# Patient Record
Sex: Female | Born: 1965
Health system: Southern US, Community
[De-identification: ages and names within clinical notes are randomized; demographics above are authoritative.]

## PROBLEM LIST (undated history)

## (undated) DIAGNOSIS — Z8619 Personal history of other infectious and parasitic diseases: Secondary | ICD-10-CM

## (undated) DIAGNOSIS — M810 Age-related osteoporosis without current pathological fracture: Secondary | ICD-10-CM

## (undated) DIAGNOSIS — K5090 Crohn's disease, unspecified, without complications: Secondary | ICD-10-CM

## (undated) DIAGNOSIS — B029 Zoster without complications: Secondary | ICD-10-CM

## (undated) DIAGNOSIS — Z9221 Personal history of antineoplastic chemotherapy: Secondary | ICD-10-CM

## (undated) DIAGNOSIS — E781 Pure hyperglyceridemia: Secondary | ICD-10-CM

## (undated) DIAGNOSIS — Z853 Personal history of malignant neoplasm of breast: Secondary | ICD-10-CM

## (undated) DIAGNOSIS — C50919 Malignant neoplasm of unspecified site of unspecified female breast: Secondary | ICD-10-CM

## (undated) DIAGNOSIS — D649 Anemia, unspecified: Secondary | ICD-10-CM

## (undated) DIAGNOSIS — R87610 Atypical squamous cells of undetermined significance on cytologic smear of cervix (ASC-US): Secondary | ICD-10-CM

## (undated) DIAGNOSIS — N950 Postmenopausal bleeding: Secondary | ICD-10-CM

## (undated) DIAGNOSIS — M8580 Other specified disorders of bone density and structure, unspecified site: Secondary | ICD-10-CM

## (undated) DIAGNOSIS — C801 Malignant (primary) neoplasm, unspecified: Secondary | ICD-10-CM

## (undated) DIAGNOSIS — M1990 Unspecified osteoarthritis, unspecified site: Secondary | ICD-10-CM

## (undated) DIAGNOSIS — Z923 Personal history of irradiation: Secondary | ICD-10-CM

## (undated) HISTORY — DX: Pure hyperglyceridemia: E78.1

## (undated) HISTORY — DX: Atypical squamous cells of undetermined significance on cytologic smear of cervix (ASC-US): R87.610

## (undated) HISTORY — PX: COLONOSCOPY: SHX174

## (undated) HISTORY — DX: Other specified disorders of bone density and structure, unspecified site: M85.80

## (undated) HISTORY — DX: Anemia, unspecified: D64.9

## (undated) HISTORY — DX: Unspecified osteoarthritis, unspecified site: M19.90

## (undated) HISTORY — PX: OTHER SURGICAL HISTORY: SHX169

## (undated) HISTORY — DX: Personal history of malignant neoplasm of breast: Z85.3

## (undated) HISTORY — PX: BREAST LUMPECTOMY: SHX2

## (undated) HISTORY — DX: Crohn's disease, unspecified, without complications: K50.90

## (undated) HISTORY — DX: Zoster without complications: B02.9

## (undated) HISTORY — DX: Age-related osteoporosis without current pathological fracture: M81.0

---

## 1998-10-22 ENCOUNTER — Other Ambulatory Visit: Admission: RE | Admit: 1998-10-22 | Discharge: 1998-10-22 | Payer: Self-pay | Admitting: Obstetrics and Gynecology

## 1999-11-01 ENCOUNTER — Other Ambulatory Visit: Admission: RE | Admit: 1999-11-01 | Discharge: 1999-11-01 | Payer: Self-pay | Admitting: Obstetrics and Gynecology

## 2001-01-08 ENCOUNTER — Other Ambulatory Visit: Admission: RE | Admit: 2001-01-08 | Discharge: 2001-01-08 | Payer: Self-pay | Admitting: Obstetrics and Gynecology

## 2001-04-17 ENCOUNTER — Ambulatory Visit (HOSPITAL_COMMUNITY): Admission: RE | Admit: 2001-04-17 | Discharge: 2001-04-17 | Payer: Self-pay | Admitting: Gastroenterology

## 2001-07-09 ENCOUNTER — Encounter (INDEPENDENT_AMBULATORY_CARE_PROVIDER_SITE_OTHER): Payer: Self-pay | Admitting: *Deleted

## 2001-07-09 ENCOUNTER — Ambulatory Visit (HOSPITAL_BASED_OUTPATIENT_CLINIC_OR_DEPARTMENT_OTHER): Admission: RE | Admit: 2001-07-09 | Discharge: 2001-07-09 | Payer: Self-pay | Admitting: *Deleted

## 2002-01-09 ENCOUNTER — Encounter: Payer: Self-pay | Admitting: Gastroenterology

## 2002-01-09 ENCOUNTER — Ambulatory Visit (HOSPITAL_COMMUNITY): Admission: RE | Admit: 2002-01-09 | Discharge: 2002-01-09 | Payer: Self-pay | Admitting: Gastroenterology

## 2002-07-10 ENCOUNTER — Encounter: Payer: Self-pay | Admitting: Gastroenterology

## 2002-07-10 ENCOUNTER — Encounter: Admission: RE | Admit: 2002-07-10 | Discharge: 2002-07-10 | Payer: Self-pay | Admitting: Gastroenterology

## 2002-07-16 ENCOUNTER — Encounter: Admission: RE | Admit: 2002-07-16 | Discharge: 2002-07-16 | Payer: Self-pay | Admitting: Gastroenterology

## 2002-07-16 ENCOUNTER — Encounter: Payer: Self-pay | Admitting: Gastroenterology

## 2002-07-23 ENCOUNTER — Encounter: Admission: RE | Admit: 2002-07-23 | Discharge: 2002-07-23 | Payer: Self-pay | Admitting: Gastroenterology

## 2002-07-23 ENCOUNTER — Encounter: Payer: Self-pay | Admitting: Gastroenterology

## 2002-08-18 ENCOUNTER — Encounter: Payer: Self-pay | Admitting: Surgery

## 2002-08-18 ENCOUNTER — Encounter: Admission: RE | Admit: 2002-08-18 | Discharge: 2002-08-18 | Payer: Self-pay | Admitting: Surgery

## 2002-09-16 ENCOUNTER — Encounter (INDEPENDENT_AMBULATORY_CARE_PROVIDER_SITE_OTHER): Payer: Self-pay

## 2002-09-16 HISTORY — PX: LAPAROSCOPIC CHOLECYSTECTOMY: SUR755

## 2002-09-17 ENCOUNTER — Inpatient Hospital Stay (HOSPITAL_COMMUNITY): Admission: EM | Admit: 2002-09-17 | Discharge: 2002-09-18 | Payer: Self-pay | Admitting: Surgery

## 2003-03-29 ENCOUNTER — Emergency Department (HOSPITAL_COMMUNITY): Admission: EM | Admit: 2003-03-29 | Discharge: 2003-03-30 | Payer: Self-pay | Admitting: Emergency Medicine

## 2003-03-30 ENCOUNTER — Encounter: Payer: Self-pay | Admitting: Emergency Medicine

## 2003-04-02 ENCOUNTER — Encounter: Payer: Self-pay | Admitting: Gastroenterology

## 2003-04-02 ENCOUNTER — Encounter: Admission: RE | Admit: 2003-04-02 | Discharge: 2003-04-02 | Payer: Self-pay | Admitting: Gastroenterology

## 2003-04-10 ENCOUNTER — Encounter: Admission: RE | Admit: 2003-04-10 | Discharge: 2003-04-10 | Payer: Self-pay | Admitting: Gastroenterology

## 2003-04-10 ENCOUNTER — Emergency Department (HOSPITAL_COMMUNITY): Admission: EM | Admit: 2003-04-10 | Discharge: 2003-04-10 | Payer: Self-pay | Admitting: Emergency Medicine

## 2003-04-10 ENCOUNTER — Encounter: Payer: Self-pay | Admitting: Gastroenterology

## 2003-04-14 ENCOUNTER — Inpatient Hospital Stay (HOSPITAL_COMMUNITY): Admission: RE | Admit: 2003-04-14 | Discharge: 2003-04-19 | Payer: Self-pay | Admitting: General Surgery

## 2003-04-14 ENCOUNTER — Encounter (INDEPENDENT_AMBULATORY_CARE_PROVIDER_SITE_OTHER): Payer: Self-pay

## 2003-08-01 DIAGNOSIS — K5090 Crohn's disease, unspecified, without complications: Secondary | ICD-10-CM | POA: Insufficient documentation

## 2003-11-21 DIAGNOSIS — K5090 Crohn's disease, unspecified, without complications: Secondary | ICD-10-CM

## 2003-11-21 HISTORY — DX: Crohn's disease, unspecified, without complications: K50.90

## 2004-02-16 ENCOUNTER — Other Ambulatory Visit: Admission: RE | Admit: 2004-02-16 | Discharge: 2004-02-16 | Payer: Self-pay | Admitting: Obstetrics and Gynecology

## 2005-02-27 ENCOUNTER — Other Ambulatory Visit: Admission: RE | Admit: 2005-02-27 | Discharge: 2005-02-27 | Payer: Self-pay | Admitting: Obstetrics and Gynecology

## 2005-08-24 ENCOUNTER — Encounter: Admission: RE | Admit: 2005-08-24 | Discharge: 2005-08-24 | Payer: Self-pay | Admitting: Obstetrics and Gynecology

## 2005-09-05 ENCOUNTER — Encounter: Admission: RE | Admit: 2005-09-05 | Discharge: 2005-09-05 | Payer: Self-pay | Admitting: Obstetrics and Gynecology

## 2005-11-20 HISTORY — PX: BREAST LUMPECTOMY: SHX2

## 2006-02-22 ENCOUNTER — Encounter: Admission: RE | Admit: 2006-02-22 | Discharge: 2006-02-22 | Payer: Self-pay | Admitting: Obstetrics and Gynecology

## 2006-02-22 ENCOUNTER — Encounter (INDEPENDENT_AMBULATORY_CARE_PROVIDER_SITE_OTHER): Payer: Self-pay | Admitting: *Deleted

## 2006-02-22 ENCOUNTER — Encounter (INDEPENDENT_AMBULATORY_CARE_PROVIDER_SITE_OTHER): Payer: Self-pay | Admitting: Radiology

## 2006-02-22 DIAGNOSIS — C801 Malignant (primary) neoplasm, unspecified: Secondary | ICD-10-CM

## 2006-02-22 HISTORY — DX: Malignant (primary) neoplasm, unspecified: C80.1

## 2006-03-03 ENCOUNTER — Encounter: Admission: RE | Admit: 2006-03-03 | Discharge: 2006-03-03 | Payer: Self-pay | Admitting: Obstetrics and Gynecology

## 2006-03-07 ENCOUNTER — Encounter: Admission: RE | Admit: 2006-03-07 | Discharge: 2006-03-07 | Payer: Self-pay | Admitting: General Surgery

## 2006-03-12 ENCOUNTER — Encounter: Admission: RE | Admit: 2006-03-12 | Discharge: 2006-03-12 | Payer: Self-pay | Admitting: General Surgery

## 2006-03-12 ENCOUNTER — Encounter (INDEPENDENT_AMBULATORY_CARE_PROVIDER_SITE_OTHER): Payer: Self-pay | Admitting: *Deleted

## 2006-03-12 ENCOUNTER — Ambulatory Visit (HOSPITAL_BASED_OUTPATIENT_CLINIC_OR_DEPARTMENT_OTHER): Admission: RE | Admit: 2006-03-12 | Discharge: 2006-03-13 | Payer: Self-pay | Admitting: General Surgery

## 2006-03-12 HISTORY — PX: OTHER SURGICAL HISTORY: SHX169

## 2006-03-13 ENCOUNTER — Ambulatory Visit: Payer: Self-pay | Admitting: Oncology

## 2006-03-13 DIAGNOSIS — Z853 Personal history of malignant neoplasm of breast: Secondary | ICD-10-CM

## 2006-03-13 HISTORY — DX: Personal history of malignant neoplasm of breast: Z85.3

## 2006-03-28 LAB — CBC WITH DIFFERENTIAL/PLATELET
BASO%: 0.5 % (ref 0.0–2.0)
Basophils Absolute: 0 10*3/uL (ref 0.0–0.1)
EOS%: 6.2 % (ref 0.0–7.0)
Eosinophils Absolute: 0.2 10*3/uL (ref 0.0–0.5)
HCT: 33.4 % — ABNORMAL LOW (ref 34.8–46.6)
HGB: 12 g/dL (ref 11.6–15.9)
LYMPH%: 22.9 % (ref 14.0–48.0)
MCH: 38.5 pg — ABNORMAL HIGH (ref 26.0–34.0)
MCHC: 35.8 g/dL (ref 32.0–36.0)
MCV: 107.5 fL — ABNORMAL HIGH (ref 81.0–101.0)
MONO#: 0.2 10*3/uL (ref 0.1–0.9)
MONO%: 6.8 % (ref 0.0–13.0)
NEUT#: 1.8 10*3/uL (ref 1.5–6.5)
NEUT%: 63.6 % (ref 39.6–76.8)
Platelets: 284 10*3/uL (ref 145–400)
RBC: 3.11 10*6/uL — ABNORMAL LOW (ref 3.70–5.32)
RDW: 13.8 % (ref 11.3–14.5)
WBC: 2.8 10*3/uL — ABNORMAL LOW (ref 3.9–10.0)
lymph#: 0.6 10*3/uL — ABNORMAL LOW (ref 0.9–3.3)

## 2006-03-28 LAB — COMPREHENSIVE METABOLIC PANEL
ALT: 15 U/L (ref 0–40)
AST: 15 U/L (ref 0–37)
Albumin: 4.5 g/dL (ref 3.5–5.2)
Alkaline Phosphatase: 48 U/L (ref 39–117)
BUN: 12 mg/dL (ref 6–23)
CO2: 26 mEq/L (ref 19–32)
Calcium: 9.8 mg/dL (ref 8.4–10.5)
Chloride: 103 mEq/L (ref 96–112)
Creatinine, Ser: 0.7 mg/dL (ref 0.4–1.2)
Glucose, Bld: 89 mg/dL (ref 70–99)
Potassium: 4.4 mEq/L (ref 3.5–5.3)
Sodium: 139 mEq/L (ref 135–145)
Total Bilirubin: 1.1 mg/dL (ref 0.3–1.2)
Total Protein: 7.3 g/dL (ref 6.0–8.3)

## 2006-03-28 LAB — CANCER ANTIGEN 27.29: CA 27.29: 21 U/mL (ref 0–39)

## 2006-03-28 LAB — LACTATE DEHYDROGENASE: LDH: 69 U/L — ABNORMAL LOW (ref 94–250)

## 2006-03-29 ENCOUNTER — Other Ambulatory Visit: Admission: RE | Admit: 2006-03-29 | Discharge: 2006-03-29 | Payer: Self-pay | Admitting: Obstetrics and Gynecology

## 2006-04-05 ENCOUNTER — Encounter (INDEPENDENT_AMBULATORY_CARE_PROVIDER_SITE_OTHER): Payer: Self-pay | Admitting: *Deleted

## 2006-04-05 ENCOUNTER — Ambulatory Visit: Admission: RE | Admit: 2006-04-05 | Discharge: 2006-04-05 | Payer: Self-pay | Admitting: Oncology

## 2006-04-05 ENCOUNTER — Ambulatory Visit (HOSPITAL_COMMUNITY): Admission: RE | Admit: 2006-04-05 | Discharge: 2006-04-05 | Payer: Self-pay | Admitting: Oncology

## 2006-04-05 HISTORY — PX: TRANSTHORACIC ECHOCARDIOGRAM: SHX275

## 2006-04-10 LAB — CBC WITH DIFFERENTIAL/PLATELET
BASO%: 0.3 % (ref 0.0–2.0)
Basophils Absolute: 0 10*3/uL (ref 0.0–0.1)
EOS%: 3.8 % (ref 0.0–7.0)
Eosinophils Absolute: 0.1 10*3/uL (ref 0.0–0.5)
HCT: 35.8 % (ref 34.8–46.6)
HGB: 12.8 g/dL (ref 11.6–15.9)
LYMPH%: 24.3 % (ref 14.0–48.0)
MCH: 37.8 pg — ABNORMAL HIGH (ref 26.0–34.0)
MCHC: 35.6 g/dL (ref 32.0–36.0)
MCV: 106.1 fL — ABNORMAL HIGH (ref 81.0–101.0)
MONO#: 0.3 10*3/uL (ref 0.1–0.9)
MONO%: 8.3 % (ref 0.0–13.0)
NEUT#: 2.3 10*3/uL (ref 1.5–6.5)
NEUT%: 63.3 % (ref 39.6–76.8)
Platelets: 210 10*3/uL (ref 145–400)
RBC: 3.37 10*6/uL — ABNORMAL LOW (ref 3.70–5.32)
RDW: 13.6 % (ref 11.3–14.5)
WBC: 3.6 10*3/uL — ABNORMAL LOW (ref 3.9–10.0)
lymph#: 0.9 10*3/uL (ref 0.9–3.3)

## 2006-04-10 LAB — COMPREHENSIVE METABOLIC PANEL
ALT: 28 U/L (ref 0–40)
AST: 24 U/L (ref 0–37)
Albumin: 4.7 g/dL (ref 3.5–5.2)
Alkaline Phosphatase: 64 U/L (ref 39–117)
BUN: 10 mg/dL (ref 6–23)
CO2: 28 mEq/L (ref 19–32)
Calcium: 9.4 mg/dL (ref 8.4–10.5)
Chloride: 104 mEq/L (ref 96–112)
Creatinine, Ser: 0.7 mg/dL (ref 0.4–1.2)
Glucose, Bld: 90 mg/dL (ref 70–99)
Potassium: 4.2 mEq/L (ref 3.5–5.3)
Sodium: 139 mEq/L (ref 135–145)
Total Bilirubin: 1 mg/dL (ref 0.3–1.2)
Total Protein: 7.6 g/dL (ref 6.0–8.3)

## 2006-04-10 LAB — CANCER ANTIGEN 27.29: CA 27.29: 22 U/mL (ref 0–39)

## 2006-04-10 LAB — LACTATE DEHYDROGENASE: LDH: 85 U/L — ABNORMAL LOW (ref 94–250)

## 2006-04-12 ENCOUNTER — Ambulatory Visit (HOSPITAL_BASED_OUTPATIENT_CLINIC_OR_DEPARTMENT_OTHER): Admission: RE | Admit: 2006-04-12 | Discharge: 2006-04-12 | Payer: Self-pay | Admitting: General Surgery

## 2006-04-23 LAB — CBC WITH DIFFERENTIAL/PLATELET
BASO%: 1 % (ref 0.0–2.0)
Basophils Absolute: 0 10*3/uL (ref 0.0–0.1)
EOS%: 11.5 % — ABNORMAL HIGH (ref 0.0–7.0)
Eosinophils Absolute: 0.1 10*3/uL (ref 0.0–0.5)
HCT: 30.9 % — ABNORMAL LOW (ref 34.8–46.6)
HGB: 11.1 g/dL — ABNORMAL LOW (ref 11.6–15.9)
LYMPH%: 51.3 % — ABNORMAL HIGH (ref 14.0–48.0)
MCH: 37.3 pg — ABNORMAL HIGH (ref 26.0–34.0)
MCHC: 35.9 g/dL (ref 32.0–36.0)
MCV: 103.9 fL — ABNORMAL HIGH (ref 81.0–101.0)
MONO#: 0 10*3/uL — ABNORMAL LOW (ref 0.1–0.9)
MONO%: 7.1 % (ref 0.0–13.0)
NEUT#: 0.2 10*3/uL — CL (ref 1.5–6.5)
NEUT%: 29.1 % — ABNORMAL LOW (ref 39.6–76.8)
Platelets: 75 10*3/uL — ABNORMAL LOW (ref 145–400)
RBC: 2.97 10*6/uL — ABNORMAL LOW (ref 3.70–5.32)
RDW: 12.4 % (ref 11.3–14.5)
WBC: 0.6 10*3/uL — CL (ref 3.9–10.0)
lymph#: 0.3 10*3/uL — ABNORMAL LOW (ref 0.9–3.3)

## 2006-04-30 ENCOUNTER — Ambulatory Visit: Payer: Self-pay | Admitting: Oncology

## 2006-04-30 LAB — CBC WITH DIFFERENTIAL/PLATELET
BASO%: 0.3 % (ref 0.0–2.0)
Basophils Absolute: 0 10*3/uL (ref 0.0–0.1)
EOS%: 2.5 % (ref 0.0–7.0)
Eosinophils Absolute: 0.1 10*3/uL (ref 0.0–0.5)
HCT: 30.6 % — ABNORMAL LOW (ref 34.8–46.6)
HGB: 11 g/dL — ABNORMAL LOW (ref 11.6–15.9)
LYMPH%: 13.8 % — ABNORMAL LOW (ref 14.0–48.0)
MCH: 36.8 pg — ABNORMAL HIGH (ref 26.0–34.0)
MCHC: 35.9 g/dL (ref 32.0–36.0)
MCV: 102.3 fL — ABNORMAL HIGH (ref 81.0–101.0)
MONO#: 0.4 10*3/uL (ref 0.1–0.9)
MONO%: 7.9 % (ref 0.0–13.0)
NEUT#: 3.8 10*3/uL (ref 1.5–6.5)
NEUT%: 75.5 % (ref 39.6–76.8)
Platelets: 164 10*3/uL (ref 145–400)
RBC: 2.99 10*6/uL — ABNORMAL LOW (ref 3.70–5.32)
RDW: 12.2 % (ref 11.3–14.5)
WBC: 5.1 10*3/uL (ref 3.9–10.0)
lymph#: 0.7 10*3/uL — ABNORMAL LOW (ref 0.9–3.3)

## 2006-05-07 LAB — CBC WITH DIFFERENTIAL/PLATELET
BASO%: 1.2 % (ref 0.0–2.0)
Basophils Absolute: 0 10*3/uL (ref 0.0–0.1)
EOS%: 5.3 % (ref 0.0–7.0)
Eosinophils Absolute: 0 10*3/uL (ref 0.0–0.5)
HCT: 27.5 % — ABNORMAL LOW (ref 34.8–46.6)
HGB: 10 g/dL — ABNORMAL LOW (ref 11.6–15.9)
LYMPH%: 35.2 % (ref 14.0–48.0)
MCH: 37.1 pg — ABNORMAL HIGH (ref 26.0–34.0)
MCHC: 36.4 g/dL — ABNORMAL HIGH (ref 32.0–36.0)
MCV: 101.7 fL — ABNORMAL HIGH (ref 81.0–101.0)
MONO#: 0.1 10*3/uL (ref 0.1–0.9)
MONO%: 9.6 % (ref 0.0–13.0)
NEUT#: 0.3 10*3/uL — CL (ref 1.5–6.5)
NEUT%: 48.7 % (ref 39.6–76.8)
Platelets: 178 10*3/uL (ref 145–400)
RBC: 2.71 10*6/uL — ABNORMAL LOW (ref 3.70–5.32)
RDW: 12.7 % (ref 11.3–14.5)
WBC: 0.7 10*3/uL — CL (ref 3.9–10.0)
lymph#: 0.2 10*3/uL — ABNORMAL LOW (ref 0.9–3.3)

## 2006-05-14 LAB — CBC WITH DIFFERENTIAL/PLATELET
BASO%: 0 % (ref 0.0–2.0)
Basophils Absolute: 0 10*3/uL (ref 0.0–0.1)
EOS%: 0.3 % (ref 0.0–7.0)
Eosinophils Absolute: 0 10*3/uL (ref 0.0–0.5)
HCT: 24.3 % — ABNORMAL LOW (ref 34.8–46.6)
HGB: 8.8 g/dL — ABNORMAL LOW (ref 11.6–15.9)
LYMPH%: 8.7 % — ABNORMAL LOW (ref 14.0–48.0)
MCH: 37.1 pg — ABNORMAL HIGH (ref 26.0–34.0)
MCHC: 36.2 g/dL — ABNORMAL HIGH (ref 32.0–36.0)
MCV: 102.4 fL — ABNORMAL HIGH (ref 81.0–101.0)
MONO#: 0.4 10*3/uL (ref 0.1–0.9)
MONO%: 6.1 % (ref 0.0–13.0)
NEUT#: 5.8 10*3/uL (ref 1.5–6.5)
NEUT%: 84.9 % — ABNORMAL HIGH (ref 39.6–76.8)
Platelets: 98 10*3/uL — ABNORMAL LOW (ref 145–400)
RBC: 2.37 10*6/uL — ABNORMAL LOW (ref 3.70–5.32)
RDW: 12.4 % (ref 11.3–14.5)
WBC: 6.8 10*3/uL (ref 3.9–10.0)
lymph#: 0.6 10*3/uL — ABNORMAL LOW (ref 0.9–3.3)

## 2006-05-15 LAB — CBC WITH DIFFERENTIAL/PLATELET
BASO%: 0.2 % (ref 0.0–2.0)
Basophils Absolute: 0 10*3/uL (ref 0.0–0.1)
EOS%: 0.3 % (ref 0.0–7.0)
Eosinophils Absolute: 0 10*3/uL (ref 0.0–0.5)
HCT: 25.3 % — ABNORMAL LOW (ref 34.8–46.6)
HGB: 9.1 g/dL — ABNORMAL LOW (ref 11.6–15.9)
LYMPH%: 8.4 % — ABNORMAL LOW (ref 14.0–48.0)
MCH: 36.4 pg — ABNORMAL HIGH (ref 26.0–34.0)
MCHC: 35.8 g/dL (ref 32.0–36.0)
MCV: 101.9 fL — ABNORMAL HIGH (ref 81.0–101.0)
MONO#: 0.3 10*3/uL (ref 0.1–0.9)
MONO%: 5.7 % (ref 0.0–13.0)
NEUT#: 5.1 10*3/uL (ref 1.5–6.5)
NEUT%: 85.4 % — ABNORMAL HIGH (ref 39.6–76.8)
Platelets: 112 10*3/uL — ABNORMAL LOW (ref 145–400)
RBC: 2.48 10*6/uL — ABNORMAL LOW (ref 3.70–5.32)
RDW: 12.4 % (ref 11.3–14.5)
WBC: 6 10*3/uL (ref 3.9–10.0)
lymph#: 0.5 10*3/uL — ABNORMAL LOW (ref 0.9–3.3)

## 2006-05-21 LAB — CBC WITH DIFFERENTIAL/PLATELET
BASO%: 0.6 % (ref 0.0–2.0)
Basophils Absolute: 0 10*3/uL (ref 0.0–0.1)
EOS%: 1.5 % (ref 0.0–7.0)
Eosinophils Absolute: 0 10*3/uL (ref 0.0–0.5)
HCT: UNDETERMINED % (ref 34.8–46.6)
HGB: 8.5 g/dL — ABNORMAL LOW (ref 11.6–15.9)
LYMPH%: 19.6 % (ref 14.0–48.0)
MCH: UNDETERMINED pg (ref 26.0–34.0)
MCHC: UNDETERMINED g/dL (ref 32.0–36.0)
MCV: UNDETERMINED fL (ref 81.0–101.0)
MONO#: 0 10*3/uL — ABNORMAL LOW (ref 0.1–0.9)
MONO%: 5.3 % (ref 0.0–13.0)
NEUT#: 0.7 10*3/uL — ABNORMAL LOW (ref 1.5–6.5)
NEUT%: 73 % (ref 39.6–76.8)
Platelets: 141 10*3/uL — ABNORMAL LOW (ref 145–400)
RBC: UNDETERMINED 10*6/uL (ref 3.70–5.32)
RDW: UNDETERMINED % (ref 11.3–14.5)
WBC: 0.9 10*3/uL — CL (ref 3.9–10.0)
lymph#: 0.2 10*3/uL — ABNORMAL LOW (ref 0.9–3.3)

## 2006-05-28 LAB — CBC WITH DIFFERENTIAL/PLATELET
BASO%: 1.2 % (ref 0.0–2.0)
Basophils Absolute: 0.1 10*3/uL (ref 0.0–0.1)
EOS%: 0.5 % (ref 0.0–7.0)
Eosinophils Absolute: 0 10*3/uL (ref 0.0–0.5)
HCT: 24.2 % — ABNORMAL LOW (ref 34.8–46.6)
HGB: 8.8 g/dL — ABNORMAL LOW (ref 11.6–15.9)
LYMPH%: 8.4 % — ABNORMAL LOW (ref 14.0–48.0)
MCH: 36.3 pg — ABNORMAL HIGH (ref 26.0–34.0)
MCHC: 36.5 g/dL — ABNORMAL HIGH (ref 32.0–36.0)
MCV: 99.3 fL (ref 81.0–101.0)
MONO#: 0.6 10*3/uL (ref 0.1–0.9)
MONO%: 8.7 % (ref 0.0–13.0)
NEUT#: 5.6 10*3/uL (ref 1.5–6.5)
NEUT%: 81.2 % — ABNORMAL HIGH (ref 39.6–76.8)
Platelets: 137 10*3/uL — ABNORMAL LOW (ref 145–400)
RBC: 2.43 10*6/uL — ABNORMAL LOW (ref 3.70–5.32)
RDW: 17.8 % — ABNORMAL HIGH (ref 11.3–14.5)
WBC: 6.9 10*3/uL (ref 3.9–10.0)
lymph#: 0.6 10*3/uL — ABNORMAL LOW (ref 0.9–3.3)

## 2006-05-28 LAB — TECHNOLOGIST REVIEW: Technologist Review: 1

## 2006-06-04 LAB — CBC WITH DIFFERENTIAL/PLATELET
BASO%: 0 % (ref 0.0–2.0)
Basophils Absolute: 0 10*3/uL (ref 0.0–0.1)
EOS%: 0.9 % (ref 0.0–7.0)
Eosinophils Absolute: 0 10*3/uL (ref 0.0–0.5)
HCT: 25 % — ABNORMAL LOW (ref 34.8–46.6)
HGB: 9 g/dL — ABNORMAL LOW (ref 11.6–15.9)
LYMPH%: 6.7 % — ABNORMAL LOW (ref 14.0–48.0)
MCH: 36.8 pg — ABNORMAL HIGH (ref 26.0–34.0)
MCHC: 36 g/dL (ref 32.0–36.0)
MCV: 102.1 fL — ABNORMAL HIGH (ref 81.0–101.0)
MONO#: 0 10*3/uL — ABNORMAL LOW (ref 0.1–0.9)
MONO%: 1.4 % (ref 0.0–13.0)
NEUT#: 2 10*3/uL (ref 1.5–6.5)
NEUT%: 91 % — ABNORMAL HIGH (ref 39.6–76.8)
Platelets: 121 10*3/uL — ABNORMAL LOW (ref 145–400)
RBC: 2.45 10*6/uL — ABNORMAL LOW (ref 3.70–5.32)
RDW: 17.7 % — ABNORMAL HIGH (ref 11.3–14.5)
WBC: 2.2 10*3/uL — ABNORMAL LOW (ref 3.9–10.0)
lymph#: 0.1 10*3/uL — ABNORMAL LOW (ref 0.9–3.3)

## 2006-06-06 LAB — FOLLICLE STIMULATING HORMONE: FSH: 22.3 m[IU]/mL

## 2006-06-06 LAB — COMPREHENSIVE METABOLIC PANEL
ALT: 12 U/L (ref 0–40)
AST: 15 U/L (ref 0–37)
Albumin: 4.4 g/dL (ref 3.5–5.2)
Alkaline Phosphatase: 77 U/L (ref 39–117)
BUN: 11 mg/dL (ref 6–23)
CO2: 27 mEq/L (ref 19–32)
Calcium: 9.3 mg/dL (ref 8.4–10.5)
Chloride: 105 mEq/L (ref 96–112)
Creatinine, Ser: 0.66 mg/dL (ref 0.40–1.20)
Glucose, Bld: 95 mg/dL (ref 70–99)
Potassium: 4.2 mEq/L (ref 3.5–5.3)
Sodium: 139 mEq/L (ref 135–145)
Total Bilirubin: 0.4 mg/dL (ref 0.3–1.2)
Total Protein: 6.9 g/dL (ref 6.0–8.3)

## 2006-06-06 LAB — ESTRADIOL, ULTRA SENS: Estradiol, Ultra Sensitive: 107 pg/mL

## 2006-06-18 ENCOUNTER — Ambulatory Visit: Payer: Self-pay | Admitting: Oncology

## 2006-06-18 LAB — CBC WITH DIFFERENTIAL/PLATELET
BASO%: 1.7 % (ref 0.0–2.0)
Basophils Absolute: 0.1 10*3/uL (ref 0.0–0.1)
EOS%: 1.5 % (ref 0.0–7.0)
Eosinophils Absolute: 0 10*3/uL (ref 0.0–0.5)
HCT: 27.2 % — ABNORMAL LOW (ref 34.8–46.6)
HGB: 9.4 g/dL — ABNORMAL LOW (ref 11.6–15.9)
LYMPH%: 7.7 % — ABNORMAL LOW (ref 14.0–48.0)
MCH: 35.1 pg — ABNORMAL HIGH (ref 26.0–34.0)
MCHC: 34.6 g/dL (ref 32.0–36.0)
MCV: 101 fL (ref 81.0–101.0)
MONO#: 0.7 10*3/uL (ref 0.1–0.9)
MONO%: 20.5 % — ABNORMAL HIGH (ref 0.0–13.0)
NEUT#: 2.2 10*3/uL (ref 1.5–6.5)
NEUT%: 68.6 % (ref 39.6–76.8)
Platelets: 157 10*3/uL (ref 145–400)
RBC: 2.68 10*6/uL — ABNORMAL LOW (ref 3.70–5.32)
RDW: 16.7 % — ABNORMAL HIGH (ref 11.3–14.5)
WBC: 3.2 10*3/uL — ABNORMAL LOW (ref 3.9–10.0)
lymph#: 0.2 10*3/uL — ABNORMAL LOW (ref 0.9–3.3)

## 2006-06-25 LAB — CBC WITH DIFFERENTIAL/PLATELET
BASO%: 1.5 % (ref 0.0–2.0)
Basophils Absolute: 0 10*3/uL (ref 0.0–0.1)
EOS%: 3.5 % (ref 0.0–7.0)
Eosinophils Absolute: 0.1 10*3/uL (ref 0.0–0.5)
HCT: 28.9 % — ABNORMAL LOW (ref 34.8–46.6)
HGB: 10.1 g/dL — ABNORMAL LOW (ref 11.6–15.9)
LYMPH%: 15.8 % (ref 14.0–48.0)
MCH: 34.9 pg — ABNORMAL HIGH (ref 26.0–34.0)
MCHC: 34.9 g/dL (ref 32.0–36.0)
MCV: 99.9 fL (ref 81.0–101.0)
MONO#: 0.3 10*3/uL (ref 0.1–0.9)
MONO%: 11.6 % (ref 0.0–13.0)
NEUT#: 1.9 10*3/uL (ref 1.5–6.5)
NEUT%: 67.6 % (ref 39.6–76.8)
Platelets: 170 10*3/uL (ref 145–400)
RBC: 2.89 10*6/uL — ABNORMAL LOW (ref 3.70–5.32)
RDW: 14.8 % — ABNORMAL HIGH (ref 11.3–14.5)
WBC: 2.8 10*3/uL — ABNORMAL LOW (ref 3.9–10.0)
lymph#: 0.4 10*3/uL — ABNORMAL LOW (ref 0.9–3.3)

## 2006-07-02 LAB — CBC WITH DIFFERENTIAL/PLATELET
BASO%: 2 % (ref 0.0–2.0)
Basophils Absolute: 0.1 10*3/uL (ref 0.0–0.1)
EOS%: 7 % (ref 0.0–7.0)
Eosinophils Absolute: 0.2 10*3/uL (ref 0.0–0.5)
HCT: 31.9 % — ABNORMAL LOW (ref 34.8–46.6)
HGB: 10.9 g/dL — ABNORMAL LOW (ref 11.6–15.9)
LYMPH%: 13.8 % — ABNORMAL LOW (ref 14.0–48.0)
MCH: 33.9 pg (ref 26.0–34.0)
MCHC: 34 g/dL (ref 32.0–36.0)
MCV: 99.6 fL (ref 81.0–101.0)
MONO#: 0.2 10*3/uL (ref 0.1–0.9)
MONO%: 8.1 % (ref 0.0–13.0)
NEUT#: 2.1 10*3/uL (ref 1.5–6.5)
NEUT%: 69.1 % (ref 39.6–76.8)
Platelets: 143 10*3/uL — ABNORMAL LOW (ref 145–400)
RBC: 3.21 10*6/uL — ABNORMAL LOW (ref 3.70–5.32)
RDW: 13.9 % (ref 11.3–14.5)
WBC: 3 10*3/uL — ABNORMAL LOW (ref 3.9–10.0)
lymph#: 0.4 10*3/uL — ABNORMAL LOW (ref 0.9–3.3)

## 2006-07-09 LAB — CBC WITH DIFFERENTIAL/PLATELET
BASO%: 0.4 % (ref 0.0–2.0)
Basophils Absolute: 0 10*3/uL (ref 0.0–0.1)
EOS%: 5.7 % (ref 0.0–7.0)
Eosinophils Absolute: 0.2 10*3/uL (ref 0.0–0.5)
HCT: 30.1 % — ABNORMAL LOW (ref 34.8–46.6)
HGB: 10.6 g/dL — ABNORMAL LOW (ref 11.6–15.9)
LYMPH%: 11.9 % — ABNORMAL LOW (ref 14.0–48.0)
MCH: 35 pg — ABNORMAL HIGH (ref 26.0–34.0)
MCHC: 35 g/dL (ref 32.0–36.0)
MCV: 99.8 fL (ref 81.0–101.0)
MONO#: 0.1 10*3/uL (ref 0.1–0.9)
MONO%: 4.6 % (ref 0.0–13.0)
NEUT#: 2.1 10*3/uL (ref 1.5–6.5)
NEUT%: 77.4 % — ABNORMAL HIGH (ref 39.6–76.8)
Platelets: 119 10*3/uL — ABNORMAL LOW (ref 145–400)
RBC: 3.02 10*6/uL — ABNORMAL LOW (ref 3.70–5.32)
RDW: 16.9 % — ABNORMAL HIGH (ref 11.3–14.5)
WBC: 2.7 10*3/uL — ABNORMAL LOW (ref 3.9–10.0)
lymph#: 0.3 10*3/uL — ABNORMAL LOW (ref 0.9–3.3)

## 2006-07-16 LAB — CBC WITH DIFFERENTIAL/PLATELET
BASO%: 0.7 % (ref 0.0–2.0)
Basophils Absolute: 0 10*3/uL (ref 0.0–0.1)
EOS%: 8.7 % — ABNORMAL HIGH (ref 0.0–7.0)
Eosinophils Absolute: 0.2 10*3/uL (ref 0.0–0.5)
HCT: 33.3 % — ABNORMAL LOW (ref 34.8–46.6)
HGB: 11.7 g/dL (ref 11.6–15.9)
LYMPH%: 17.4 % (ref 14.0–48.0)
MCH: 34.7 pg — ABNORMAL HIGH (ref 26.0–34.0)
MCHC: 35.1 g/dL (ref 32.0–36.0)
MCV: 98.8 fL (ref 81.0–101.0)
MONO#: 0.2 10*3/uL (ref 0.1–0.9)
MONO%: 8.4 % (ref 0.0–13.0)
NEUT#: 1.4 10*3/uL — ABNORMAL LOW (ref 1.5–6.5)
NEUT%: 64.8 % (ref 39.6–76.8)
Platelets: 142 10*3/uL — ABNORMAL LOW (ref 145–400)
RBC: 3.37 10*6/uL — ABNORMAL LOW (ref 3.70–5.32)
RDW: 16.6 % — ABNORMAL HIGH (ref 11.3–14.5)
WBC: 2.1 10*3/uL — ABNORMAL LOW (ref 3.9–10.0)
lymph#: 0.4 10*3/uL — ABNORMAL LOW (ref 0.9–3.3)

## 2006-07-24 LAB — CBC WITH DIFFERENTIAL/PLATELET
BASO%: 0.8 % (ref 0.0–2.0)
Basophils Absolute: 0 10*3/uL (ref 0.0–0.1)
EOS%: 6.8 % (ref 0.0–7.0)
Eosinophils Absolute: 0.2 10*3/uL (ref 0.0–0.5)
HCT: 35 % (ref 34.8–46.6)
HGB: 12.2 g/dL (ref 11.6–15.9)
LYMPH%: 19.2 % (ref 14.0–48.0)
MCH: 33.6 pg (ref 26.0–34.0)
MCHC: 34.7 g/dL (ref 32.0–36.0)
MCV: 96.6 fL (ref 81.0–101.0)
MONO#: 0.2 10*3/uL (ref 0.1–0.9)
MONO%: 9.7 % (ref 0.0–13.0)
NEUT#: 1.4 10*3/uL — ABNORMAL LOW (ref 1.5–6.5)
NEUT%: 63.6 % (ref 39.6–76.8)
Platelets: 145 10*3/uL (ref 145–400)
RBC: 3.63 10*6/uL — ABNORMAL LOW (ref 3.70–5.32)
RDW: 13.5 % (ref 11.3–14.5)
WBC: 2.2 10*3/uL — ABNORMAL LOW (ref 3.9–10.0)
lymph#: 0.4 10*3/uL — ABNORMAL LOW (ref 0.9–3.3)

## 2006-07-30 LAB — CBC WITH DIFFERENTIAL/PLATELET
BASO%: 1.3 % (ref 0.0–2.0)
Basophils Absolute: 0 10*3/uL (ref 0.0–0.1)
EOS%: 3.9 % (ref 0.0–7.0)
Eosinophils Absolute: 0.1 10*3/uL (ref 0.0–0.5)
HCT: 33.4 % — ABNORMAL LOW (ref 34.8–46.6)
HGB: 11.9 g/dL (ref 11.6–15.9)
LYMPH%: 22.3 % (ref 14.0–48.0)
MCH: 33.7 pg (ref 26.0–34.0)
MCHC: 35.7 g/dL (ref 32.0–36.0)
MCV: 94.6 fL (ref 81.0–101.0)
MONO#: 0.1 10*3/uL (ref 0.1–0.9)
MONO%: 5.5 % (ref 0.0–13.0)
NEUT#: 1.3 10*3/uL — ABNORMAL LOW (ref 1.5–6.5)
NEUT%: 67 % (ref 39.6–76.8)
Platelets: 116 10*3/uL — ABNORMAL LOW (ref 145–400)
RBC: 3.53 10*6/uL — ABNORMAL LOW (ref 3.70–5.32)
RDW: 12.6 % (ref 11.3–14.5)
WBC: 2 10*3/uL — ABNORMAL LOW (ref 3.9–10.0)
lymph#: 0.4 10*3/uL — ABNORMAL LOW (ref 0.9–3.3)

## 2006-08-03 ENCOUNTER — Ambulatory Visit: Payer: Self-pay | Admitting: Oncology

## 2006-08-06 LAB — CBC WITH DIFFERENTIAL/PLATELET
BASO%: 1.4 % (ref 0.0–2.0)
Basophils Absolute: 0 10*3/uL (ref 0.0–0.1)
EOS%: 3.9 % (ref 0.0–7.0)
Eosinophils Absolute: 0.1 10*3/uL (ref 0.0–0.5)
HCT: 36.5 % (ref 34.8–46.6)
HGB: 12.8 g/dL (ref 11.6–15.9)
LYMPH%: 19.4 % (ref 14.0–48.0)
MCH: 33.1 pg (ref 26.0–34.0)
MCHC: 35.2 g/dL (ref 32.0–36.0)
MCV: 94.2 fL (ref 81.0–101.0)
MONO#: 0.2 10*3/uL (ref 0.1–0.9)
MONO%: 8.5 % (ref 0.0–13.0)
NEUT#: 1.6 10*3/uL (ref 1.5–6.5)
NEUT%: 66.8 % (ref 39.6–76.8)
Platelets: 147 10*3/uL (ref 145–400)
RBC: 3.87 10*6/uL (ref 3.70–5.32)
RDW: 13 % (ref 11.3–14.5)
WBC: 2.4 10*3/uL — ABNORMAL LOW (ref 3.9–10.0)
lymph#: 0.5 10*3/uL — ABNORMAL LOW (ref 0.9–3.3)

## 2006-08-13 LAB — CBC WITH DIFFERENTIAL/PLATELET
BASO%: 1.4 % (ref 0.0–2.0)
Basophils Absolute: 0 10*3/uL (ref 0.0–0.1)
EOS%: 3 % (ref 0.0–7.0)
Eosinophils Absolute: 0.1 10*3/uL (ref 0.0–0.5)
HCT: 35.1 % (ref 34.8–46.6)
HGB: 12.7 g/dL (ref 11.6–15.9)
LYMPH%: 21.4 % (ref 14.0–48.0)
MCH: 33.1 pg (ref 26.0–34.0)
MCHC: 36.1 g/dL — ABNORMAL HIGH (ref 32.0–36.0)
MCV: 91.6 fL (ref 81.0–101.0)
MONO#: 0.2 10*3/uL (ref 0.1–0.9)
MONO%: 7.3 % (ref 0.0–13.0)
NEUT#: 1.5 10*3/uL (ref 1.5–6.5)
NEUT%: 67 % (ref 39.6–76.8)
Platelets: 137 10*3/uL — ABNORMAL LOW (ref 145–400)
RBC: 3.83 10*6/uL (ref 3.70–5.32)
RDW: 12.9 % (ref 11.3–14.5)
WBC: 2.2 10*3/uL — ABNORMAL LOW (ref 3.9–10.0)
lymph#: 0.5 10*3/uL — ABNORMAL LOW (ref 0.9–3.3)

## 2006-08-20 LAB — CBC WITH DIFFERENTIAL/PLATELET
BASO%: 1.6 % (ref 0.0–2.0)
Basophils Absolute: 0 10*3/uL (ref 0.0–0.1)
EOS%: 3.6 % (ref 0.0–7.0)
Eosinophils Absolute: 0.1 10*3/uL (ref 0.0–0.5)
HCT: 33.4 % — ABNORMAL LOW (ref 34.8–46.6)
HGB: 12 g/dL (ref 11.6–15.9)
LYMPH%: 21.8 % (ref 14.0–48.0)
MCH: 32.5 pg (ref 26.0–34.0)
MCHC: 35.9 g/dL (ref 32.0–36.0)
MCV: 90.6 fL (ref 81.0–101.0)
MONO#: 0.2 10*3/uL (ref 0.1–0.9)
MONO%: 8.7 % (ref 0.0–13.0)
NEUT#: 1.2 10*3/uL — ABNORMAL LOW (ref 1.5–6.5)
NEUT%: 64.3 % (ref 39.6–76.8)
Platelets: 136 10*3/uL — ABNORMAL LOW (ref 145–400)
RBC: 3.69 10*6/uL — ABNORMAL LOW (ref 3.70–5.32)
RDW: 12.4 % (ref 11.3–14.5)
WBC: 1.9 10*3/uL — ABNORMAL LOW (ref 3.9–10.0)
lymph#: 0.4 10*3/uL — ABNORMAL LOW (ref 0.9–3.3)

## 2006-08-27 LAB — CBC WITH DIFFERENTIAL/PLATELET
BASO%: 1.1 % (ref 0.0–2.0)
Basophils Absolute: 0 10*3/uL (ref 0.0–0.1)
EOS%: 3.4 % (ref 0.0–7.0)
Eosinophils Absolute: 0.1 10*3/uL (ref 0.0–0.5)
HCT: 33 % — ABNORMAL LOW (ref 34.8–46.6)
HGB: 12 g/dL (ref 11.6–15.9)
LYMPH%: 18.1 % (ref 14.0–48.0)
MCH: 32.9 pg (ref 26.0–34.0)
MCHC: 36.3 g/dL — ABNORMAL HIGH (ref 32.0–36.0)
MCV: 90.6 fL (ref 81.0–101.0)
MONO#: 0.2 10*3/uL (ref 0.1–0.9)
MONO%: 8.2 % (ref 0.0–13.0)
NEUT#: 1.4 10*3/uL — ABNORMAL LOW (ref 1.5–6.5)
NEUT%: 69.2 % (ref 39.6–76.8)
Platelets: 156 10*3/uL (ref 145–400)
RBC: 3.64 10*6/uL — ABNORMAL LOW (ref 3.70–5.32)
RDW: 12.6 % (ref 11.3–14.5)
WBC: 2 10*3/uL — ABNORMAL LOW (ref 3.9–10.0)
lymph#: 0.4 10*3/uL — ABNORMAL LOW (ref 0.9–3.3)

## 2006-09-07 ENCOUNTER — Ambulatory Visit: Admission: RE | Admit: 2006-09-07 | Discharge: 2006-12-06 | Payer: Self-pay | Admitting: Radiation Oncology

## 2006-09-17 ENCOUNTER — Encounter: Admission: RE | Admit: 2006-09-17 | Discharge: 2006-09-17 | Payer: Self-pay | Admitting: Radiation Oncology

## 2006-09-24 ENCOUNTER — Ambulatory Visit (HOSPITAL_BASED_OUTPATIENT_CLINIC_OR_DEPARTMENT_OTHER): Admission: RE | Admit: 2006-09-24 | Discharge: 2006-09-24 | Payer: Self-pay | Admitting: General Surgery

## 2006-09-24 ENCOUNTER — Encounter (INDEPENDENT_AMBULATORY_CARE_PROVIDER_SITE_OTHER): Payer: Self-pay | Admitting: Specialist

## 2006-10-22 ENCOUNTER — Ambulatory Visit: Payer: Self-pay | Admitting: Oncology

## 2006-10-22 LAB — COMPREHENSIVE METABOLIC PANEL
ALT: 23 U/L (ref 0–35)
AST: 20 U/L (ref 0–37)
Albumin: 4.6 g/dL (ref 3.5–5.2)
Alkaline Phosphatase: 70 U/L (ref 39–117)
BUN: 12 mg/dL (ref 6–23)
CO2: 25 mEq/L (ref 19–32)
Calcium: 9.6 mg/dL (ref 8.4–10.5)
Chloride: 104 mEq/L (ref 96–112)
Creatinine, Ser: 0.64 mg/dL (ref 0.40–1.20)
Glucose, Bld: 78 mg/dL (ref 70–99)
Potassium: 4.1 mEq/L (ref 3.5–5.3)
Sodium: 142 mEq/L (ref 135–145)
Total Bilirubin: 1.2 mg/dL (ref 0.3–1.2)
Total Protein: 7.1 g/dL (ref 6.0–8.3)

## 2006-10-22 LAB — CBC WITH DIFFERENTIAL/PLATELET
BASO%: 0.3 % (ref 0.0–2.0)
Basophils Absolute: 0 10*3/uL (ref 0.0–0.1)
EOS%: 4.6 % (ref 0.0–7.0)
Eosinophils Absolute: 0.2 10*3/uL (ref 0.0–0.5)
HCT: 33.9 % — ABNORMAL LOW (ref 34.8–46.6)
HGB: 12.1 g/dL (ref 11.6–15.9)
LYMPH%: 21 % (ref 14.0–48.0)
MCH: 33.6 pg (ref 26.0–34.0)
MCHC: 35.7 g/dL (ref 32.0–36.0)
MCV: 94.1 fL (ref 81.0–101.0)
MONO#: 0.2 10*3/uL (ref 0.1–0.9)
MONO%: 7.2 % (ref 0.0–13.0)
NEUT#: 2.2 10*3/uL (ref 1.5–6.5)
NEUT%: 66.9 % (ref 39.6–76.8)
Platelets: 172 10*3/uL (ref 145–400)
RBC: 3.6 10*6/uL — ABNORMAL LOW (ref 3.70–5.32)
RDW: 13.6 % (ref 11.3–14.5)
WBC: 3.3 10*3/uL — ABNORMAL LOW (ref 3.9–10.0)
lymph#: 0.7 10*3/uL — ABNORMAL LOW (ref 0.9–3.3)

## 2006-10-22 LAB — CANCER ANTIGEN 27.29: CA 27.29: 17 U/mL (ref 0–39)

## 2006-12-07 ENCOUNTER — Ambulatory Visit: Payer: Self-pay | Admitting: Oncology

## 2006-12-07 ENCOUNTER — Ambulatory Visit: Admission: RE | Admit: 2006-12-07 | Discharge: 2007-02-21 | Payer: Self-pay | Admitting: Radiation Oncology

## 2006-12-11 LAB — CBC WITH DIFFERENTIAL/PLATELET
BASO%: 0.3 % (ref 0.0–2.0)
Basophils Absolute: 0 10*3/uL (ref 0.0–0.1)
EOS%: 2.5 % (ref 0.0–7.0)
Eosinophils Absolute: 0.1 10*3/uL (ref 0.0–0.5)
HCT: 36.7 % (ref 34.8–46.6)
HGB: 13.2 g/dL (ref 11.6–15.9)
LYMPH%: 16.9 % (ref 14.0–48.0)
MCH: 33.9 pg (ref 26.0–34.0)
MCHC: 35.9 g/dL (ref 32.0–36.0)
MCV: 94.6 fL (ref 81.0–101.0)
MONO#: 0.2 10*3/uL (ref 0.1–0.9)
MONO%: 7.7 % (ref 0.0–13.0)
NEUT#: 2.1 10*3/uL (ref 1.5–6.5)
NEUT%: 72.6 % (ref 39.6–76.8)
Platelets: 153 10*3/uL (ref 145–400)
RBC: 3.88 10*6/uL (ref 3.70–5.32)
RDW: 12.4 % (ref 11.3–14.5)
WBC: 2.9 10*3/uL — ABNORMAL LOW (ref 3.9–10.0)
lymph#: 0.5 10*3/uL — ABNORMAL LOW (ref 0.9–3.3)

## 2006-12-11 LAB — FOLLICLE STIMULATING HORMONE: FSH: 125.2 m[IU]/mL

## 2006-12-11 LAB — VITAMIN B12: Vitamin B-12: 345 pg/mL (ref 211–911)

## 2006-12-31 LAB — ESTRADIOL, ULTRA SENS: Estradiol, Ultra Sensitive: 19 pg/mL

## 2007-01-03 ENCOUNTER — Ambulatory Visit (HOSPITAL_BASED_OUTPATIENT_CLINIC_OR_DEPARTMENT_OTHER): Admission: RE | Admit: 2007-01-03 | Discharge: 2007-01-03 | Payer: Self-pay | Admitting: General Surgery

## 2007-01-03 HISTORY — PX: PORT-A-CATH REMOVAL: SHX5289

## 2007-03-06 ENCOUNTER — Ambulatory Visit: Payer: Self-pay | Admitting: Oncology

## 2007-03-11 LAB — COMPREHENSIVE METABOLIC PANEL
ALT: 12 U/L (ref 0–35)
AST: 17 U/L (ref 0–37)
Albumin: 4.6 g/dL (ref 3.5–5.2)
Alkaline Phosphatase: 73 U/L (ref 39–117)
BUN: 16 mg/dL (ref 6–23)
CO2: 27 mEq/L (ref 19–32)
Calcium: 9.8 mg/dL (ref 8.4–10.5)
Chloride: 106 mEq/L (ref 96–112)
Creatinine, Ser: 0.76 mg/dL (ref 0.40–1.20)
Glucose, Bld: 88 mg/dL (ref 70–99)
Potassium: 4.1 mEq/L (ref 3.5–5.3)
Sodium: 142 mEq/L (ref 135–145)
Total Bilirubin: 0.7 mg/dL (ref 0.3–1.2)
Total Protein: 7.5 g/dL (ref 6.0–8.3)

## 2007-03-11 LAB — CBC WITH DIFFERENTIAL/PLATELET
BASO%: 0 % (ref 0.0–2.0)
Basophils Absolute: 0 10*3/uL (ref 0.0–0.1)
EOS%: 2.7 % (ref 0.0–7.0)
Eosinophils Absolute: 0.1 10*3/uL (ref 0.0–0.5)
HCT: 36.9 % (ref 34.8–46.6)
HGB: 13.3 g/dL (ref 11.6–15.9)
LYMPH%: 25.9 % (ref 14.0–48.0)
MCH: 34 pg (ref 26.0–34.0)
MCHC: 36 g/dL (ref 32.0–36.0)
MCV: 94.7 fL (ref 81.0–101.0)
MONO#: 0.2 10*3/uL (ref 0.1–0.9)
MONO%: 5.1 % (ref 0.0–13.0)
NEUT#: 2.4 10*3/uL (ref 1.5–6.5)
NEUT%: 66.3 % (ref 39.6–76.8)
Platelets: 155 10*3/uL (ref 145–400)
RBC: 3.9 10*6/uL (ref 3.70–5.32)
RDW: 12.4 % (ref 11.3–14.5)
WBC: 3.6 10*3/uL — ABNORMAL LOW (ref 3.9–10.0)
lymph#: 0.9 10*3/uL (ref 0.9–3.3)

## 2007-03-11 LAB — FOLLICLE STIMULATING HORMONE: FSH: 95.1 m[IU]/mL

## 2007-03-11 LAB — CANCER ANTIGEN 27.29: CA 27.29: 21 U/mL (ref 0–39)

## 2007-03-22 LAB — ESTRADIOL, ULTRA SENS: Estradiol, Ultra Sensitive: 29 pg/mL

## 2007-03-25 ENCOUNTER — Ambulatory Visit (HOSPITAL_COMMUNITY): Admission: RE | Admit: 2007-03-25 | Discharge: 2007-03-25 | Payer: Self-pay | Admitting: Oncology

## 2007-04-03 ENCOUNTER — Other Ambulatory Visit: Admission: RE | Admit: 2007-04-03 | Discharge: 2007-04-03 | Payer: Self-pay | Admitting: Obstetrics and Gynecology

## 2007-05-28 ENCOUNTER — Encounter: Admission: RE | Admit: 2007-05-28 | Discharge: 2007-05-28 | Payer: Self-pay | Admitting: Oncology

## 2007-06-03 ENCOUNTER — Ambulatory Visit: Payer: Self-pay | Admitting: Oncology

## 2007-06-05 LAB — CBC WITH DIFFERENTIAL/PLATELET
BASO%: 0.9 % (ref 0.0–2.0)
Basophils Absolute: 0 10*3/uL (ref 0.0–0.1)
EOS%: 3.8 % (ref 0.0–7.0)
Eosinophils Absolute: 0.1 10*3/uL (ref 0.0–0.5)
HCT: UNDETERMINED % (ref 34.8–46.6)
HGB: 12.3 g/dL (ref 11.6–15.9)
LYMPH%: 20.9 % (ref 14.0–48.0)
MCH: UNDETERMINED pg (ref 26.0–34.0)
MCHC: UNDETERMINED g/dL (ref 32.0–36.0)
MCV: UNDETERMINED fL (ref 81.0–101.0)
MONO#: 0.2 10*3/uL (ref 0.1–0.9)
MONO%: 6.6 % (ref 0.0–13.0)
NEUT#: 2.5 10*3/uL (ref 1.5–6.5)
NEUT%: 67.8 % (ref 39.6–76.8)
Platelets: 139 10*3/uL — ABNORMAL LOW (ref 145–400)
RBC: UNDETERMINED 10*6/uL (ref 3.70–5.32)
RDW: UNDETERMINED % (ref 11.3–14.5)
WBC: 3.6 10*3/uL — ABNORMAL LOW (ref 3.9–10.0)
lymph#: 0.8 10*3/uL — ABNORMAL LOW (ref 0.9–3.3)

## 2007-06-05 LAB — COMPREHENSIVE METABOLIC PANEL
ALT: 10 U/L (ref 0–35)
AST: 16 U/L (ref 0–37)
Albumin: 4.3 g/dL (ref 3.5–5.2)
Alkaline Phosphatase: 65 U/L (ref 39–117)
BUN: 14 mg/dL (ref 6–23)
CO2: 27 mEq/L (ref 19–32)
Calcium: 9.2 mg/dL (ref 8.4–10.5)
Chloride: 108 mEq/L (ref 96–112)
Creatinine, Ser: 0.77 mg/dL (ref 0.40–1.20)
Glucose, Bld: 90 mg/dL (ref 70–99)
Potassium: 3.9 mEq/L (ref 3.5–5.3)
Sodium: 142 mEq/L (ref 135–145)
Total Bilirubin: 0.8 mg/dL (ref 0.3–1.2)
Total Protein: 7 g/dL (ref 6.0–8.3)

## 2007-06-05 LAB — CANCER ANTIGEN 27.29: CA 27.29: 19 U/mL (ref 0–39)

## 2007-07-10 ENCOUNTER — Encounter: Admission: RE | Admit: 2007-07-10 | Discharge: 2007-07-10 | Payer: Self-pay | Admitting: Family Medicine

## 2007-07-16 ENCOUNTER — Encounter: Admission: RE | Admit: 2007-07-16 | Discharge: 2007-07-16 | Payer: Self-pay | Admitting: Family Medicine

## 2007-08-29 ENCOUNTER — Encounter: Admission: RE | Admit: 2007-08-29 | Discharge: 2007-08-29 | Payer: Self-pay | Admitting: Oncology

## 2007-12-09 ENCOUNTER — Ambulatory Visit: Payer: Self-pay | Admitting: Oncology

## 2007-12-11 LAB — CBC WITH DIFFERENTIAL/PLATELET
BASO%: 0.4 % (ref 0.0–2.0)
Basophils Absolute: 0 10*3/uL (ref 0.0–0.1)
EOS%: 2 % (ref 0.0–7.0)
Eosinophils Absolute: 0.1 10*3/uL (ref 0.0–0.5)
HCT: 35.1 % (ref 34.8–46.6)
HGB: 12.8 g/dL (ref 11.6–15.9)
LYMPH%: 26.1 % (ref 14.0–48.0)
MCH: 34.3 pg — ABNORMAL HIGH (ref 26.0–34.0)
MCHC: 36.4 g/dL — ABNORMAL HIGH (ref 32.0–36.0)
MCV: 94.2 fL (ref 81.0–101.0)
MONO#: 0.2 10*3/uL (ref 0.1–0.9)
MONO%: 5.3 % (ref 0.0–13.0)
NEUT#: 2.9 10*3/uL (ref 1.5–6.5)
NEUT%: 66.2 % (ref 39.6–76.8)
Platelets: 163 10*3/uL (ref 145–400)
RBC: 3.72 10*6/uL (ref 3.70–5.32)
RDW: 12.2 % (ref 11.3–14.5)
WBC: 4.4 10*3/uL (ref 3.9–10.0)
lymph#: 1.2 10*3/uL (ref 0.9–3.3)

## 2007-12-11 LAB — COMPREHENSIVE METABOLIC PANEL
ALT: 13 U/L (ref 0–35)
AST: 15 U/L (ref 0–37)
Albumin: 4.3 g/dL (ref 3.5–5.2)
Alkaline Phosphatase: 53 U/L (ref 39–117)
BUN: 16 mg/dL (ref 6–23)
CO2: 27 mEq/L (ref 19–32)
Calcium: 9.1 mg/dL (ref 8.4–10.5)
Chloride: 104 mEq/L (ref 96–112)
Creatinine, Ser: 0.77 mg/dL (ref 0.40–1.20)
Glucose, Bld: 85 mg/dL (ref 70–99)
Potassium: 3.9 mEq/L (ref 3.5–5.3)
Sodium: 143 mEq/L (ref 135–145)
Total Bilirubin: 0.6 mg/dL (ref 0.3–1.2)
Total Protein: 7 g/dL (ref 6.0–8.3)

## 2007-12-11 LAB — CANCER ANTIGEN 27.29: CA 27.29: 21 U/mL (ref 0–39)

## 2007-12-24 ENCOUNTER — Ambulatory Visit (HOSPITAL_COMMUNITY): Admission: RE | Admit: 2007-12-24 | Discharge: 2007-12-24 | Payer: Self-pay | Admitting: Oncology

## 2008-03-17 IMAGING — MG MM MAMMO DIAG UNILATERAL*R*
4 series · 4 of 4 positions shown · non-contrast
Comparison: none

DIGITAL UNILATERAL RIGHT DIAGNOSTIC MAMMOGRAM:
CLINICAL DATA: Six month follow-up.

[R CC (1 of 2)]
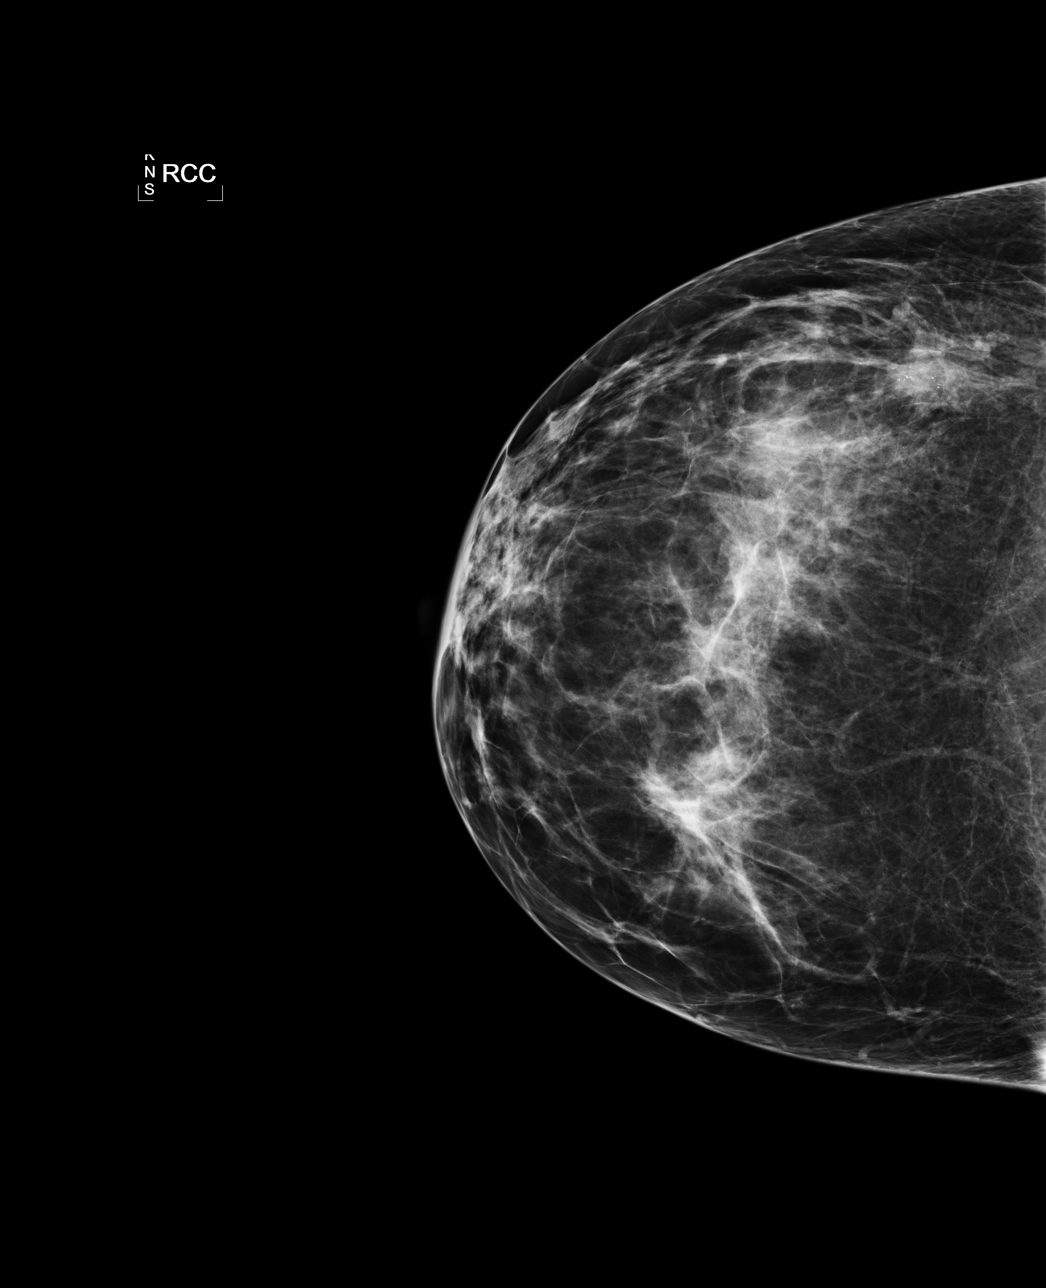

[R MLO]
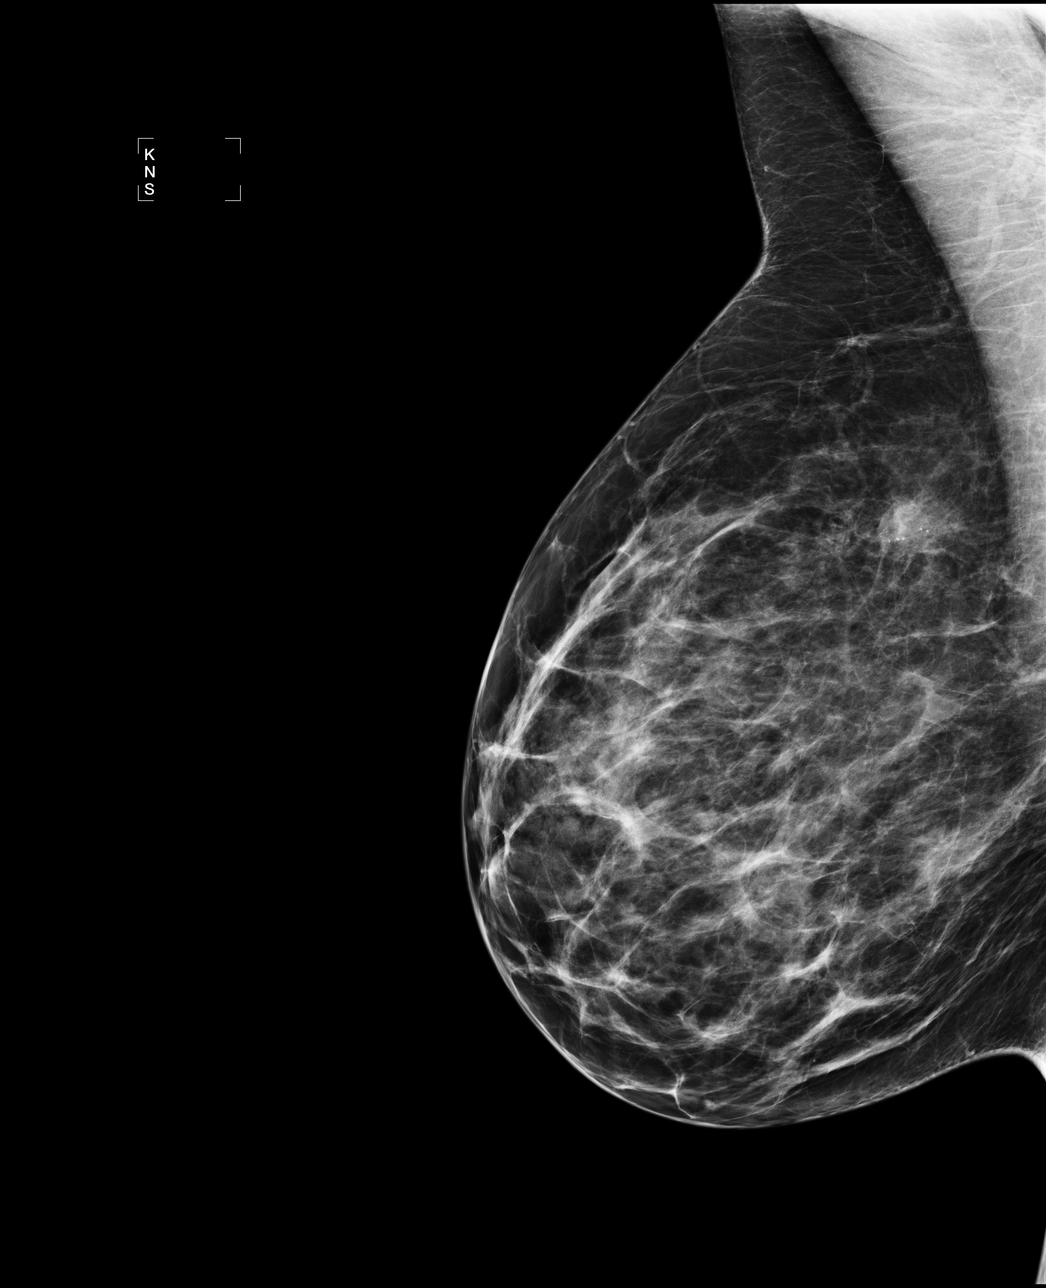

[R CC (2 of 2)]
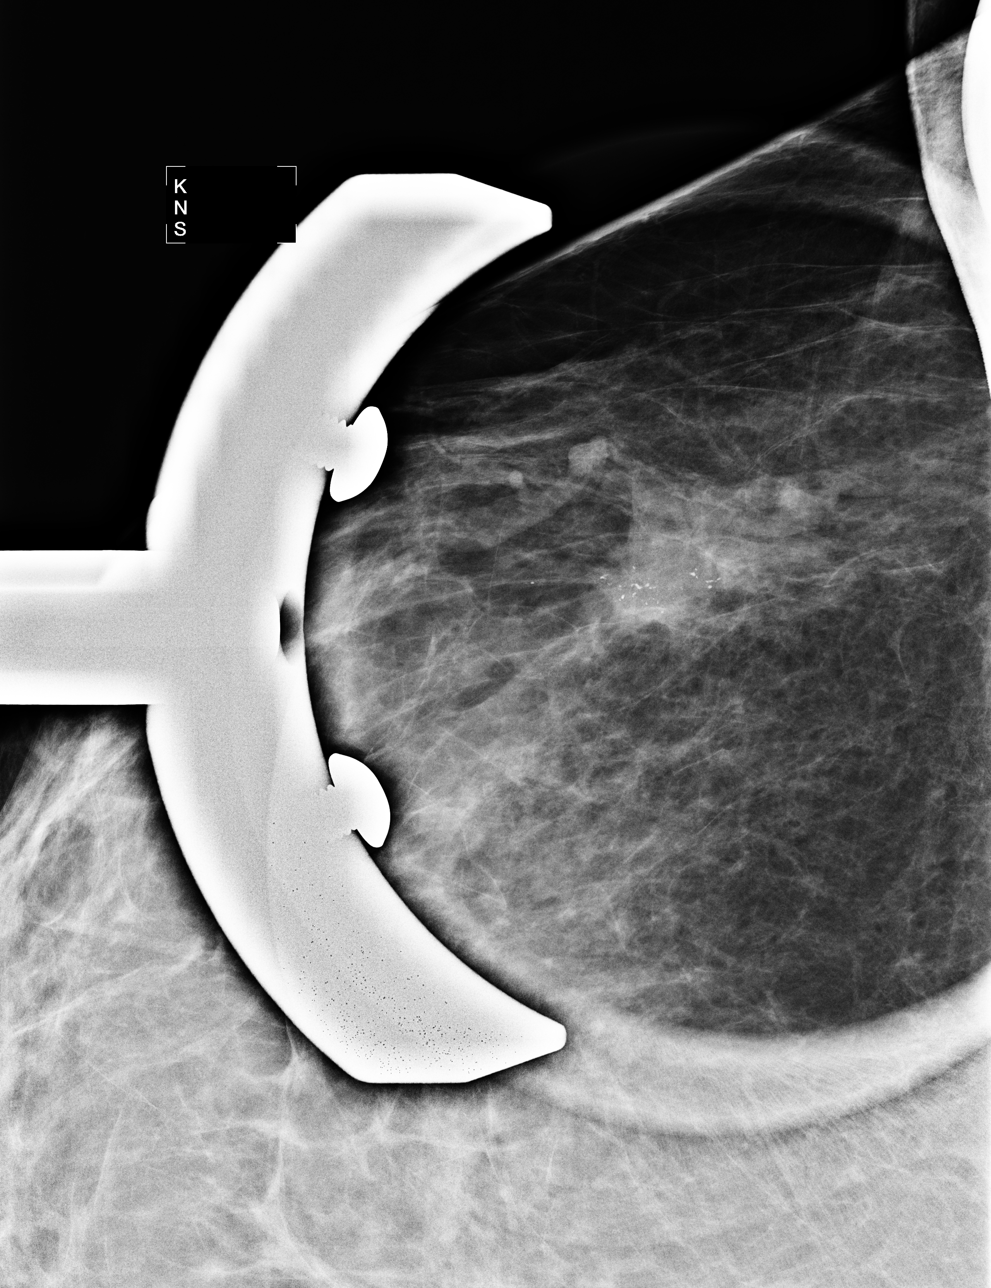

[R ML]
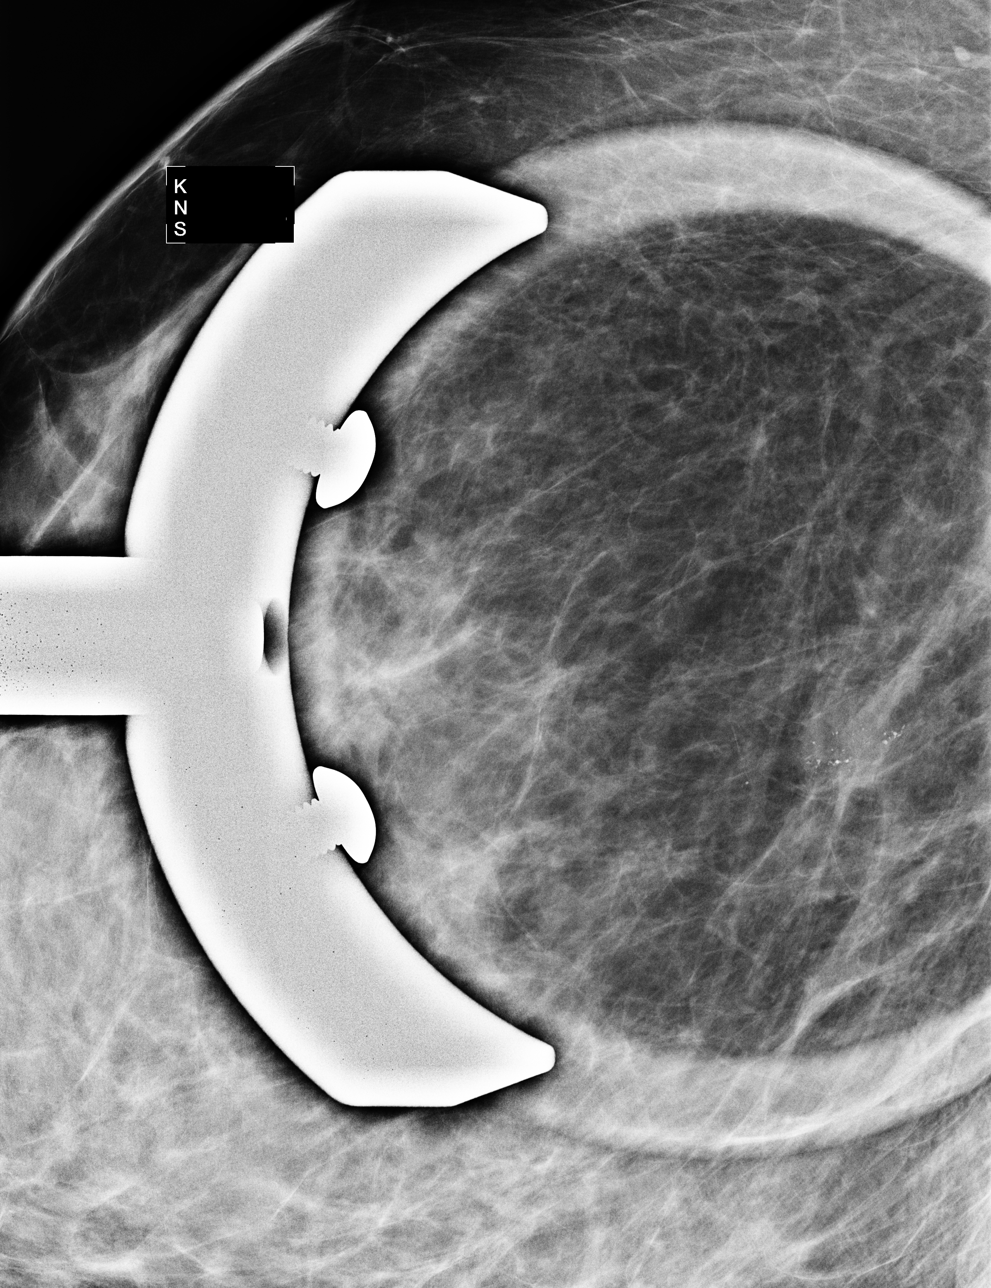

[4 of 4 positions shown; findings below may reference images not displayed]

Comparison studies are dated 08-24-05 and 09-05-05.  The calcifications in the right upper outer 
quadrant have increased in number since the prior study; therefore, stereotactic core biopsy is 
recommended.  No additional suspicious calcifications, masses or distortion are seen within the 
right breast.
IMPRESSION: Interval increase in the number of microcalcifications in the right upper outer breast since the 
prior study.  Stereotactic-guided biopsy is recommended and will be performed today.  Findings and 
recommendations were called to Dr. [REDACTED] at the time of the exam.

ASSESSMENT: Suspicious - BI-RADS 4

Needle biopsy of the right breast.

## 2008-04-06 ENCOUNTER — Other Ambulatory Visit: Admission: RE | Admit: 2008-04-06 | Discharge: 2008-04-06 | Payer: Self-pay | Admitting: Obstetrics and Gynecology

## 2008-05-14 ENCOUNTER — Ambulatory Visit (HOSPITAL_COMMUNITY): Admission: RE | Admit: 2008-05-14 | Discharge: 2008-05-14 | Payer: Self-pay | Admitting: Hematology and Oncology

## 2008-08-21 ENCOUNTER — Ambulatory Visit: Payer: Self-pay | Admitting: Oncology

## 2008-08-25 LAB — CBC WITH DIFFERENTIAL/PLATELET
BASO%: 0.3 % (ref 0.0–2.0)
Basophils Absolute: 0 10*3/uL (ref 0.0–0.1)
EOS%: 2.5 % (ref 0.0–7.0)
Eosinophils Absolute: 0.1 10*3/uL (ref 0.0–0.5)
HCT: 35.7 % (ref 34.8–46.6)
HGB: 12.6 g/dL (ref 11.6–15.9)
LYMPH%: 22.4 % (ref 14.0–48.0)
MCH: 33.9 pg (ref 26.0–34.0)
MCHC: 35.4 g/dL (ref 32.0–36.0)
MCV: 95.6 fL (ref 81.0–101.0)
MONO#: 0.2 10*3/uL (ref 0.1–0.9)
MONO%: 5.8 % (ref 0.0–13.0)
NEUT#: 2.9 10*3/uL (ref 1.5–6.5)
NEUT%: 69 % (ref 39.6–76.8)
Platelets: 156 10*3/uL (ref 145–400)
RBC: 3.73 10*6/uL (ref 3.70–5.32)
RDW: 12.7 % (ref 11.3–14.5)
WBC: 4.2 10*3/uL (ref 3.9–10.0)
lymph#: 0.9 10*3/uL (ref 0.9–3.3)

## 2008-08-26 LAB — COMPREHENSIVE METABOLIC PANEL
ALT: 13 U/L (ref 0–35)
AST: 15 U/L (ref 0–37)
Albumin: 4.4 g/dL (ref 3.5–5.2)
Alkaline Phosphatase: 45 U/L (ref 39–117)
BUN: 13 mg/dL (ref 6–23)
CO2: 27 mEq/L (ref 19–32)
Calcium: 9.2 mg/dL (ref 8.4–10.5)
Chloride: 107 mEq/L (ref 96–112)
Creatinine, Ser: 0.77 mg/dL (ref 0.40–1.20)
Glucose, Bld: 94 mg/dL (ref 70–99)
Potassium: 4 mEq/L (ref 3.5–5.3)
Sodium: 142 mEq/L (ref 135–145)
Total Bilirubin: 0.6 mg/dL (ref 0.3–1.2)
Total Protein: 6.8 g/dL (ref 6.0–8.3)

## 2008-08-26 LAB — CANCER ANTIGEN 27.29: CA 27.29: 27 U/mL (ref 0–39)

## 2008-08-26 LAB — VITAMIN D 25 HYDROXY (VIT D DEFICIENCY, FRACTURES): Vit D, 25-Hydroxy: 45 ng/mL (ref 30–89)

## 2008-08-31 ENCOUNTER — Encounter: Admission: RE | Admit: 2008-08-31 | Discharge: 2008-08-31 | Payer: Self-pay | Admitting: Oncology

## 2008-09-09 ENCOUNTER — Ambulatory Visit (HOSPITAL_COMMUNITY): Admission: RE | Admit: 2008-09-09 | Discharge: 2008-09-09 | Payer: Self-pay | Admitting: Family Medicine

## 2008-09-16 LAB — HCG, SERUM, QUALITATIVE: Preg, Serum: NEGATIVE

## 2008-09-16 LAB — RESEARCH LABS

## 2009-02-22 ENCOUNTER — Ambulatory Visit: Payer: Self-pay | Admitting: Oncology

## 2009-02-24 LAB — CBC WITH DIFFERENTIAL/PLATELET
BASO%: 0.3 % (ref 0.0–2.0)
Basophils Absolute: 0 10*3/uL (ref 0.0–0.1)
EOS%: 2.4 % (ref 0.0–7.0)
Eosinophils Absolute: 0.1 10*3/uL (ref 0.0–0.5)
HCT: 37 % (ref 34.8–46.6)
HGB: 13.2 g/dL (ref 11.6–15.9)
LYMPH%: 20 % (ref 14.0–49.7)
MCH: 33.3 pg (ref 25.1–34.0)
MCHC: 35.6 g/dL (ref 31.5–36.0)
MCV: 93.6 fL (ref 79.5–101.0)
MONO#: 0.3 10*3/uL (ref 0.1–0.9)
MONO%: 6.1 % (ref 0.0–14.0)
NEUT#: 2.9 10*3/uL (ref 1.5–6.5)
NEUT%: 71.2 % (ref 38.4–76.8)
Platelets: 169 10*3/uL (ref 145–400)
RBC: 3.96 10*6/uL (ref 3.70–5.45)
RDW: 12.1 % (ref 11.2–14.5)
WBC: 4.1 10*3/uL (ref 3.9–10.3)
lymph#: 0.8 10*3/uL — ABNORMAL LOW (ref 0.9–3.3)

## 2009-02-24 LAB — RESEARCH LABS

## 2009-02-25 LAB — COMPREHENSIVE METABOLIC PANEL
ALT: 12 U/L (ref 0–35)
AST: 16 U/L (ref 0–37)
Albumin: 4.3 g/dL (ref 3.5–5.2)
Alkaline Phosphatase: 52 U/L (ref 39–117)
BUN: 12 mg/dL (ref 6–23)
CO2: 25 mEq/L (ref 19–32)
Calcium: 9.3 mg/dL (ref 8.4–10.5)
Chloride: 104 mEq/L (ref 96–112)
Creatinine, Ser: 0.66 mg/dL (ref 0.40–1.20)
Glucose, Bld: 117 mg/dL — ABNORMAL HIGH (ref 70–99)
Potassium: 3.9 mEq/L (ref 3.5–5.3)
Sodium: 139 mEq/L (ref 135–145)
Total Bilirubin: 0.6 mg/dL (ref 0.3–1.2)
Total Protein: 7 g/dL (ref 6.0–8.3)

## 2009-02-25 LAB — CANCER ANTIGEN 27.29: CA 27.29: 21 U/mL (ref 0–39)

## 2009-02-25 LAB — VITAMIN D 25 HYDROXY (VIT D DEFICIENCY, FRACTURES): Vit D, 25-Hydroxy: 44 ng/mL (ref 30–89)

## 2009-07-07 ENCOUNTER — Other Ambulatory Visit: Admission: RE | Admit: 2009-07-07 | Discharge: 2009-07-07 | Payer: Self-pay | Admitting: Obstetrics and Gynecology

## 2009-07-12 ENCOUNTER — Ambulatory Visit: Payer: Self-pay | Admitting: Oncology

## 2009-07-14 ENCOUNTER — Encounter: Admission: RE | Admit: 2009-07-14 | Discharge: 2009-07-14 | Payer: Self-pay | Admitting: Obstetrics and Gynecology

## 2009-09-07 ENCOUNTER — Ambulatory Visit: Payer: Self-pay | Admitting: Oncology

## 2009-09-09 LAB — CBC WITH DIFFERENTIAL/PLATELET
BASO%: 0.3 % (ref 0.0–2.0)
Basophils Absolute: 0 10*3/uL (ref 0.0–0.1)
EOS%: 2.8 % (ref 0.0–7.0)
Eosinophils Absolute: 0.1 10*3/uL (ref 0.0–0.5)
HCT: 35 % (ref 34.8–46.6)
HGB: 12.4 g/dL (ref 11.6–15.9)
LYMPH%: 28.4 % (ref 14.0–49.7)
MCH: 32.9 pg (ref 25.1–34.0)
MCHC: 35.4 g/dL (ref 31.5–36.0)
MCV: 92.8 fL (ref 79.5–101.0)
MONO#: 0.2 10*3/uL (ref 0.1–0.9)
MONO%: 6.1 % (ref 0.0–14.0)
NEUT#: 2.5 10*3/uL (ref 1.5–6.5)
NEUT%: 62.4 % (ref 38.4–76.8)
Platelets: 155 10*3/uL (ref 145–400)
RBC: 3.77 10*6/uL (ref 3.70–5.45)
RDW: 12.4 % (ref 11.2–14.5)
WBC: 4 10*3/uL (ref 3.9–10.3)
lymph#: 1.1 10*3/uL (ref 0.9–3.3)

## 2009-09-09 LAB — COMPREHENSIVE METABOLIC PANEL
ALT: 17 U/L (ref 0–35)
AST: 19 U/L (ref 0–37)
Albumin: 3.9 g/dL (ref 3.5–5.2)
Alkaline Phosphatase: 46 U/L (ref 39–117)
BUN: 9 mg/dL (ref 6–23)
CO2: 30 mEq/L (ref 19–32)
Calcium: 9.3 mg/dL (ref 8.4–10.5)
Chloride: 107 mEq/L (ref 96–112)
Creatinine, Ser: 0.61 mg/dL (ref 0.40–1.20)
Glucose, Bld: 90 mg/dL (ref 70–99)
Potassium: 3.8 mEq/L (ref 3.5–5.3)
Sodium: 141 mEq/L (ref 135–145)
Total Bilirubin: 0.7 mg/dL (ref 0.3–1.2)
Total Protein: 6.7 g/dL (ref 6.0–8.3)

## 2009-09-10 LAB — CANCER ANTIGEN 27.29: CA 27.29: 27 U/mL (ref 0–39)

## 2009-09-10 LAB — VITAMIN D 25 HYDROXY (VIT D DEFICIENCY, FRACTURES): Vit D, 25-Hydroxy: 37 ng/mL (ref 30–89)

## 2009-11-02 ENCOUNTER — Encounter: Admission: RE | Admit: 2009-11-02 | Discharge: 2009-11-02 | Payer: Self-pay | Admitting: Oncology

## 2009-11-29 ENCOUNTER — Ambulatory Visit (HOSPITAL_COMMUNITY): Admission: RE | Admit: 2009-11-29 | Discharge: 2009-11-29 | Payer: Self-pay | Admitting: Obstetrics and Gynecology

## 2010-01-14 LAB — HM COLONOSCOPY

## 2010-03-03 ENCOUNTER — Ambulatory Visit: Payer: Self-pay | Admitting: Oncology

## 2010-03-03 LAB — CBC WITH DIFFERENTIAL/PLATELET
BASO%: 0.4 % (ref 0.0–2.0)
Basophils Absolute: 0 10*3/uL (ref 0.0–0.1)
EOS%: 2.7 % (ref 0.0–7.0)
Eosinophils Absolute: 0.1 10*3/uL (ref 0.0–0.5)
HCT: 37.4 % (ref 34.8–46.6)
HGB: 13 g/dL (ref 11.6–15.9)
LYMPH%: 25.6 % (ref 14.0–49.7)
MCH: 32 pg (ref 25.1–34.0)
MCHC: 34.8 g/dL (ref 31.5–36.0)
MCV: 92.1 fL (ref 79.5–101.0)
MONO#: 0.3 10*3/uL (ref 0.1–0.9)
MONO%: 5.9 % (ref 0.0–14.0)
NEUT#: 3.1 10*3/uL (ref 1.5–6.5)
NEUT%: 65.4 % (ref 38.4–76.8)
Platelets: 155 10*3/uL (ref 145–400)
RBC: 4.06 10*6/uL (ref 3.70–5.45)
RDW: 12.3 % (ref 11.2–14.5)
WBC: 4.8 10*3/uL (ref 3.9–10.3)
lymph#: 1.2 10*3/uL (ref 0.9–3.3)
nRBC: 0 % (ref 0–0)

## 2010-03-03 LAB — COMPREHENSIVE METABOLIC PANEL
ALT: 16 U/L (ref 0–35)
AST: 19 U/L (ref 0–37)
Albumin: 4 g/dL (ref 3.5–5.2)
Alkaline Phosphatase: 42 U/L (ref 39–117)
BUN: 11 mg/dL (ref 6–23)
CO2: 27 mEq/L (ref 19–32)
Calcium: 9 mg/dL (ref 8.4–10.5)
Chloride: 106 mEq/L (ref 96–112)
Creatinine, Ser: 0.62 mg/dL (ref 0.40–1.20)
Glucose, Bld: 105 mg/dL — ABNORMAL HIGH (ref 70–99)
Potassium: 3.8 mEq/L (ref 3.5–5.3)
Sodium: 139 mEq/L (ref 135–145)
Total Bilirubin: 0.7 mg/dL (ref 0.3–1.2)
Total Protein: 6.9 g/dL (ref 6.0–8.3)

## 2010-07-08 ENCOUNTER — Other Ambulatory Visit: Admission: RE | Admit: 2010-07-08 | Discharge: 2010-07-08 | Payer: Self-pay | Admitting: Obstetrics and Gynecology

## 2010-07-15 ENCOUNTER — Encounter: Admission: RE | Admit: 2010-07-15 | Discharge: 2010-07-15 | Payer: Self-pay | Admitting: Oncology

## 2010-09-05 ENCOUNTER — Ambulatory Visit: Payer: Self-pay | Admitting: Oncology

## 2010-09-07 LAB — COMPREHENSIVE METABOLIC PANEL
ALT: 14 U/L (ref 0–35)
AST: 19 U/L (ref 0–37)
Albumin: 3.9 g/dL (ref 3.5–5.2)
Alkaline Phosphatase: 49 U/L (ref 39–117)
BUN: 10 mg/dL (ref 6–23)
CO2: 29 mEq/L (ref 19–32)
Calcium: 9.2 mg/dL (ref 8.4–10.5)
Chloride: 103 mEq/L (ref 96–112)
Creatinine, Ser: 0.8 mg/dL (ref 0.40–1.20)
Glucose, Bld: 91 mg/dL (ref 70–99)
Potassium: 3.6 mEq/L (ref 3.5–5.3)
Sodium: 139 mEq/L (ref 135–145)
Total Bilirubin: 0.5 mg/dL (ref 0.3–1.2)
Total Protein: 7 g/dL (ref 6.0–8.3)

## 2010-09-07 LAB — CBC WITH DIFFERENTIAL/PLATELET
BASO%: 0.4 % (ref 0.0–2.0)
Basophils Absolute: 0 10*3/uL (ref 0.0–0.1)
EOS%: 1.9 % (ref 0.0–7.0)
Eosinophils Absolute: 0.1 10*3/uL (ref 0.0–0.5)
HCT: 37.4 % (ref 34.8–46.6)
HGB: 13.1 g/dL (ref 11.6–15.9)
LYMPH%: 26 % (ref 14.0–49.7)
MCH: 32.2 pg (ref 25.1–34.0)
MCHC: 35 g/dL (ref 31.5–36.0)
MCV: 91.9 fL (ref 79.5–101.0)
MONO#: 0.2 10*3/uL (ref 0.1–0.9)
MONO%: 4.9 % (ref 0.0–14.0)
NEUT#: 3.1 10*3/uL (ref 1.5–6.5)
NEUT%: 66.8 % (ref 38.4–76.8)
Platelets: 159 10*3/uL (ref 145–400)
RBC: 4.07 10*6/uL (ref 3.70–5.45)
RDW: 12.8 % (ref 11.2–14.5)
WBC: 4.7 10*3/uL (ref 3.9–10.3)
lymph#: 1.2 10*3/uL (ref 0.9–3.3)
nRBC: 0 % (ref 0–0)

## 2010-09-08 LAB — CANCER ANTIGEN 27.29: CA 27.29: 26 U/mL (ref 0–39)

## 2010-09-08 LAB — VITAMIN D 25 HYDROXY (VIT D DEFICIENCY, FRACTURES): Vit D, 25-Hydroxy: 57 ng/mL (ref 30–89)

## 2010-12-10 ENCOUNTER — Encounter: Payer: Self-pay | Admitting: Obstetrics and Gynecology

## 2010-12-10 ENCOUNTER — Other Ambulatory Visit: Payer: Self-pay | Admitting: Oncology

## 2010-12-10 DIAGNOSIS — Z0000 Encounter for general adult medical examination without abnormal findings: Secondary | ICD-10-CM

## 2010-12-10 DIAGNOSIS — Z853 Personal history of malignant neoplasm of breast: Secondary | ICD-10-CM

## 2010-12-10 DIAGNOSIS — Z780 Asymptomatic menopausal state: Secondary | ICD-10-CM

## 2010-12-11 ENCOUNTER — Encounter: Payer: Self-pay | Admitting: Oncology

## 2011-02-05 LAB — CBC
HCT: 38.3 % (ref 36.0–46.0)
Hemoglobin: 13.2 g/dL (ref 12.0–15.0)
MCHC: 34.4 g/dL (ref 30.0–36.0)
MCV: 96 fL (ref 78.0–100.0)
Platelets: 177 10*3/uL (ref 150–400)
RBC: 3.99 MIL/uL (ref 3.87–5.11)
RDW: 12.4 % (ref 11.5–15.5)
WBC: 4.4 10*3/uL (ref 4.0–10.5)

## 2011-02-05 LAB — HCG, SERUM, QUALITATIVE: Preg, Serum: NEGATIVE

## 2011-04-07 NOTE — Discharge Summary (Signed)
NAMEMarland Kitchen  DYMIN, DINGLEDINE                          ACCOUNT NO.:  [id number]   MEDICAL RECORD NO.:  [id number]                   PATIENT TYPE:  INP   LOCATION:  [place]                                 FACILITY:  [facility]   PHYSICIAN:  Marland Kitchen T. Hoxworth, M.D.          DATE OF BIRTH:  1966-02-08   DATE OF ADMISSION:  04/14/2003  DATE OF DISCHARGE:  04/19/2003                                 DISCHARGE SUMMARY   DISCHARGE DIAGNOSES:  Crohn's disease with fistula and abscess.   OPERATIONS AND PROCEDURES:  Resection of terminal ileum, cecum, and appendix  on 04/14/2003.   HISTORY OF PRESENT ILLNESS:  [name] is a 45 year old white female  diagnosed with probable  Crohn's ileitis approximately one year ago.  She  had a history of laparoscopic cholecystectomy in 2002 at which time there  appeared to be mild changes in the terminal ileum consistent with Crohn's.  She developed symptoms about one year ago of intermittent diarrhea and  occasional cramping and blood.  She initially was treated with steroids then  subsequently with Asacol and Entocort.  CT scan at that time revealed  inflammatory changes in the terminal ileum, and GI series showed string  sign.  Over the past year, she has continued to have intermittent pain and  diarrhea but certainly manageable.  She developed more severe epigastric  pain about January of this year and did carry a diagnosis of fibromyalgia as  the cause for this.   About a week ago, she developed more severe acute mid abdominal pain and  nausea and presented to the [facility] Emergency Room.  Lab work and x-rays were  fairly unremarkable, and she was discharged and saw Dr. Cristina Gong in followup.  At this time, she actually was feeling somewhat better, but he obtained a CT  scan and small-bowel series. The small-bowel series showed narrowing of  about the last 20 cm of ileum with several apparent sinus tracts.  CT scan  was obtained which has revealed about a 5 cm  interloop abscess in the right  lower quadrant with thickened bowel loops.  She has continued to have  nagging right lower quadrant discomfort and has been limiting her diet due  to pain.   PAST MEDICAL HISTORY:  Significant only as above plus laparoscopic  cholecystectomy.   MEDICATIONS:  Asacol and Entocort.   ALLERGIES:  No true drug allergies.   REVIEW OF SYSTEMS:  For Review of Systems, see detailed H&P.   PERTINENT PHYSICAL EXAMINATION:  GENERAL: She does not appear toxic.  VITAL SIGNS:  Temperature 99.7, vital signs all within normal limits.  ABDOMEN:  Shows well localized, moderate right lower quadrant tenderness  with fullness in this area.   LABORATORY DATA:  Significant for white count of 12,400.   ASSESSMENT AND PLAN:  A 45 year old white female with apparent Crohn's  ileitis, now one week after acute abdominal pain with persistent right lower  quadrant pain and tenderness and CT scan showing evidence of fistulization  and interloop abscess.  We would like to proceed with resection of her  terminal ileum and cecum, and she is admitted for this purpose following  mechanical antibiotics bowel prep at home.   HOSPITAL COURSE:  The patient was admitted on the morning of the procedure.  Findings in surgery were severely inflamed distal 20 to 25 cm of bowel with  fistulization and abscess formation.  She underwent resection with primary  anastomosis.  She tolerated this procedure well.  She has some expected  incisional pain that was reasonably well controlled.   She was started on a clear liquid diet on the first postoperative day.  By  the second postoperative day, she was having some cramping and back pain but  remained stable.  She was continued on a clear liquid diet, and activity was  increased.  She felt better on the third postoperative day.  She still had  no bowel movement or flatus.  She was advanced to full liquids and switched  to oral pain medication.   By  May 29 and May 30, she continued to steadily improve and began to have  loose bowel movement.  Wound healed primarily without infection. Pain was  well controlled with oral medications prior to discharge.   PATHOLOGY:  Chronic active ileitis with ulcerations, fissures, abscess, and  acute on chronic serositis.   FOLLOW UP:  In my office in one week for staple removal.                                               Marland Kitchen T. Hoxworth, M.D.    [place]  D:  04/29/2003  T:  04/29/2003  Job:  [id number]   cc:   Ronald Lobo, M.D.  [address]  [place], [place] [address]  Fax: [phone]

## 2011-04-07 NOTE — Procedures (Signed)
[place]  Patient:    [name], Janice                       MRN: [id number] Proc. Date: 04/17/01 Adm. Date:  [id number] Attending:  Juanita Craver CC:         Olivia Canter. Theda Sers, M.D.   Procedure Report  DATE OF BIRTH:  24-Jul-1966  REFERRING PHYSICIAN:  Olivia Canter. Theda Sers, M.D.  PROCEDURE PERFORMED:  Colonoscopy.  ENDOSCOPIST:  Nelwyn Salisbury, M.D.  INSTRUMENT USED:  Olympus video colonoscope.  INDICATION FOR PROCEDURE:  Rectal bleeding in a 45 year old white female with history of abdominal cramping, worse with stress, relieved by NuLev; rule out IBD.  PREPROCEDURE PREPARATION:  Informed consent was procured from the patient. The patient was fasted for eight hours prior to the procedure and prepped with a bottle of magnesium citrate and a gallon of NuLytely the night prior to the procedure.  PREPROCEDURE PHYSICAL:  VITAL SIGNS:  Patient had stable vital signs.  NECK:  Supple.  CHEST:  Clear to auscultation.  S1 and S2 regular.  ABDOMEN:  Soft with normal bowel sounds.  DESCRIPTION OF THE PROCEDURE:  The patient was placed in the left lateral decubitus position and sedated with Demerol and Versed.  Once the patient was adequately sedated and maintained on low-flow oxygen and continuous cardiac monitoring, the Olympus video colonoscope was advanced from the rectum to the cecum without difficulty.  Except for small internal hemorrhoids, the entire colonic mucosa appeared healthy with normal vascular pattern.  No erosions, ulcerations, masses or polyps were seen.  Small internal hemorrhoids were appreciated on retroflexion in the rectum.  There was no evidence of diverticulosis.  IMPRESSION:  Healthy-appearing colon up to the cecum except for small internal hemorrhoids.  RECOMMENDATIONS:  The patient has been advised to increase the fluid and fiber in her diet.  She is to maintain a daily diary with regards to her symptoms and to  contact the office as need arises. DD:  04/17/01 TD:  04/18/01 Job: [id number]

## 2011-04-07 NOTE — Op Note (Signed)
Janice [name], Janice [name]                ACCOUNT NO.:  [id number]   MEDICAL RECORD NO.:  [id number]          PATIENT TYPE:  AMB   LOCATION:  [place]                          FACILITY:  [facility]   PHYSICIAN:  Rudell Cobb. Annamaria Boots, M.D.   DATE OF BIRTH:  1966/10/21   DATE OF PROCEDURE:  01/03/2007  DATE OF DISCHARGE:                               OPERATIVE REPORT   PREOPERATIVE DIAGNOSIS:  History of right breast cancer.   POSTOPERATIVE DIAGNOSIS:  History of right breast cancer.   OPERATION:  Removal of Port-A-Cath.   SURGEON:  Rudell Cobb. Annamaria Boots, M.D.   ANESTHESIA:  Local.   OPERATIVE PROCEDURE:  The patient was placed on the table with the arms  down by the side.  The left upper chest was prepped and draped in the  usual fashion.   After obtaining good local anesthesia, the previous transverse incision  was incised and the port exposed.  The catheter was grasped and removed  from the subclavian vein.  With traction on the catheter, both sutures  were then divided that were holding the port in place, and the port  removed.   There was no bleeding, and a subcuticular closure of 4-0 Monocryl with  Dermabond was carried out.  A light dressing was applied.  She was then  allowed to go home.      Rudell Cobb. Annamaria Boots, M.D.  Electronically Signed     PRY/MEDQ  D:  01/03/2007  T:  01/04/2007  Job:  [id number]

## 2011-04-07 NOTE — Consult Note (Signed)
Janice [name], Janice [name]                          ACCOUNT NO.:  [id number]   MEDICAL RECORD NO.:  [id number]                   PATIENT TYPE:  EMS   LOCATION:  [place]                                 FACILITY:  [facility]   PHYSICIAN:  Marland Kitchen T. Hoxworth, M.D.          DATE OF BIRTH:  Dec 17, 1965   DATE OF CONSULTATION:  04/10/2003  DATE OF DISCHARGE:                                   CONSULTATION   CHIEF COMPLAINT:  Abdominal pain.   HISTORY OF PRESENT ILLNESS:  I was asked by Dr. Cristina Gong to evaluate Ms.  Janice [name].  She is a very nice 45 year old white female who was diagnosed with  probable Crohn's ileitis approximately one year ago.  She has a history of  laparoscopic cholecystectomy in 2002, at which time there appeared to be  some mild changes in the terminal ileum consistent with Crohn's.  She  developed symptoms approximately one year ago of intermittent diarrhea with  occasional blood and cramping pain.  She initially was treated with steroids  and subsequently with Asacol and Entocort.  CT scan at that time showed  inflammatory changes in the terminal ileum, and GI series showed a string  sign.  Over the past one year, she has continued to have intermittent pain  and diarrhea but certainly manageable.  She also developed more severe  epigastric pain in about January of this year and did carry a diagnosis of  fibromyalgia as a cause for this.  She was going along in about her usual  state until about one week ago when she developed more acute, severe mid  abdominal pain and nausea.  She presented to the [facility] emergency room and was  evaluated with lab work and x-rays that were fairly unremarkable and saw Dr.  Cristina Gong back.  This acute episode improved, but due to change in symptoms,  he reevaluated her with a small-bowel series and CT scan.   I have reviewed the small-bowel series and CT scan.  The small-bowel series  shows narrowing of about approximately the last 20 cm of ileum with  several,  what appear to be, sinus tracks.  There is also mass effect, suggesting  abscess.  CT scan of the abdomen was performed today, which shows an  approximately 5-cm interloop abscess in the right lower quadrant with  surrounding thickened bowel loops.   Currently, she is feeling actually better than she did one week ago.  She  has some nagging right lower quadrant discomfort, but this is not severe.  She has had no fever or chills.  Her bowels are moving fairly normal.  She  has limited her diet somewhat but is tolerating this without nausea,  vomiting, or bloating.  No melena or hematochezia.   PAST MEDICAL HISTORY:  Significant only as above with lap chole and  diagnosis of the Crohn's disease.   CURRENT MEDICATIONS:  1. Asacol.  2. Entocort.   ALLERGIES:  She has had some vomiting one time with PERCOCET but no true  allergies.   SOCIAL HISTORY:  She is married, currently employed.  Does not smoke  cigarettes and drinks alcohol occasionally.   FAMILY HISTORY:  No history of inflammatory disease of the bowel, colon  cancer.   REVIEW OF SYSTEMS:  GENERAL:  No fever, chills.  She has lost about 5 pounds  over the last couple of weeks.  RESPIRATORY:  No shortness of breath, cough.  CARDIAC:  No chest pain, palpitations.  GU:  No urinary burning, frequency.  ORTHO:  She does have some significant right sacroiliac pain with the  current illness.  No other joint pain or swelling.   PHYSICAL EXAMINATION:  VITAL SIGNS:  Temperature is 99.7, pulse 77,  respirations 18, blood pressure is 132/82.  GENERAL:  She is a healthy-appearing white female who does not appear at all  ill or toxic.  SKIN:  Warm and dry without rash or infection.  HEENT:  No masses, adenopathy, or thyromegaly.  LUNGS:  Clear to auscultation.  CARDIAC:  Regular rate and rhythm without murmurs.  ABDOMEN:  There is well-localized, moderate right lower quadrant tenderness.  No peritoneal signs.  No palpable  mass.  EXTREMITIES:  No edema or deformity.   LABORATORY DATA:  Most recent blood work is from the emergency room on Apr 04, 2003, showing a white count of 12,400, normal platelet count, hemoglobin  13.5.  Electrolytes, protein, albumin, amylase, lipase, UA, all within  normal limits.   ASSESSMENT AND PLAN:  A 45 year old white female with Crohn's ileitis.  Clinically, she appears very stable after an acute episode of abdominal pain  about one week ago.  Workup reveals probable fistulization from her terminal  ileum with a 5-cm interloop abscess.  The patient does not appear ill or  toxic in any way and I believe can be managed short-term preoperatively as  an outpatient.  I believe she will require resection of her terminal ileum  for severe Crohn's disease with fistula and abscess.  I am going to start  her on oral antibiotics with Keflex and Flagyl.  We will discharge her home,  and surgery is tentatively planned for approximately four days, and we will  be able to prep her bowel and do this electively.  She understands for any  worsening pain or fever, she is to call immediately.                                               Darene Lamer. Hoxworth, M.D.    [place]  D:  04/10/2003  T:  04/11/2003  Job:  [id number]   cc:   Ronald Lobo, M.D.  [address]  [place], [place] [address]  Fax: [phone]

## 2011-07-17 ENCOUNTER — Ambulatory Visit
Admission: RE | Admit: 2011-07-17 | Discharge: 2011-07-17 | Disposition: A | Discharge status: Home / Self Care | Payer: BC Managed Care – PPO | Source: Ambulatory Visit | Attending: Oncology | Admitting: Oncology

## 2011-07-17 DIAGNOSIS — Z853 Personal history of malignant neoplasm of breast: Secondary | ICD-10-CM

## 2011-07-17 DIAGNOSIS — Z0000 Encounter for general adult medical examination without abnormal findings: Secondary | ICD-10-CM

## 2011-07-17 DIAGNOSIS — Z780 Asymptomatic menopausal state: Secondary | ICD-10-CM

## 2011-07-17 LAB — HM DEXA SCAN

## 2011-07-19 LAB — HM PAP SMEAR

## 2011-09-07 ENCOUNTER — Other Ambulatory Visit: Payer: Self-pay | Admitting: Oncology

## 2011-09-07 ENCOUNTER — Encounter (HOSPITAL_BASED_OUTPATIENT_CLINIC_OR_DEPARTMENT_OTHER): Payer: BC Managed Care – PPO | Admitting: Oncology

## 2011-09-07 DIAGNOSIS — C50919 Malignant neoplasm of unspecified site of unspecified female breast: Secondary | ICD-10-CM

## 2011-09-07 DIAGNOSIS — Z170 Estrogen receptor positive status [ER+]: Secondary | ICD-10-CM

## 2011-09-07 LAB — COMPREHENSIVE METABOLIC PANEL
ALT: 12 U/L (ref 0–35)
AST: 16 U/L (ref 0–37)
Albumin: 3.7 g/dL (ref 3.5–5.2)
Alkaline Phosphatase: 53 U/L (ref 39–117)
BUN: 11 mg/dL (ref 6–23)
CO2: 30 mEq/L (ref 19–32)
Calcium: 9.9 mg/dL (ref 8.4–10.5)
Chloride: 104 mEq/L (ref 96–112)
Creatinine, Ser: 0.68 mg/dL (ref 0.50–1.10)
Glucose, Bld: 91 mg/dL (ref 70–99)
Potassium: 3.7 mEq/L (ref 3.5–5.3)
Sodium: 140 mEq/L (ref 135–145)
Total Bilirubin: 0.4 mg/dL (ref 0.3–1.2)
Total Protein: 6.8 g/dL (ref 6.0–8.3)

## 2011-09-07 LAB — CBC WITH DIFFERENTIAL/PLATELET
BASO%: 0.2 % (ref 0.0–2.0)
Basophils Absolute: 0 10*3/uL (ref 0.0–0.1)
EOS%: 2.5 % (ref 0.0–7.0)
Eosinophils Absolute: 0.1 10*3/uL (ref 0.0–0.5)
HCT: 36 % (ref 34.8–46.6)
HGB: 12.6 g/dL (ref 11.6–15.9)
LYMPH%: 26.3 % (ref 14.0–49.7)
MCH: 33 pg (ref 25.1–34.0)
MCHC: 35 g/dL (ref 31.5–36.0)
MCV: 94.4 fL (ref 79.5–101.0)
MONO#: 0.3 10*3/uL (ref 0.1–0.9)
MONO%: 5.7 % (ref 0.0–14.0)
NEUT#: 3.2 10*3/uL (ref 1.5–6.5)
NEUT%: 65.3 % (ref 38.4–76.8)
Platelets: 193 10*3/uL (ref 145–400)
RBC: 3.81 10*6/uL (ref 3.70–5.45)
RDW: 12.9 % (ref 11.2–14.5)
WBC: 4.9 10*3/uL (ref 3.9–10.3)
lymph#: 1.3 10*3/uL (ref 0.9–3.3)

## 2011-09-07 LAB — LUTEINIZING HORMONE: LH: 12.3 m[IU]/mL

## 2011-09-07 LAB — FOLLICLE STIMULATING HORMONE: FSH: 24.8 m[IU]/mL

## 2011-09-07 LAB — CANCER ANTIGEN 27.29: CA 27.29: 25 U/mL (ref 0–39)

## 2011-09-07 LAB — VITAMIN D 25 HYDROXY (VIT D DEFICIENCY, FRACTURES): Vit D, 25-Hydroxy: 53 ng/mL (ref 30–89)

## 2011-09-14 ENCOUNTER — Encounter (HOSPITAL_BASED_OUTPATIENT_CLINIC_OR_DEPARTMENT_OTHER): Payer: BC Managed Care – PPO | Admitting: Oncology

## 2011-09-14 DIAGNOSIS — Z79810 Long term (current) use of selective estrogen receptor modulators (SERMs): Secondary | ICD-10-CM

## 2011-09-14 DIAGNOSIS — Z170 Estrogen receptor positive status [ER+]: Secondary | ICD-10-CM

## 2011-09-14 DIAGNOSIS — C50919 Malignant neoplasm of unspecified site of unspecified female breast: Secondary | ICD-10-CM

## 2011-09-16 LAB — ESTRADIOL, ULTRA SENS: Estradiol, Ultra Sensitive: 262 pg/mL

## 2012-01-17 ENCOUNTER — Other Ambulatory Visit: Payer: Self-pay | Admitting: *Deleted

## 2012-01-17 DIAGNOSIS — Z853 Personal history of malignant neoplasm of breast: Secondary | ICD-10-CM

## 2012-01-18 MED ORDER — TAMOXIFEN CITRATE 20 MG PO TABS: 20.0000 mg | ORAL_TABLET | Freq: Every day | ORAL | Status: AC

## 2012-01-18 NOTE — Telephone Encounter (Signed)
Pt called to inquire about starting letrozole and instructions-  Per discussion noted per MD need to evaluate for postmenopausel status-. MD reviewed labs from prior visit and noted presently pt is not post menopausel.  MD recommended hold on letrozole - and per recent data pt should resume tamoxfen.  This RN discussed above with pt and called script to pharmacy.

## 2012-03-08 ENCOUNTER — Telehealth: Payer: Self-pay | Admitting: *Deleted

## 2012-03-08 NOTE — Telephone Encounter (Signed)
Pt left message stating onset of new pain x 2 weeks on R rib cage below surgical site.  This Rn returned call to pt.  Janice [name] states area is below breast- approx 1x1" tender to touch area without skin changes or nodule.  Pt states concern due to ongoing discomfort - next appointment is late May " and I don't know if Dr Jana Hakim would want to get a scan or something of this area before my visit.  This note will be given to MD for review and recommendation.  Note pt had mammogram in January of this year.

## 2012-03-13 ENCOUNTER — Other Ambulatory Visit: Payer: Self-pay | Admitting: *Deleted

## 2012-03-13 ENCOUNTER — Encounter: Payer: Self-pay | Admitting: *Deleted

## 2012-03-13 DIAGNOSIS — R52 Pain, unspecified: Secondary | ICD-10-CM

## 2012-03-13 DIAGNOSIS — Z853 Personal history of malignant neoplasm of breast: Secondary | ICD-10-CM | POA: Insufficient documentation

## 2012-03-13 NOTE — Progress Notes (Signed)
Per MD review recommend obtaining xray for evaluation.  Called and discussed with pt who will obtain on 4/25

## 2012-03-14 ENCOUNTER — Ambulatory Visit (HOSPITAL_COMMUNITY)
Admission: RE | Admit: 2012-03-14 | Discharge: 2012-03-14 | Disposition: A | Discharge status: Home / Self Care | Payer: BC Managed Care – PPO | Source: Ambulatory Visit | Attending: Oncology | Admitting: Oncology

## 2012-03-14 ENCOUNTER — Ambulatory Visit (HOSPITAL_BASED_OUTPATIENT_CLINIC_OR_DEPARTMENT_OTHER): Payer: BC Managed Care – PPO | Admitting: Physician Assistant

## 2012-03-14 ENCOUNTER — Encounter: Payer: Self-pay | Admitting: Physician Assistant

## 2012-03-14 VITALS — BP 132/74 | HR 75 | Temp 98.7°F | Ht 67.5 in | Wt 176.6 lb

## 2012-03-14 DIAGNOSIS — R079 Chest pain, unspecified: Secondary | ICD-10-CM

## 2012-03-14 DIAGNOSIS — Z170 Estrogen receptor positive status [ER+]: Secondary | ICD-10-CM

## 2012-03-14 DIAGNOSIS — C50419 Malignant neoplasm of upper-outer quadrant of unspecified female breast: Secondary | ICD-10-CM

## 2012-03-14 DIAGNOSIS — Z853 Personal history of malignant neoplasm of breast: Secondary | ICD-10-CM

## 2012-03-14 DIAGNOSIS — R52 Pain, unspecified: Secondary | ICD-10-CM

## 2012-03-14 DIAGNOSIS — N644 Mastodynia: Secondary | ICD-10-CM | POA: Insufficient documentation

## 2012-03-14 MED ORDER — NAPROXEN 500 MG PO TABS: 500.0000 mg | ORAL_TABLET | Freq: Two times a day (BID) | ORAL | Status: AC

## 2012-03-14 NOTE — Progress Notes (Signed)
ID: KATRICE GOEL   DOB: May 23, 1966  MR#: [id number]  HXT#:[id number]  HISTORY OF PRESENT ILLNESS: Marieta had a screening mammogram in October 2006, which showed some calcifications in the right breast.  Diagnostic mammogram of the right breast September 05, 2005 showed these calcifications to be most likely benign, but a six-month mammogram was recommended.  She then had a unilateral right diagnostic mammogram on February 22, 2006, which showed some increase in the number of microcalcifications in the right breast upper-outer quadrant as compared to the prior study.  The same day a biopsy was performed and showed ([id number] [phone]) an invasive ductal carcinoma, which was strongly ER and PR positive, with an elevated proliferation marker at 31%.  Hercept test negative and negative for HER-2 amplification by FISH.    With this information, the patient was referred to Dr. Annamaria Boots, and MRI of both breasts was obtained April 14. This showed only the solitary area of abnormal enhancement corresponding to the previously biopsied area.  There was no obvious lymphadenopathy.    Accordingly, on March 12, 2006, the patient proceeded to definitive local surgery.  Unfortunately, the sentinel lymph node was positive, so she proceeded to full axillary dissection, as well as of course the right lumpectomy.  The final pathology ([id number]) showed a 1.2 cm infiltrating ductal carcinoma, grade 3, with a total of 2 of 23 lymph nodes involved, T1c N1 invasive ductal carcinoma. (an additional lymph node had isolated tumor cells).  Margins were negative and ample.  LVI was present.    Patient was treated patient was treated in the adjuvant setting with 4 cycles of dose dense doxorubicin/cyclophosphamide followed by weekly paclitaxel x12. She received radiation therapy, completed January 2008 at which time she began on tamoxifen. Continues on tamoxifen 20 mg daily with good tolerance.   INTERVAL HISTORY: Jadea returns today in  between normal followup visits with complaints of pain in the right ribs. She contacted our office last week with increased pain x 2 weeks in the anterior ribs, approximally 1 inch below the right breast. A chest x-ray today was unremarkable. The pain has not worsened, and in fact if anything, has improved slightly over the last 2 days. Maansi is not taking pain medication. She does tell me they have been doing a lot of yard work and clearing out the natural area in the yard over the past 2 weeks. The pain worsens slightly when she stretches a certain direction.  REVIEW OF SYSTEMS: Shaterria has had no known injuries. No redness or skin changes. No rashes. No fevers, chills, or night sweats. No cough, phlegm production, or shortness of breath. No pain with deep inspiration or expiration.  A detailed review of systems is otherwise noncontributory.   PAST MEDICAL HISTORY: Past Medical History  Diagnosis Date  . History of breast cancer 03/13/2012    PAST SURGICAL HISTORY: History reviewed. No pertinent past surgical history.  FAMILY HISTORY History reviewed. No pertinent family history. The patient's father is alive at age 15.  The patient's mother is alive at age 33.  The patient has a sister, who had cervical cancer in her 68s, but is now fine.  There is no history of breast or ovarian cancer in the family.  GYNECOLOGIC HISTORY: She is GX P2, first pregnancy age 19.  She nursed a little bit with both pregnancies.  Last menstrual period was the first half of April.  SOCIAL HISTORY:  The patient is a homemaker.  She has 2 children in  high school. Her husband, Sherren Mocha, is in the building trade.  The patient attends [employer].      ADVANCED DIRECTIVES:  HEALTH MAINTENANCE: History  Substance Use Topics  . Smoking status: Never Smoker   . Smokeless tobacco: Never Used  . Alcohol Use: No     Colonoscopy:  PAP:  Bone density:  Lipid panel:  No Known Allergies  Current  Outpatient Prescriptions  Medication Sig Dispense Refill  . calcium carbonate (OS-CAL) 600 MG TABS Take 600 mg by mouth 2 (two) times daily with a meal.      . cholecalciferol (VITAMIN D) 400 UNITS TABS Take 5,000 Units by mouth daily.      Marland Kitchen glucosamine-chondroitin 500-400 MG tablet Take 1 tablet by mouth daily.      . Multiple Vitamin (MULTIVITAMIN) tablet Take 1 tablet by mouth daily.      . tamoxifen (NOLVADEX) 20 MG tablet Take 20 mg by mouth daily.      . naproxen (NAPROSYN) 500 MG tablet Take 1 tablet (500 mg total) by mouth 2 (two) times daily with a meal.  28 tablet  0    OBJECTIVE: Filed Vitals:   03/14/12 1558  BP: 132/74  Pulse: 75  Temp: 98.7 F (37.1 C)     Body mass index is 27.25 kg/(m^2).    ECOG FS: 0  Physical Exam: Nodes:  No cervical, supraclavicular, or axillary lymphadenopathy palpated.  Breast Exam:  Right breast is status post lumpectomy, slightly tender to palpation, but with no suspicious nodularity or skin changes. No evidence of local recurrence. Left breast is benign, no masses, skin changes, or nipple inversion.  Lungs:  Clear to auscultation bilaterally.  No crackles, rhonchi, or wheezes.   Heart:  Regular rate and rhythm.   Abdomen:  Soft, nontender.  Positive bowel sounds.  No organomegaly or masses palpated.   Musculoskeletal:  No focal spinal tenderness to palpation. There is a generalized area of mild tenderness to palpation in the anterior right ribs, 1-2 inches below the inframammary fold. No obvious skin changes are noted. No edema noted. Extremities:  Benign.  No peripheral edema or cyanosis.   Skin:  Benign.   Neuro:  Nonfocal.    LAB RESULTS: Lab Results  Component Value Date   WBC 4.9 09/07/2011   NEUTROABS 3.2 09/07/2011   HGB 12.6 09/07/2011   HCT 36.0 09/07/2011   MCV 94.4 09/07/2011   PLT 193 09/07/2011      Chemistry      Component Value Date/Time   NA 140 09/07/2011 1457   K 3.7 09/07/2011 1457   CL 104 09/07/2011 1457     CO2 30 09/07/2011 1457   BUN 11 09/07/2011 1457   CREATININE 0.68 09/07/2011 1457      Component Value Date/Time   CALCIUM 9.9 09/07/2011 1457   ALKPHOS 53 09/07/2011 1457   AST 16 09/07/2011 1457   ALT 12 09/07/2011 1457   BILITOT 0.4 09/07/2011 1457       Lab Results  Component Value Date   LABCA2 25 09/07/2011   LABCA2 25 09/07/2011    STUDIES: Dg Chest 2 View  03/14/2012  *RADIOLOGY REPORT*  Clinical Data: 46 year old female with pain in the right breast. History breast cancer.  CHEST - 2 VIEW  Comparison: 09/09/2008 and earlier.  Findings: Stable and normal lung volumes.  Cardiac size and mediastinal contours are within normal limits.  Visualized tracheal air column is within normal limits.  No pneumothorax, pulmonary edema,  pleural effusion or acute pulmonary opacity.  Right upper quadrant surgical clips. Stable visualized osseous structures. Postoperative changes to the right axilla and chest wall.  IMPRESSION: No acute or metastatic cardiopulmonary abnormality.  Original Report Authenticated By: Randall An, M.D.    ASSESSMENT: 46 year old [place] woman   (1)  status post right lumpectomy and axillary lymph node dissection April of 2007 for a T1c N1 grade 2 estrogen and progesterone receptor positive, HER-2 negative invasive ductal carcinoma with an MIB-1 of 31%   (2)   treated adjuvantly with Cytoxan and Adriamycin x4 in dose dense fashion followed by weekly paclitaxel x12   (3)  followed by radiation completed in January of 2008 at which time she was started on tamoxifen.   PLAN: I really think what Latreece is experiencing is musculoskeletal, perhaps a muscle strain or pull from some of the yard work she was doing. The fact that it is gradually improving is a good sign. I have given her a prescription today for naproxen, 500 mg by mouth twice a day with food for the next 10-14 days. She scheduled to return in the next 4 weeks for repeat labs and followup with Dr.  Jana Hakim. We will reassess at that time, and certainly she will call us prior to that appointment if her pain worsens.  Toni Hoffmeister    03/14/2012

## 2012-03-28 ENCOUNTER — Other Ambulatory Visit (HOSPITAL_BASED_OUTPATIENT_CLINIC_OR_DEPARTMENT_OTHER): Payer: BC Managed Care – PPO | Admitting: Lab

## 2012-03-28 DIAGNOSIS — C50419 Malignant neoplasm of upper-outer quadrant of unspecified female breast: Secondary | ICD-10-CM

## 2012-03-28 DIAGNOSIS — Z170 Estrogen receptor positive status [ER+]: Secondary | ICD-10-CM

## 2012-03-28 LAB — CBC WITH DIFFERENTIAL/PLATELET
BASO%: 0.2 % (ref 0.0–2.0)
Basophils Absolute: 0 10*3/uL (ref 0.0–0.1)
EOS%: 1.7 % (ref 0.0–7.0)
Eosinophils Absolute: 0.1 10*3/uL (ref 0.0–0.5)
HCT: 36.7 % (ref 34.8–46.6)
HGB: 12.9 g/dL (ref 11.6–15.9)
LYMPH%: 26.1 % (ref 14.0–49.7)
MCH: 32.7 pg (ref 25.1–34.0)
MCHC: 35.1 g/dL (ref 31.5–36.0)
MCV: 93.1 fL (ref 79.5–101.0)
MONO#: 0.3 10*3/uL (ref 0.1–0.9)
MONO%: 5.2 % (ref 0.0–14.0)
NEUT#: 3.2 10*3/uL (ref 1.5–6.5)
NEUT%: 66.8 % (ref 38.4–76.8)
Platelets: 178 10*3/uL (ref 145–400)
RBC: 3.94 10*6/uL (ref 3.70–5.45)
RDW: 13 % (ref 11.2–14.5)
WBC: 4.9 10*3/uL (ref 3.9–10.3)
lymph#: 1.3 10*3/uL (ref 0.9–3.3)
nRBC: 0 % (ref 0–0)

## 2012-03-29 LAB — COMPREHENSIVE METABOLIC PANEL
ALT: 12 U/L (ref 0–35)
AST: 15 U/L (ref 0–37)
Albumin: 4.2 g/dL (ref 3.5–5.2)
Alkaline Phosphatase: 47 U/L (ref 39–117)
BUN: 11 mg/dL (ref 6–23)
CO2: 29 mEq/L (ref 19–32)
Calcium: 9.5 mg/dL (ref 8.4–10.5)
Chloride: 105 mEq/L (ref 96–112)
Creatinine, Ser: 0.87 mg/dL (ref 0.50–1.10)
Glucose, Bld: 99 mg/dL (ref 70–99)
Potassium: 3.9 mEq/L (ref 3.5–5.3)
Sodium: 141 mEq/L (ref 135–145)
Total Bilirubin: 0.6 mg/dL (ref 0.3–1.2)
Total Protein: 6.6 g/dL (ref 6.0–8.3)

## 2012-03-29 LAB — LUTEINIZING HORMONE: LH: 24 m[IU]/mL

## 2012-03-29 LAB — VITAMIN D 25 HYDROXY (VIT D DEFICIENCY, FRACTURES): Vit D, 25-Hydroxy: 70 ng/mL (ref 30–89)

## 2012-03-29 LAB — FOLLICLE STIMULATING HORMONE: FSH: 55.2 m[IU]/mL

## 2012-04-11 ENCOUNTER — Other Ambulatory Visit: Payer: Self-pay | Admitting: Oncology

## 2012-04-11 ENCOUNTER — Ambulatory Visit (HOSPITAL_BASED_OUTPATIENT_CLINIC_OR_DEPARTMENT_OTHER): Payer: BC Managed Care – PPO | Admitting: Oncology

## 2012-04-11 ENCOUNTER — Telehealth: Payer: Self-pay | Admitting: Oncology

## 2012-04-11 VITALS — BP 121/76 | HR 80 | Temp 98.2°F | Ht 67.5 in | Wt 172.4 lb

## 2012-04-11 DIAGNOSIS — Z79810 Long term (current) use of selective estrogen receptor modulators (SERMs): Secondary | ICD-10-CM

## 2012-04-11 DIAGNOSIS — C50919 Malignant neoplasm of unspecified site of unspecified female breast: Secondary | ICD-10-CM

## 2012-04-11 DIAGNOSIS — Z853 Personal history of malignant neoplasm of breast: Secondary | ICD-10-CM

## 2012-04-11 DIAGNOSIS — Z170 Estrogen receptor positive status [ER+]: Secondary | ICD-10-CM

## 2012-04-11 NOTE — Progress Notes (Signed)
ID: Janice [name]   DOB: 1966/07/16  MR#: [id number]  KDX#:[id number]  HISTORY OF PRESENT ILLNESS: She had a screening mammogram in October 2007, which showed some calcifications in the right breast.  Diagnostic mammogram of the right breast October 17, showed these calcifications to be most likely benign, but a six-month mammogram was recommended.  She then had a unilateral right diagnostic mammogram on February 22, 2006, which showed some increase in the number of microcalcifications in the right breast upper-outer quadrant as compared to the prior study.  The same day a biopsy was performed and showed ([id number] and [id number]) an invasive ductal carcinoma, which was strongly ER and PR positive, with an elevated proliferation marker at 31%.  Hercept test negative and negative for HER-2 amplification by FISH.    With this information, the patient was referred to Dr. Annamaria Boots, and MRI of both breasts was obtained April 14. This showed only the solitary area of abnormal enhancement corresponding to the previously biopsied area.  There was no obvious lymphadenopathy.    Accordingly, on March 12, 2006, the patient proceeded to definitive local surgery.  Unfortunately, the sentinel lymph node was positive, so she proceeded to full axillary dissection, as well as of course the right lumpectomy.  The final pathology ([id number]) showed a 1.2 cm infiltrating ductal carcinoma, grade 3, with a total of 2 of 23 lymph nodes involved (an additional lymph node had isolated tumor cells).  Margins were negative and ample.  LVI was present.  Her subsequent history is as detailed below.  INTERVAL HISTORY: Janice [name] returns today for followup of her breast cancer. The interval history is generally unremarkable. Her older child will be going to college in the fall. Her younger 1 will be a senior in high school.  REVIEW OF SYSTEMS: She had right flank pain earlier this year, evaluated with a chest x-ray which was unremarkable. That has  completely resolved. She has an unusual symptom: She tells me sometimes when she grab something a little hard her veins in the volar aspect of her fingers pop and break under the skin. I don't know what might be causing this. There is nothing by exam today of that would help me with this. I don't think it has anything to do with any treatment she has received. If it happens again she will stop in and let me look at her hand of after it happens. She continues on tamoxifen, deciding against aromatase inhibitors because she is still very irregularly spotting, and is tolerating the tamoxifen with no side effects that she is aware of. Otherwise a detailed review of systems today is entirely noncontributory  PAST MEDICAL HISTORY: Past Medical History  Diagnosis Date  . History of breast cancer 03/13/2012  Significant chiefly for Crohn's disease.  This is followed by Dr. Cristina Gong.  The patient has had prior surgery for Crohn's.  She has also had removal of a left breast cyst remotely by Dr. Zettie Pho.  The Crohn's surgery was performed by Dr. Excell Seltzer.    FAMILY HISTORY The patient's father is alive at age 17.  The patient's mother is alive at age 21.  The patient has a sister, who had cervical cancer in her 46s, but is now fine.  There is no history of breast or ovarian cancer in the family.  GYNECOLOGIC HISTORY: She is GX P2, first pregnancy age 73.  She nursed a little bit with both pregnancies.  Last menstrual period was the first half of April.  SOCIAL HISTORY:  The patient is a homemaker.  Her children are 39 and 17.  Her husband, Janice [name], is in the building trade. The patient attends [employer].     ADVANCED DIRECTIVES: In  HEALTH MAINTENANCE: History  Substance Use Topics  . Smoking status: Never Smoker   . Smokeless tobacco: Never Used  . Alcohol Use: No     Colonoscopy: [facility]ccini  PAP: Romaine  Bone density:  Lipid panel: due 2014  No Known Allergies  Current Outpatient  Prescriptions  Medication Sig Dispense Refill  . calcium carbonate (OS-CAL) 600 MG TABS Take 600 mg by mouth 2 (two) times daily with a meal.      . cholecalciferol (VITAMIN D) 400 UNITS TABS Take 5,000 Units by mouth daily.      Marland Kitchen glucosamine-chondroitin 500-400 MG tablet Take 1 tablet by mouth daily.      . Multiple Vitamin (MULTIVITAMIN) tablet Take 1 tablet by mouth daily.      . naproxen (NAPROSYN) 500 MG tablet Take 1 tablet (500 mg total) by mouth 2 (two) times daily with a meal.  28 tablet  0  . tamoxifen (NOLVADEX) 20 MG tablet Take 20 mg by mouth daily.        OBJECTIVE: Middle-aged white woman who appears fit Filed Vitals:   04/11/12 1133  BP: 121/76  Pulse: 80  Temp: 98.2 F (36.8 C)     Body mass index is 26.60 kg/(m^2).    ECOG FS: 0  Sclerae unicteric Oropharynx clear No peripheral adenopathy Lungs no rales or rhonchi Heart regular rate and rhythm Abd benign MSK no focal spinal tenderness, no peripheral edema Neuro: nonfocal Breasts: The right breast is status post lumpectomy and sentinel lymph node sampling. There is no evidence of local recurrence. Left breast is unremarkable.  LAB RESULTS: FSH 55.2,  LH 24.0 Lab Results  Component Value Date   WBC 4.9 03/28/2012   NEUTROABS 3.2 03/28/2012   HGB 12.9 03/28/2012   HCT 36.7 03/28/2012   MCV 93.1 03/28/2012   PLT 178 03/28/2012      Chemistry      Component Value Date/Time   NA 141 03/28/2012 1315   K 3.9 03/28/2012 1315   CL 105 03/28/2012 1315   CO2 29 03/28/2012 1315   BUN 11 03/28/2012 1315   CREATININE 0.87 03/28/2012 1315      Component Value Date/Time   CALCIUM 9.5 03/28/2012 1315   ALKPHOS 47 03/28/2012 1315   AST 15 03/28/2012 1315   ALT 12 03/28/2012 1315   BILITOT 0.6 03/28/2012 1315       Lab Results  Component Value Date   LABCA2 25 09/07/2011   LABCA2 25 09/07/2011    No components found with this basename: [id number]    No results found for this basename: INR:1;PROTIME:1 in the last 168  hours  Urinalysis No results found for this basename: colorurine, appearanceur, labspec, phurine, glucoseu, hgbur, bilirubinur, ketonesur, proteinur, urobilinogen, nitrite, leukocytesur    STUDIES: Dg Chest 2 View  03/14/2012  *RADIOLOGY REPORT*  Clinical Data: 46 year old female with pain in the right breast. History breast cancer.  CHEST - 2 VIEW  Comparison: 09/09/2008 and earlier.  Findings: Stable and normal lung volumes.  Cardiac size and mediastinal contours are within normal limits.  Visualized tracheal air column is within normal limits.  No pneumothorax, pulmonary edema, pleural effusion or acute pulmonary opacity.  Right upper quadrant surgical clips. Stable visualized osseous structures. Postoperative changes to the right axilla and chest wall.  IMPRESSION: No acute or metastatic cardiopulmonary abnormality.  Original Report Authenticated By: Randall An, M.D.    ASSESSMENT: 46 year old [place] woman status post right lumpectomy and axillary lymph node dissection April of 2007 for a T1c N1 grade 2 estrogen and progesterone receptor positive, HER-2 negative invasive ductal carcinoma with an MIB-1 of 31% treated adjuvantly with Cytoxan and Adriamycin x4 in dose dense fashion followed by weekly paclitaxel x12 followed by radiation completed in January of 2008 at which time she was started on tamoxifen.   PLAN: We are continuing tamoxifen likely to a total of 10 years. If she sees Dr. Joan Flores in October she can see me next April. Next mammogram is due August of this year. Fatina knows to call for any problems that may develop before the next visit.   Rella Egelston C    04/11/2012

## 2012-04-11 NOTE — Telephone Encounter (Signed)
gve the pt her April 2014 appt calendar

## 2012-04-13 LAB — ESTRADIOL, ULTRA SENS: Estradiol, Ultra Sensitive: 8 pg/mL

## 2012-07-17 ENCOUNTER — Other Ambulatory Visit: Payer: Self-pay | Admitting: Obstetrics and Gynecology

## 2012-07-17 ENCOUNTER — Other Ambulatory Visit: Payer: Self-pay | Admitting: Oncology

## 2012-07-17 DIAGNOSIS — Z853 Personal history of malignant neoplasm of breast: Secondary | ICD-10-CM

## 2012-07-25 ENCOUNTER — Ambulatory Visit
Admission: RE | Admit: 2012-07-25 | Discharge: 2012-07-25 | Disposition: A | Discharge status: Home / Self Care | Payer: BC Managed Care – PPO | Source: Ambulatory Visit | Attending: Obstetrics and Gynecology | Admitting: Obstetrics and Gynecology

## 2012-07-25 DIAGNOSIS — Z853 Personal history of malignant neoplasm of breast: Secondary | ICD-10-CM

## 2012-07-25 LAB — HM MAMMOGRAPHY

## 2012-07-26 ENCOUNTER — Other Ambulatory Visit: Payer: Self-pay | Admitting: Obstetrics and Gynecology

## 2012-07-26 DIAGNOSIS — Z853 Personal history of malignant neoplasm of breast: Secondary | ICD-10-CM

## 2012-07-26 DIAGNOSIS — Z9889 Other specified postprocedural states: Secondary | ICD-10-CM

## 2013-01-13 ENCOUNTER — Other Ambulatory Visit: Payer: Self-pay | Admitting: Oncology

## 2013-01-13 DIAGNOSIS — Z853 Personal history of malignant neoplasm of breast: Secondary | ICD-10-CM

## 2013-02-18 ENCOUNTER — Other Ambulatory Visit (HOSPITAL_BASED_OUTPATIENT_CLINIC_OR_DEPARTMENT_OTHER): Payer: BC Managed Care – PPO | Admitting: Lab

## 2013-02-18 DIAGNOSIS — C50419 Malignant neoplasm of upper-outer quadrant of unspecified female breast: Secondary | ICD-10-CM

## 2013-02-18 DIAGNOSIS — Z853 Personal history of malignant neoplasm of breast: Secondary | ICD-10-CM

## 2013-02-18 LAB — CBC WITH DIFFERENTIAL/PLATELET
BASO%: 0.4 % (ref 0.0–2.0)
Basophils Absolute: 0 10*3/uL (ref 0.0–0.1)
EOS%: 2.6 % (ref 0.0–7.0)
Eosinophils Absolute: 0.1 10*3/uL (ref 0.0–0.5)
HCT: 37 % (ref 34.8–46.6)
HGB: 12.9 g/dL (ref 11.6–15.9)
LYMPH%: 31 % (ref 14.0–49.7)
MCH: 32.3 pg (ref 25.1–34.0)
MCHC: 34.7 g/dL (ref 31.5–36.0)
MCV: 93 fL (ref 79.5–101.0)
MONO#: 0.3 10*3/uL (ref 0.1–0.9)
MONO%: 6.6 % (ref 0.0–14.0)
NEUT#: 2.4 10*3/uL (ref 1.5–6.5)
NEUT%: 59.4 % (ref 38.4–76.8)
Platelets: 151 10*3/uL (ref 145–400)
RBC: 3.98 10*6/uL (ref 3.70–5.45)
RDW: 12.2 % (ref 11.2–14.5)
WBC: 4.1 10*3/uL (ref 3.9–10.3)
lymph#: 1.3 10*3/uL (ref 0.9–3.3)

## 2013-02-18 LAB — LIPID PANEL
Cholesterol: 137 mg/dL (ref 0–200)
HDL: 57 mg/dL (ref >39)
LDL Cholesterol: 50 mg/dL (ref 0–99)
Total CHOL/HDL Ratio: 2.4 Ratio
Triglycerides: 150 mg/dL — ABNORMAL HIGH (ref <150)
VLDL: 30 mg/dL (ref 0–40)

## 2013-02-18 LAB — COMPREHENSIVE METABOLIC PANEL (CC13)
ALT: 16 U/L (ref 0–55)
AST: 18 U/L (ref 5–34)
Albumin: 3.8 g/dL (ref 3.5–5.0)
Alkaline Phosphatase: 53 U/L (ref 40–150)
BUN: 12.9 mg/dL (ref 7.0–26.0)
CO2: 27 mEq/L (ref 22–29)
Calcium: 9.3 mg/dL (ref 8.4–10.4)
Chloride: 106 mEq/L (ref 98–107)
Creatinine: 0.8 mg/dL (ref 0.6–1.1)
Glucose: 91 mg/dl (ref 70–99)
Potassium: 3.9 mEq/L (ref 3.5–5.1)
Sodium: 142 mEq/L (ref 136–145)
Total Bilirubin: 0.74 mg/dL (ref 0.20–1.20)
Total Protein: 6.8 g/dL (ref 6.4–8.3)

## 2013-03-04 ENCOUNTER — Ambulatory Visit (HOSPITAL_BASED_OUTPATIENT_CLINIC_OR_DEPARTMENT_OTHER): Payer: BC Managed Care – PPO | Admitting: Oncology

## 2013-03-04 ENCOUNTER — Telehealth: Payer: Self-pay | Admitting: *Deleted

## 2013-03-04 VITALS — BP 128/81 | HR 85 | Temp 98.7°F | Resp 20 | Ht 67.5 in | Wt 179.5 lb

## 2013-03-04 DIAGNOSIS — Z170 Estrogen receptor positive status [ER+]: Secondary | ICD-10-CM

## 2013-03-04 DIAGNOSIS — R0602 Shortness of breath: Secondary | ICD-10-CM

## 2013-03-04 DIAGNOSIS — C50419 Malignant neoplasm of upper-outer quadrant of unspecified female breast: Secondary | ICD-10-CM

## 2013-03-04 DIAGNOSIS — Z853 Personal history of malignant neoplasm of breast: Secondary | ICD-10-CM

## 2013-03-04 MED ORDER — TAMOXIFEN CITRATE 20 MG PO TABS: 20.0000 mg | ORAL_TABLET | Freq: Every day | ORAL | Status: DC

## 2013-03-04 NOTE — Progress Notes (Signed)
ID: Janice [name]   DOB: Sep 06, 1966  MR#: [id number]  KGM#:[id number]  HISTORY OF PRESENT ILLNESS: She had a screening mammogram in October 2007, which showed some calcifications in the right breast.  Diagnostic mammogram of the right breast October 17, showed these calcifications to be most likely benign, but a six-month mammogram was recommended.  She then had a unilateral right diagnostic mammogram on February 22, 2006, which showed some increase in the number of microcalcifications in the right breast upper-outer quadrant as compared to the prior study.  The same day a biopsy was performed and showed ([id number] and [id number]) an invasive ductal carcinoma, which was strongly ER and PR positive, with an elevated proliferation marker at 31%.  Hercept test negative and negative for HER-2 amplification by FISH.    With this information, the patient was referred to Dr. Annamaria [name], and MRI of both breasts was obtained April 14. This showed only the solitary area of abnormal enhancement corresponding to the previously biopsied area.  There was no obvious lymphadenopathy.    Accordingly, on March 12, 2006, the patient proceeded to definitive local surgery.  Unfortunately, the sentinel lymph node was positive, so she proceeded to full axillary dissection, as well as of course the right lumpectomy.  The final pathology ([id number]) showed a 1.2 cm infiltrating ductal carcinoma, grade 3, with a total of 2 of 23 lymph nodes involved (an additional lymph node had isolated tumor cells).  Margins were negative and ample.  LVI was present.  Her subsequent history is as detailed below.  INTERVAL HISTORY: Janice [name] returns today for followup of her breast cancer. The interval history is unremarkable. Her son who is a Janice [name] in high school was a little late making up his mind about college so he will be [employer] for 1 year before going on in hopefully to Janice [name] in the fall of 2015.  ROS: The patient complains of bilateral chest  wall pain, mostly on the lateral and inferior aspect of both breasts. This is intermittent and seems to be related to her bra. For example it is not in the morning, after not wearing a bra overnight. She has had no unusual headaches, visual changes, cough, phlegm production, pleurisy, shortness of breath, chest pain or pressure, or change in bowel or bladder habits. A detailed review of systems today was negative. She is tolerating her tamoxifen without any unusual side effects.  PAST MEDICAL HISTORY: Past Medical History  Diagnosis Date  . History of breast cancer 03/13/2012  Significant chiefly for Crohn's disease.  This is followed by Dr. Cristina [name].  The patient has had prior surgery for Crohn's.  She has also had removal of a left breast cyst remotely by Dr. Zettie [name].  The Crohn's surgery was performed by Dr. Excell [name].    FAMILY HISTORY The patient's father is alive at age 36.  The patient's mother is alive at age 35.  The patient has a sister, who had cervical cancer in her 76s, but is now fine.  There is no history of breast or ovarian cancer in the family.  GYNECOLOGIC HISTORY: She is GX P2, first pregnancy age 43.  She nursed a little bit with both pregnancies.  Last menstrual period was the first half of April.  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  Her husband, Janice [name], is in the building trade. The patient attends [name].     ADVANCED DIRECTIVES: In  HEALTH MAINTENANCE: History  Substance  Use Topics  . Smoking status: Never Smoker   . Smokeless tobacco: Never Used  . Alcohol Use: No     Colonoscopy: Janice [name]  PAP: Janice [name]  Bone density:  Lipid panel: due 2014  No Known Allergies  Current Outpatient Prescriptions  Medication Sig Dispense Refill  . calcium carbonate (OS-CAL) 600 MG TABS Take 600 mg by mouth 2 (two) times daily with a meal.      . cholecalciferol (VITAMIN D) 400 UNITS TABS Take 5,000 Units by mouth daily.      Marland Kitchen  glucosamine-chondroitin 500-400 MG tablet Take 1 tablet by mouth daily.      . Multiple Vitamin (MULTIVITAMIN) tablet Take 1 tablet by mouth daily.      . naproxen (NAPROSYN) 500 MG tablet Take 1 tablet (500 mg total) by mouth 2 (two) times daily with a meal.  28 tablet  0  . tamoxifen (NOLVADEX) 20 MG tablet TAKE 1 TABLET BY MOUTH EVERY DAY  90 tablet  0   No current facility-administered medications for this visit.    OBJECTIVE: Middle-aged white woman in no acute distress Filed Vitals:   03/04/13 1130  BP: 128/81  Pulse: 85  Temp: 98.7 F (37.1 C)  Resp: 20     Body mass index is 27.68 kg/(m^2).    ECOG FS: 0  Sclerae unicteric Oropharynx clear No peripheral adenopathy Lungs no rales or rhonchi Heart regular rate and rhythm Abd benign MSK no focal spinal tenderness, no peripheral edema; no tenderness to rib cage palpation in the areas of concern, and also no masses or erythema over those same areas. Neuro: nonfocal, well oriented, appropriate affect Breasts: The right breast is status post lumpectomy. There is no evidence of local recurrence. Left breast is unremarkable.  LAB RESULTS: FSH 55.2,  LH 24.0 Lab Results  Component Value Date   WBC 4.1 02/18/2013   NEUTROABS 2.4 02/18/2013   HGB 12.9 02/18/2013   HCT 37.0 02/18/2013   MCV 93.0 02/18/2013   PLT 151 02/18/2013      Chemistry      Component Value Date/Time   NA 142 02/18/2013 0829   NA 141 03/28/2012 1315   K 3.9 02/18/2013 0829   K 3.9 03/28/2012 1315   CL 106 02/18/2013 0829   CL 105 03/28/2012 1315   CO2 27 02/18/2013 0829   CO2 29 03/28/2012 1315   BUN 12.9 02/18/2013 0829   BUN 11 03/28/2012 1315   CREATININE 0.8 02/18/2013 0829   CREATININE 0.87 03/28/2012 1315      Component Value Date/Time   CALCIUM 9.3 02/18/2013 0829   CALCIUM 9.5 03/28/2012 1315   ALKPHOS 53 02/18/2013 0829   ALKPHOS 47 03/28/2012 1315   AST 18 02/18/2013 0829   AST 15 03/28/2012 1315   ALT 16 02/18/2013 0829   ALT 12 03/28/2012 1315   BILITOT 0.74 02/18/2013 0829    BILITOT 0.6 03/28/2012 1315       Lab Results  Component Value Date   LABCA2 25 09/07/2011    No components found with this basename: [id number]    No results found for this basename: INR,  in the last 168 hours  Urinalysis No results found for this basename: colorurine,  appearanceur,  labspec,  phurine,  glucoseu,  hgbur,  bilirubinur,  ketonesur,  proteinur,  urobilinogen,  nitrite,  leukocytesur    STUDIES: Mammography 07/25/2012 was unremarkable  ASSESSMENT: 47 y.o.  [place] [name] woman status post right lumpectomy and axillary lymph node dissection April  of 2007 for a T1c N1, stage IIB invasive ductal carcinoma, grade 2, estrogen and progesterone receptor positive, HER-2 negative  with an MIB-1 of 31%   (1) treated adjuvantly with Cytoxan and Adriamycin x4 in dose dense fashion followed by weekly paclitaxel x12   (2) s/p adjuvant radiation completed in January of 2008   (3) on tamoxifen as of January of 2008, the plan being to go for total of 10 years.   PLAN: [place] looks very healthy and I don't see evidence of disease progression or recurrence. I think the discomfort she has bilaterally around her breasts is going to be due to her bra, and I suggested she get a sports bra aware that for a couple of weeks and then let us know. If the problem persists we will get a chest x-ray. (I offered to get a chest x-ray today but she did not think that was necessary at this point).  She is a ready scheduled to see Dr. Joan Flores later this year. Crisol will see [place] again in one year. She knows to call for any problems that may develop before that visit.  Donathan Buller C    03/04/2013

## 2013-03-04 NOTE — Telephone Encounter (Signed)
appts made and printed...td 

## 2013-03-10 ENCOUNTER — Other Ambulatory Visit: Payer: Self-pay | Admitting: Oncology

## 2013-04-02 ENCOUNTER — Encounter: Payer: Self-pay | Admitting: Obstetrics and Gynecology

## 2013-06-27 ENCOUNTER — Other Ambulatory Visit: Payer: Self-pay | Admitting: Obstetrics and Gynecology

## 2013-06-27 DIAGNOSIS — Z853 Personal history of malignant neoplasm of breast: Secondary | ICD-10-CM

## 2013-06-27 DIAGNOSIS — Z9889 Other specified postprocedural states: Secondary | ICD-10-CM

## 2013-07-25 ENCOUNTER — Ambulatory Visit: Payer: Self-pay | Admitting: Obstetrics and Gynecology

## 2013-07-28 ENCOUNTER — Encounter: Payer: Self-pay | Admitting: Obstetrics and Gynecology

## 2013-07-28 ENCOUNTER — Ambulatory Visit (INDEPENDENT_AMBULATORY_CARE_PROVIDER_SITE_OTHER): Payer: BC Managed Care – PPO | Admitting: Obstetrics and Gynecology

## 2013-07-28 VITALS — BP 122/80 | HR 70 | Ht 67.0 in | Wt 178.0 lb

## 2013-07-28 DIAGNOSIS — Z0000 Encounter for general adult medical examination without abnormal findings: Secondary | ICD-10-CM

## 2013-07-28 DIAGNOSIS — Z01419 Encounter for gynecological examination (general) (routine) without abnormal findings: Secondary | ICD-10-CM

## 2013-07-28 LAB — POCT URINALYSIS DIPSTICK
Bilirubin, UA: NEGATIVE
Blood, UA: NEGATIVE
Glucose, UA: NEGATIVE
Ketones, UA: NEGATIVE
Leukocytes, UA: NEGATIVE
Nitrite, UA: NEGATIVE
Protein, UA: NEGATIVE
Urobilinogen, UA: NEGATIVE
pH, UA: 5

## 2013-07-28 NOTE — Progress Notes (Signed)
Patient ID: Janice [name], female   DOB: 06-Feb-1966, 47 y.o.   MRN: [id number] GYNECOLOGY VISIT  PCP:  Dr. Lurline Del  Referring provider:   HPI: 47 y.o.   Married  Caucasian  female   G2P2002 with No LMP recorded. Patient is not currently having periods (Reason: Perimenopausal).  here for  AEX Patient is taking Tamoxifen for a total of ten years.  Diagnosis 6 years ago.   Status post EMB 07/06/2011 - weakly proliferative endometrium, negative for atypia or malignancy.  No hot flashes.  No vaginal dryness.    Hgb:  PCP Urine:  Neg   GYNECOLOGIC HISTORY: No LMP recorded. Patient is not currently having periods (Reason: Perimenopausal). Sexually active:  yes Partner preference: female Contraception:   postmenopausal Menopausal hormone therapy: no DES exposure:   no Blood transfusions:  no  Sexually transmitted diseases:   no GYN Procedures:  Hysteroscopy with D & C and endometrial polyp removal 1/101/11 Mammogram:  07/2012 wnl: [facility]--has next mammogram scheduled tomorrow.            Pap:  2012 wnl  History of abnormal pap smear:  no   OB History   Grav Para Term Preterm Abortions TAB SAB Ect Mult Living   2 2 2       2        LIFESTYLE: Exercise:    walking           Tobacco:    no Alcohol:       no Drug use:    no  OTHER HEALTH MAINTENANCE: Tetanus/TDap:  ?greater than 10 years Gardisil:  NA Influenza:  Doesn't do yearly Zostavax:  NA  Bone density: 07/17/11 - osteopenia - T score of spine - 0.8, T score of left hip - 1.3 Colonoscopy: 01-14-10 wnl:Dr. Herbie [place] [facility]) next colonoscopy due 12/2014  Cholesterol check: 2013 wnl  Family History  Problem Relation Age of Onset  . Heart attack Father   . Hypertension Maternal Grandmother     Patient Active Problem List   Diagnosis Date Noted  . History of breast cancer 03/13/2012  . Pain 03/13/2012   Past Medical History  Diagnosis Date  . History of breast cancer 03/13/2012  . Crohn's  disease   . Genital warts     as a teenager and were frozen and never returned    Past Surgical History  Procedure Laterality Date  . Colon resection  2004    d/t crohns dz--Dr. Excell Seltzer  . Cholecystectomy    . Hysteroscopy  12-09-2009    polyp resection and D &C --Dr. Joan Flores  . Breast surgery  2007    Rt.lumpectomy--Br. CA, lymph node dissection  1 Pos., Chemo, RX, Tamoxifen  . Endometrial biopsy      11/2009 benign endometrial polyp; 06/2011 wnl    ALLERGIES: Percocet  Current Outpatient Prescriptions  Medication Sig Dispense Refill  . calcium carbonate (OS-CAL) 600 MG TABS Take 600 mg by mouth 2 (two) times daily with a meal.      . cholecalciferol (VITAMIN D) 400 UNITS TABS Take 5,000 Units by mouth daily.      Marland Kitchen glucosamine-chondroitin 500-400 MG tablet Take 1 tablet by mouth daily.      . Multiple Vitamin (MULTIVITAMIN) tablet Take 1 tablet by mouth daily.      . tamoxifen (NOLVADEX) 20 MG tablet Take 1 tablet (20 mg total) by mouth daily.  90 tablet  12   No current facility-administered medications  for this visit.     ROS:  Pertinent items are noted in HPI.  SOCIAL HISTORY:  Married. Two children. Husband is a Museum/gallery curator.   PHYSICAL EXAMINATION:    BP 122/80  Pulse 70  Ht 5' 7"  (1.702 m)  Wt 178 lb (80.74 kg)  BMI 27.87 kg/m2   Wt Readings from Last 3 Encounters:  07/28/13 178 lb (80.74 kg)  03/04/13 179 lb 8 oz (81.421 kg)  04/11/12 172 lb 6.4 oz (78.2 kg)     Ht Readings from Last 3 Encounters:  07/28/13 5' 7"  (1.702 m)  03/04/13 5' 7.5" (1.715 m)  04/11/12 5' 7.5" (1.715 m)    General appearance: alert, cooperative and appears stated age Head: Normocephalic, without obvious abnormality, atraumatic Neck: no adenopathy, supple, symmetrical, trachea midline and thyroid not enlarged, symmetric, no tenderness/mass/nodules Lungs: clear to auscultation bilaterally Breasts: Right breast with right circumferential scar and right axillary scar and right  supra-areolar scar.  No nipple retraction or dimpling, No nipple discharge or bleeding, No axillary or supraclavicular adenopathy, Normal to palpation without dominant masses.  Left breast negative for dominant masses, retractions, nipple discharge, or axillary adenopathy. Heart: regular rate and rhythm Abdomen: vertical midline umbilical incision, soft, non-tender; no masses,  no organomegaly Extremities: extremities normal, atraumatic, no cyanosis or edema Skin: Skin color, texture, turgor normal. No rashes or lesions Lymph nodes: Cervical, supraclavicular, and axillary nodes normal. No abnormal inguinal nodes palpated Neurologic: Grossly normal  Pelvic: External genitalia:  no lesions              Urethra:  normal appearing urethra with no masses, tenderness or lesions              Bartholins and Skenes: normal                 Vagina: normal appearing vagina with normal color and discharge, no lesions              Cervix: normal appearance              Pap and high risk HPV testing done: yes.            Bimanual Exam:  Uterus:  uterus is normal size, shape, consistency and nontender                                      Adnexa: normal adnexa in size, nontender and no masses                                      Rectovaginal: Confirms                                      Anus:  normal sphincter tone, no lesions  ASSESSMENT  Normal gynecologic exam. History of right breast cancer.  On tamoxifen.  PLAN  Mammogram tomorrow at [facility]. Pap smear and high risk HPV testing Counseled on Kegel's exercises Patient will check tetanus vaccine schedule with PCP. Return annually or prn   An After Visit Summary was printed and given to the patient.

## 2013-07-28 NOTE — Patient Instructions (Signed)

## 2013-07-29 ENCOUNTER — Ambulatory Visit
Admission: RE | Admit: 2013-07-29 | Discharge: 2013-07-29 | Disposition: A | Discharge status: Home / Self Care | Payer: BC Managed Care – PPO | Source: Ambulatory Visit | Attending: Obstetrics and Gynecology | Admitting: Obstetrics and Gynecology

## 2013-07-29 DIAGNOSIS — Z9889 Other specified postprocedural states: Secondary | ICD-10-CM

## 2013-07-29 DIAGNOSIS — Z853 Personal history of malignant neoplasm of breast: Secondary | ICD-10-CM

## 2013-07-31 LAB — IPS PAP TEST WITH HPV

## 2013-08-27 ENCOUNTER — Ambulatory Visit: Payer: Self-pay | Admitting: Obstetrics and Gynecology

## 2013-09-03 ENCOUNTER — Ambulatory Visit (INDEPENDENT_AMBULATORY_CARE_PROVIDER_SITE_OTHER): Payer: BC Managed Care – PPO | Admitting: Podiatry

## 2013-09-03 VITALS — BP 132/71 | HR 63 | Resp 16 | Wt 178.0 lb

## 2013-09-03 DIAGNOSIS — M722 Plantar fascial fibromatosis: Secondary | ICD-10-CM

## 2013-09-03 NOTE — Patient Instructions (Signed)
We will call when orthotics returned

## 2013-09-04 NOTE — Progress Notes (Signed)
Subjective:     Patient ID: Janice [name], female   DOB: 12-01-1965, 47 y.o.   MRN: [id number]  HPI patient states that my heel is still hurting but it is improving some. States that when she has her braces on that helps but she feels she needs more support   Review of Systems  All other systems reviewed and are negative.       Objective:   Physical Exam  Nursing note and vitals reviewed. Constitutional: She appears well-developed and well-nourished.  Cardiovascular: Intact distal pulses.   Musculoskeletal: Normal range of motion.   Patient's pain has reduced by about 75%. Still walking with an a propulsive gait and this is a long-term duration problem for her    Assessment:     Plantar fasciitis bilateral left over right of chronic nature with the acute being much bette    Plan:     Education rendered concerning condition and orthotics recommended. Scanned for customized orthotics and gave instructions on physical therapy and supportive shoes

## 2013-09-18 ENCOUNTER — Ambulatory Visit: Payer: BC Managed Care – PPO | Admitting: Podiatrist

## 2013-09-25 ENCOUNTER — Other Ambulatory Visit: Payer: Self-pay

## 2013-10-09 ENCOUNTER — Encounter: Payer: Self-pay | Admitting: Podiatry

## 2013-10-09 ENCOUNTER — Ambulatory Visit (INDEPENDENT_AMBULATORY_CARE_PROVIDER_SITE_OTHER): Payer: BC Managed Care – PPO | Admitting: Podiatry

## 2013-10-09 VITALS — BP 101/68 | HR 68 | Resp 16

## 2013-10-09 DIAGNOSIS — M722 Plantar fascial fibromatosis: Secondary | ICD-10-CM

## 2013-10-10 NOTE — Progress Notes (Signed)
Subjective:     Patient ID: Janice [name], female   DOB: Feb 25, 1966, 47 y.o.   MRN: [id number]  HPI patient is found to have heel pain is still present left over right with inflammation and fluid at the medial band. States it's worse in the morning and after periods of sitting   Review of Systems     Objective:   Physical Exam    neurovascular status intact with pain plantar aspect left heel at the insertion Assessment:     Chronic plantar fasciitis left heel    Plan:     Dispensed orthotics with instructions on usage and dispensed night splint with instructions on sleeping with this for the next 3 weeks reappoint at that time to reevaluate him to see how orthotics and splinting have done for this patient

## 2013-10-27 ENCOUNTER — Ambulatory Visit (INDEPENDENT_AMBULATORY_CARE_PROVIDER_SITE_OTHER): Payer: BC Managed Care – PPO | Admitting: Podiatry

## 2013-10-27 ENCOUNTER — Encounter: Payer: Self-pay | Admitting: Podiatry

## 2013-10-27 VITALS — BP 113/73 | HR 68 | Resp 18

## 2013-10-27 DIAGNOSIS — M722 Plantar fascial fibromatosis: Secondary | ICD-10-CM

## 2013-10-27 MED ORDER — TRIAMCINOLONE ACETONIDE 10 MG/ML IJ SUSP
10.0000 mg | Freq: Once | INTRAMUSCULAR | Status: AC
  Administered 2013-10-27: 10 mg

## 2013-10-27 NOTE — Progress Notes (Signed)
° °  Subjective:    Patient ID: Janice [name], female    DOB: 05-04-1966, 47 y.o.   MRN: [id number]  HPI my heels are still hurting and I have been on my feet a lot and I haven't worn that boot    Review of Systems     Objective:   Physical Exam        Assessment & Plan:

## 2013-10-27 NOTE — Progress Notes (Signed)
Subjective:     Patient ID: Janice [name], female   DOB: [birth year], 47 y.o.   MRN: [id number]  HPI patient presents stating my left heel is still really bothering me and any activities seem to set it off. I like my orthotics but I'm still having a lot of pain   Review of Systems     Objective:   Physical Exam Neurovascular status intact with discomfort of an exquisite nature still noted plantar aspect left heel at the insertional point of the tendon into the calcaneus    Assessment:     Plantar fasciitis left still noted with inflammation and fluid around the medial been    Plan:     Do to intense discomfort I did reinject the plantar fascia 3 mg Kenalog 5 of Xylocaine Marcaine mixture and then dispensed air fracture walker to completely immobilize the plantar foot. Reappoint her recheck again in 4 weeks and may need more aggressive treatment

## 2013-10-29 ENCOUNTER — Ambulatory Visit: Payer: BC Managed Care – PPO | Admitting: Podiatry

## 2013-10-30 ENCOUNTER — Ambulatory Visit: Payer: BC Managed Care – PPO | Admitting: Podiatry

## 2013-11-24 ENCOUNTER — Ambulatory Visit (INDEPENDENT_AMBULATORY_CARE_PROVIDER_SITE_OTHER): Payer: BC Managed Care – PPO | Admitting: Podiatry

## 2013-11-24 ENCOUNTER — Encounter: Payer: Self-pay | Admitting: Podiatry

## 2013-11-24 VITALS — BP 118/70 | HR 65 | Resp 18

## 2013-11-24 DIAGNOSIS — M722 Plantar fascial fibromatosis: Secondary | ICD-10-CM

## 2013-11-24 NOTE — Progress Notes (Signed)
° °  Subjective:    Patient ID: Janice [name], female    DOB: 04-01-1966, 48 y.o.   MRN: [id number]  HPI It is a lot better but I havent exercise so I cant really tell    Review of Systems     Objective:   Physical Exam        Assessment & Plan:

## 2013-11-25 NOTE — Progress Notes (Signed)
Subjective:     Patient ID: Janice [name], female   DOB: Jun 19, 1966, 48 y.o.   MRN: [id number]  HPI patient states my heel seems to be a lot better but have not tried exercising at this time  Review of Systems     Objective:   Physical Exam Neurovascular status intact with minimal discomfort to the left plantar heel upon palpation    Assessment:     Improving plantar fasciitis with immobilization at this time    Plan:     Instructed on continued physical therapy gradual increase in activity levels and continued immobilization for periods of time. If symptoms recur reappoint for more aggressive treatment plan

## 2014-01-28 ENCOUNTER — Encounter: Payer: Self-pay | Admitting: Nurse Practitioner

## 2014-01-28 ENCOUNTER — Ambulatory Visit (INDEPENDENT_AMBULATORY_CARE_PROVIDER_SITE_OTHER): Payer: BC Managed Care – PPO | Admitting: Nurse Practitioner

## 2014-01-28 VITALS — BP 138/86 | HR 92 | Resp 18 | Ht 67.0 in | Wt 183.0 lb

## 2014-01-28 DIAGNOSIS — R300 Dysuria: Secondary | ICD-10-CM

## 2014-01-28 DIAGNOSIS — B3731 Acute candidiasis of vulva and vagina: Secondary | ICD-10-CM

## 2014-01-28 DIAGNOSIS — B373 Candidiasis of vulva and vagina: Secondary | ICD-10-CM

## 2014-01-28 LAB — POCT URINALYSIS DIPSTICK
Bilirubin, UA: NEGATIVE
Blood, UA: NEGATIVE
Glucose, UA: NEGATIVE
Ketones, UA: NEGATIVE
Nitrite, UA: NEGATIVE
Protein, UA: NEGATIVE
Urobilinogen, UA: NEGATIVE
pH, UA: 5

## 2014-01-28 MED ORDER — FLUCONAZOLE 150 MG PO TABS: 150.0000 mg | ORAL_TABLET | Freq: Once | ORAL | Status: DC

## 2014-01-28 NOTE — Progress Notes (Signed)
Subjective:     Patient ID: Konrad Felix, female   DOB: 1966-09-14, 48 y.o.   MRN: [id number]  Vaginal Itching The patient's primary symptoms include pelvic pain and a vaginal discharge. Associated symptoms include dysuria. Pertinent negatives include no abdominal pain, chills, constipation, diarrhea, fever, nausea or vomiting.     This 48 yo Fe complains of vaginal discharge X 2 weeks. Vaginal discharge is clear, no odor but itching and burning sensation.  Treated with OTC Monistat with some help until she finished med's and symptoms  back again.   Also has discomfort in mid abdomen with slight dysuria.    Last UTI a month a go and treated with Cipro 500 mg BID for 5 days. Denies fever, chill, nausea, vomiting.  She is on Tamoxifen for S/P Breast cancer but has no vaginal bleeding.   Review of Systems  Constitutional: Negative for fever, chills and fatigue.  Respiratory: Negative.   Cardiovascular: Negative.   Gastrointestinal: Negative.  Negative for nausea, vomiting, abdominal pain, diarrhea, constipation, blood in stool and abdominal distention.  Genitourinary: Positive for dysuria, vaginal discharge and pelvic pain.  Skin: Negative.   Neurological: Negative.   Psychiatric/Behavioral: Negative.        Objective:   Physical Exam  Constitutional: She is oriented to person, place, and time. She appears well-developed and well-nourished. She appears distressed.  Abdominal: Soft. She exhibits no distension. There is no tenderness. There is no rebound and no guarding.  No flank pain  Genitourinary:  External vulva areas are red and irritated.  There is a thin white vaginal discharge.  No cervicitis or vaginal bleeding.  No pain in adnexa on bimanual.   Wet Prep: PH: 4.0; NSS: negative; KOH: + yeast.  Urine + leuk's  Neurological: She is alert and oriented to person, place, and time.  Psychiatric: She has a normal mood and affect. Her behavior is normal. Judgment and thought content normal.        Assessment:     Yeast vulvovaginitis R/O UTI    Plan:     Treat with Diflucan 150 mg today and repeat in 3 days Will call her with urine culture results

## 2014-01-29 LAB — URINE CULTURE
Colony Count: NO GROWTH
Organism ID, Bacteria: NO GROWTH

## 2014-02-02 ENCOUNTER — Telehealth: Payer: Self-pay | Admitting: Physician Assistant

## 2014-02-02 ENCOUNTER — Telehealth: Payer: Self-pay | Admitting: *Deleted

## 2014-02-02 NOTE — Telephone Encounter (Signed)
Pt called to move her labs on 02/25/14 to 3PM...Marland KitchenTD

## 2014-02-03 NOTE — Progress Notes (Signed)
Encounter reviewed by Dr. Brook Silva.  

## 2014-02-24 ENCOUNTER — Other Ambulatory Visit: Payer: Self-pay | Admitting: *Deleted

## 2014-02-24 DIAGNOSIS — Z853 Personal history of malignant neoplasm of breast: Secondary | ICD-10-CM

## 2014-02-25 ENCOUNTER — Other Ambulatory Visit: Payer: BC Managed Care – PPO

## 2014-02-25 ENCOUNTER — Other Ambulatory Visit: Payer: Self-pay | Admitting: Oncology

## 2014-02-25 ENCOUNTER — Other Ambulatory Visit (HOSPITAL_BASED_OUTPATIENT_CLINIC_OR_DEPARTMENT_OTHER): Payer: BC Managed Care – PPO

## 2014-02-25 DIAGNOSIS — Z853 Personal history of malignant neoplasm of breast: Secondary | ICD-10-CM

## 2014-02-25 DIAGNOSIS — C50419 Malignant neoplasm of upper-outer quadrant of unspecified female breast: Secondary | ICD-10-CM

## 2014-02-25 DIAGNOSIS — Z9889 Other specified postprocedural states: Secondary | ICD-10-CM

## 2014-02-25 LAB — COMPREHENSIVE METABOLIC PANEL (CC13)
ALT: 11 U/L (ref 0–55)
AST: 16 U/L (ref 5–34)
Albumin: 4.1 g/dL (ref 3.5–5.0)
Alkaline Phosphatase: 57 U/L (ref 40–150)
Anion Gap: 7 mEq/L (ref 3–11)
BUN: 10.9 mg/dL (ref 7.0–26.0)
CO2: 26 mEq/L (ref 22–29)
Calcium: 9.6 mg/dL (ref 8.4–10.4)
Chloride: 109 mEq/L (ref 98–109)
Creatinine: 1.2 mg/dL — ABNORMAL HIGH (ref 0.6–1.1)
Glucose: 93 mg/dl (ref 70–140)
Potassium: 3.9 mEq/L (ref 3.5–5.1)
Sodium: 143 mEq/L (ref 136–145)
Total Bilirubin: 0.51 mg/dL (ref 0.20–1.20)
Total Protein: 6.8 g/dL (ref 6.4–8.3)

## 2014-02-25 LAB — CBC WITH DIFFERENTIAL/PLATELET
BASO%: 0.3 % (ref 0.0–2.0)
Basophils Absolute: 0 10*3/uL (ref 0.0–0.1)
EOS%: 2.2 % (ref 0.0–7.0)
Eosinophils Absolute: 0.1 10*3/uL (ref 0.0–0.5)
HCT: 37.5 % (ref 34.8–46.6)
HGB: 12.8 g/dL (ref 11.6–15.9)
LYMPH%: 25.3 % (ref 14.0–49.7)
MCH: 32.3 pg (ref 25.1–34.0)
MCHC: 34.2 g/dL (ref 31.5–36.0)
MCV: 94.3 fL (ref 79.5–101.0)
MONO#: 0.3 10*3/uL (ref 0.1–0.9)
MONO%: 6.2 % (ref 0.0–14.0)
NEUT#: 3.4 10*3/uL (ref 1.5–6.5)
NEUT%: 66 % (ref 38.4–76.8)
Platelets: 183 10*3/uL (ref 145–400)
RBC: 3.98 10*6/uL (ref 3.70–5.45)
RDW: 12.3 % (ref 11.2–14.5)
WBC: 5.1 10*3/uL (ref 3.9–10.3)
lymph#: 1.3 10*3/uL (ref 0.9–3.3)

## 2014-03-04 ENCOUNTER — Ambulatory Visit (HOSPITAL_BASED_OUTPATIENT_CLINIC_OR_DEPARTMENT_OTHER): Payer: BC Managed Care – PPO

## 2014-03-04 ENCOUNTER — Encounter: Payer: Self-pay | Admitting: Physician Assistant

## 2014-03-04 ENCOUNTER — Ambulatory Visit (HOSPITAL_BASED_OUTPATIENT_CLINIC_OR_DEPARTMENT_OTHER): Payer: BC Managed Care – PPO | Admitting: Physician Assistant

## 2014-03-04 ENCOUNTER — Telehealth: Payer: Self-pay | Admitting: Oncology

## 2014-03-04 VITALS — BP 120/80 | HR 68 | Temp 99.3°F | Resp 18 | Ht 67.0 in | Wt 174.6 lb

## 2014-03-04 DIAGNOSIS — R7989 Other specified abnormal findings of blood chemistry: Secondary | ICD-10-CM | POA: Insufficient documentation

## 2014-03-04 DIAGNOSIS — Z853 Personal history of malignant neoplasm of breast: Secondary | ICD-10-CM

## 2014-03-04 DIAGNOSIS — R300 Dysuria: Secondary | ICD-10-CM

## 2014-03-04 LAB — URINALYSIS, MICROSCOPIC - CHCC
Bilirubin (Urine): NEGATIVE
Glucose: NEGATIVE mg/dL
Ketones: NEGATIVE mg/dL
Nitrite: NEGATIVE
Protein: NEGATIVE mg/dL
Specific Gravity, Urine: 1.01 (ref 1.003–1.035)
Urobilinogen, UR: 0.2 mg/dL (ref 0.2–1)
pH: 6 (ref 4.6–8.0)

## 2014-03-04 LAB — BASIC METABOLIC PANEL (CC13)
Anion Gap: 8 mEq/L (ref 3–11)
BUN: 10.7 mg/dL (ref 7.0–26.0)
CO2: 28 mEq/L (ref 22–29)
Calcium: 9.8 mg/dL (ref 8.4–10.4)
Chloride: 108 mEq/L (ref 98–109)
Creatinine: 0.8 mg/dL (ref 0.6–1.1)
Glucose: 101 mg/dl (ref 70–140)
Potassium: 4.2 mEq/L (ref 3.5–5.1)
Sodium: 143 mEq/L (ref 136–145)

## 2014-03-04 NOTE — Telephone Encounter (Signed)
, °

## 2014-03-04 NOTE — Progress Notes (Signed)
ID: Janice [name]   DOB: Nov 06, 1966  MR#: [id number]  YQM#:[id number]   PCP:  Josefa Half, MD GYN:  Josefa Half, MD SUR:  OTHER:  Ila Mcgill, DPM  CHIEF COMPLAINT:  Hx of Right Breast Cancer/On tamoxifen   HISTORY OF PRESENT ILLNESS: She had a screening mammogram in October 2007, which showed some calcifications in the right breast.  Diagnostic mammogram of the right breast October 17, showed these calcifications to be most likely benign, but a six-month mammogram was recommended.  She then had a unilateral right diagnostic mammogram on February 22, 2006, which showed some increase in the number of microcalcifications in the right breast upper-outer quadrant as compared to the prior study.  The same day a biopsy was performed and showed ([id number] and [id number]) an invasive ductal carcinoma, which was strongly ER and PR positive, with an elevated proliferation marker at 31%.  Hercept test negative and negative for HER-2 amplification by FISH.    With this information, the patient was referred to Dr. Annamaria Boots, and MRI of both breasts was obtained April 14. This showed only the solitary area of abnormal enhancement corresponding to the previously biopsied area.  There was no obvious lymphadenopathy.    Accordingly, on March 12, 2006, the patient proceeded to definitive local surgery.  Unfortunately, the sentinel lymph node was positive, so she proceeded to full axillary dissection, as well as of course the right lumpectomy.  The final pathology ([id number]) showed a 1.2 cm infiltrating ductal carcinoma, grade 3, with a total of 2 of 23 lymph nodes involved (an additional lymph node had isolated tumor cells).  Margins were negative and ample.  LVI was present.    Her subsequent history is as detailed below.  INTERVAL HISTORY: Janice [name] returns alone today for a routine one-year followup of her right breast cancer. She continues on tamoxifen with good tolerance. She has no problems with hot flashes and has had no  vaginal changes. Specifically, she denies any vaginal dryness, discharge, or abnormal bleeding. She has not had a menstrual cycle since beginning her chemotherapy, back in 2007.  The interval history is notable for the fact that Janice [name] was recently treated for urinary tract infection and a subsequent yeast infection. Her symptoms have almost resolved, with only some slight residual dysuria today. She denies any hematuria. We did note, however, that her serum creatinine was slightly elevated with her labs last week at 1.2.   ROS: Janice [name] denies any additional illnesses, and has had no fevers or chills. Her energy level is good, and she tries to exercise regularly. She's had no rashes or skin changes and denies any signs of abnormal bruising or bleeding. Her appetite is good. She's had no nausea, emesis, or change in bowel habits. She denies any cough, phlegm production, shortness of breath, peripheral swelling, chest pain, or palpitations. She's had no abnormal headaches, dizziness, or change in vision, and she also denies any unusual myalgias, arthralgias, or bony pain.  A detailed review of systems is otherwise stable and noncontributory.   PAST MEDICAL HISTORY: Past Medical History  Diagnosis Date  . History of breast cancer 03/13/2012  . Crohn's disease   . Genital warts     as a teenager and were frozen and never returned  Significant chiefly for Crohn's disease.  This is followed by Dr. Cristina Gong.  The patient has had prior surgery for Crohn's.  She has also had removal of a left breast cyst remotely by Dr. Zettie Pho.  The Crohn's surgery was  performed by Dr. Excell Seltzer.    FAMILY HISTORY The patient's father is alive at age 44.  The patient's mother is alive at age 75.  The patient has a sister, who had cervical cancer in her 76s, but is now fine.  There is no history of breast or ovarian cancer in the family.  GYNECOLOGIC HISTORY:  (Updated 03/04/2014) She is GX P2, first pregnancy age 89.  She  nursed a little bit with both pregnancies.  Last menstrual period was April 2007.  SOCIAL HISTORY:  (updated 03/04/2014) The patient is a homemaker.  Her husband, Sherren Mocha, is in the building trade.    She has a daughter age 69, who is attending [employer] for criminal justice, and a son age 19.  The patient attends [employer].     ADVANCED DIRECTIVES:   HEALTH MAINTENANCE: (Updated 03/04/2014) History  Substance Use Topics  . Smoking status: Never Smoker   . Smokeless tobacco: Never Used  . Alcohol Use: No     Colonoscopy: Buccini, February 2011   PAP:  September 2014/Silva  Bone density:  September 2014   Lipid panel:  April 2014    Allergies  Allergen Reactions  . Percocet [Oxycodone-Acetaminophen] Nausea And Vomiting    Current Outpatient Prescriptions  Medication Sig Dispense Refill  . calcium carbonate (OS-CAL) 600 MG TABS Take 600 mg by mouth 2 (two) times daily with a meal.      . cholecalciferol (VITAMIN D) 400 UNITS TABS Take 5,000 Units by mouth daily.      Marland Kitchen glucosamine-chondroitin 500-400 MG tablet Take 1 tablet by mouth daily.      . Multiple Vitamin (MULTIVITAMIN) tablet Take 1 tablet by mouth daily.      . tamoxifen (NOLVADEX) 20 MG tablet Take 1 tablet (20 mg total) by mouth daily.  90 tablet  12   No current facility-administered medications for this visit.    OBJECTIVE: Middle-aged white woman who appears well and is in no acute distress Filed Vitals:   03/04/14 1145  BP: 120/80  Pulse: 68  Temp: 99.3 F (37.4 C)  Resp: 18     Body mass index is 27.34 kg/(m^2).    ECOG FS: 0 Filed Weights   03/04/14 1145  Weight: 174 lb 9.6 oz (79.198 kg)   Physical Exam: HEENT:  Sclerae anicteric.  Oropharynx clear and moist. Neck supple, trachea midline. No thyromegaly.  NODES:  No cervical or supraclavicular lymphadenopathy palpated.  BREAST EXAM:  Right breast is status post lumpectomy with well-healed incision. No suspicious nodularities or  skin changes, and no evidence of local recurrence. Left breast is unremarkable. Axillae are benign bilaterally for palpable lymphadenopathy. LUNGS:  Clear to auscultation bilaterally.  No wheezes or rhonchi HEART:  Regular rate and rhythm. No murmur  ABDOMEN:  Soft, nontender. No organomegaly or masses palpated. Positive bowel sounds.  MSK:  No focal spinal tenderness to palpation. Good range of motion bilaterally in the upper tremor these. EXTREMITIES:  No peripheral edema.  No lymphedema in the right upper extremity. SKIN:  Benign with no visible rashes. No excessive ecchymoses. No petechiae. No pallor. NEURO:  Nonfocal. Well oriented.  Appropriate affect.    LAB RESULTS:   Lab Results  Component Value Date   WBC 5.1 02/25/2014   NEUTROABS 3.4 02/25/2014   HGB 12.8 02/25/2014   HCT 37.5 02/25/2014   MCV 94.3 02/25/2014   PLT 183 02/25/2014      Chemistry      Component  Value Date/Time   NA 143 03/04/2014 1257   NA 141 03/28/2012 1315   K 4.2 03/04/2014 1257   K 3.9 03/28/2012 1315   CL 106 02/18/2013 0829   CL 105 03/28/2012 1315   CO2 28 03/04/2014 1257   CO2 29 03/28/2012 1315   BUN 10.7 03/04/2014 1257   BUN 11 03/28/2012 1315   CREATININE 0.8 03/04/2014 1257   CREATININE 0.87 03/28/2012 1315      Component Value Date/Time   CALCIUM 9.8 03/04/2014 1257   CALCIUM 9.5 03/28/2012 1315   ALKPHOS 57 02/25/2014 1445   ALKPHOS 47 03/28/2012 1315   AST 16 02/25/2014 1445   AST 15 03/28/2012 1315   ALT 11 02/25/2014 1445   ALT 12 03/28/2012 1315   BILITOT 0.51 02/25/2014 1445   BILITOT 0.6 03/28/2012 1315      STUDIES: Most recent bilateral mammogram on 07/29/2013 was unremarkable.   ASSESSMENT: 48 y.o.  [place] woman   (1)  status post right lumpectomy and axillary lymph node dissection April of 2007 for a T1c N1, stage IIB invasive ductal carcinoma, grade 2, estrogen and progesterone receptor positive, HER-2 negative  with an MIB-1 of 31%   (2) treated adjuvantly with Cytoxan and Adriamycin x4 in dose  dense fashion followed by weekly paclitaxel x12   (3) s/p adjuvant radiation completed in January of 2008   (4) on tamoxifen as of January of 2008, the plan being to go for total of 10 years.   PLAN:  With regards to her breast cancer, Janice [name] appears to be doing extremely well, with no clinical evidence of disease recurrence. She'll continue on the tamoxifen, and I'm making no changes to her current regimen. The plan will be to continue tamoxifen until January 2018, and we will continue seeing her on an annual basis for labs and physical exam. She'll be due for her next bilateral mammogram in September of this year.   We did recheck her basic metabolic panel today to reevaluate her serum creatinine which fortunately has returned to normal at 0.8. The urinalysis showed only a trace of blood and a trace of leukocyte esterase, and we have sent a urine culture for further evaluation. If that doesn't show evidence of a urinary tract infection, it may be that the infection simply has not cleared and we will consider another course of antibiotics or a referral back to Dr. Quincy Simmonds.  Janice [name] voices her understanding and agreement with the above plan. She will call with any changes or problems prior to her next annual visit in April 2016.  Kurk Corniel Milda Smart PA-C   03/04/2014

## 2014-03-05 ENCOUNTER — Telehealth: Payer: Self-pay | Admitting: *Deleted

## 2014-03-05 LAB — URINE CULTURE

## 2014-03-05 NOTE — Telephone Encounter (Signed)
Called pt to inform her of urine culture results. Communicated to pt she had no evidence of a UTI. Pt verbalized understanding. No further concerns. Message to be forwarded to [name], PA-C.

## 2014-04-08 ENCOUNTER — Other Ambulatory Visit: Payer: Self-pay | Admitting: Oncology

## 2014-04-08 DIAGNOSIS — Z853 Personal history of malignant neoplasm of breast: Secondary | ICD-10-CM

## 2014-06-23 ENCOUNTER — Other Ambulatory Visit: Payer: Self-pay | Admitting: Oncology

## 2014-06-23 DIAGNOSIS — Z9889 Other specified postprocedural states: Secondary | ICD-10-CM

## 2014-06-23 DIAGNOSIS — Z1231 Encounter for screening mammogram for malignant neoplasm of breast: Secondary | ICD-10-CM

## 2014-07-15 ENCOUNTER — Encounter: Payer: Self-pay | Admitting: Obstetrics and Gynecology

## 2014-08-03 ENCOUNTER — Ambulatory Visit: Payer: BC Managed Care – PPO | Admitting: Obstetrics and Gynecology

## 2014-08-04 ENCOUNTER — Ambulatory Visit
Admission: RE | Admit: 2014-08-04 | Discharge: 2014-08-04 | Disposition: A | Discharge status: Home / Self Care | Payer: BC Managed Care – PPO | Source: Ambulatory Visit | Attending: Oncology | Admitting: Oncology

## 2014-08-04 DIAGNOSIS — Z9889 Other specified postprocedural states: Secondary | ICD-10-CM

## 2014-08-04 DIAGNOSIS — Z1231 Encounter for screening mammogram for malignant neoplasm of breast: Secondary | ICD-10-CM

## 2014-08-12 ENCOUNTER — Encounter: Payer: Self-pay | Admitting: Obstetrics and Gynecology

## 2014-08-12 ENCOUNTER — Ambulatory Visit (INDEPENDENT_AMBULATORY_CARE_PROVIDER_SITE_OTHER): Payer: BC Managed Care – PPO | Admitting: Obstetrics and Gynecology

## 2014-08-12 VITALS — BP 100/70 | HR 60 | Resp 14 | Ht 67.0 in | Wt 174.6 lb

## 2014-08-12 DIAGNOSIS — Z0000 Encounter for general adult medical examination without abnormal findings: Secondary | ICD-10-CM

## 2014-08-12 DIAGNOSIS — Z01419 Encounter for gynecological examination (general) (routine) without abnormal findings: Secondary | ICD-10-CM

## 2014-08-12 DIAGNOSIS — Z23 Encounter for immunization: Secondary | ICD-10-CM

## 2014-08-12 LAB — POCT URINALYSIS DIPSTICK
Bilirubin, UA: NEGATIVE
Blood, UA: NEGATIVE
Glucose, UA: NEGATIVE
Ketones, UA: NEGATIVE
Leukocytes, UA: NEGATIVE
Nitrite, UA: NEGATIVE
Protein, UA: NEGATIVE
Urobilinogen, UA: NEGATIVE
pH, UA: 5

## 2014-08-12 LAB — LIPID PANEL
Cholesterol: 143 mg/dL (ref 0–200)
HDL: 65 mg/dL (ref >39)
LDL Cholesterol: 44 mg/dL (ref 0–99)
Total CHOL/HDL Ratio: 2.2 Ratio
Triglycerides: 168 mg/dL — ABNORMAL HIGH (ref <150)
VLDL: 34 mg/dL (ref 0–40)

## 2014-08-12 NOTE — Progress Notes (Signed)
Patient ID: Janice [name], female   DOB: Mar 12, 1966, 48 y.o.   MRN: [id number] GYNECOLOGY VISIT  PCP:   Referring provider:   HPI: 48 y.o.   Married  Caucasian  female   G2P2002 with Patient's last menstrual period was 03/20/2006.   here for  AEX.  Patient is on Tamoxifen.  No  Spotting or bleeding vaginally.   Hgb:    Dr. Viann Shove on 02-25-14 Urine:  Neg  GYNECOLOGIC HISTORY: Patient's last menstrual period was 03/20/2006. Sexually active: yes  Partner preference: female Contraception:  vasectomy Menopausal hormone therapy:  DES exposure: no   Blood transfusions:  no  Sexually transmitted diseases: no   GYN procedures and prior surgeries:  Hysteroscopy polyp resection, Rt. Breast lumpectomy for breast CA--1 lymph node pos., endometrial biopsy 2011--polyp benign, endometrial biopsy benign 2012 Last mammogram:   08-04-14 dense breasts/[facility]l:[facility]              Last pap and high risk HPV testing:  07-29-13 wnl:neg HR HPV  History of abnormal pap smear:  no   OB History   Grav Para Term Preterm Abortions TAB SAB Ect Mult Living   2 2 2       2        LIFESTYLE: Exercise: no             OTHER HEALTH MAINTENANCE: Tetanus/TDap:  ?greater than 10 years HPV:                    n/a Influenza:             Most years   Bone density:      07-17-11 osteopenia-Tscore of spine-08, T score of left hip - 1.3. Colonoscopy:      01-14-10 wnl with Dr. Ronald Lobo.  Next colonoscopy due 12/2014.  Cholesterol check: 2014 wnl   Family History  Problem Relation Age of Onset  . Heart attack Father   . Hypertension Maternal Grandmother     Patient Active Problem List   Diagnosis Date Noted  . Dysuria 03/04/2014  . Elevated serum creatinine 03/04/2014  . History of breast cancer 03/13/2012   Past Medical History  Diagnosis Date  . History of breast cancer 03/13/2012  . Crohn's disease   . Genital warts     as a teenager and were frozen and never returned    Past  Surgical History  Procedure Laterality Date  . Colon resection  2004    d/t crohns dz--Dr. Excell Seltzer  . Cholecystectomy    . Hysteroscopy  12-09-2009    polyp resection and D &C --Dr. Joan Flores  . Breast surgery  2007    Rt.lumpectomy--Br. CA, lymph node dissection  1 Pos., Chemo, RX, Tamoxifen  . Endometrial biopsy      11/2009 benign endometrial polyp; 06/2011 wnl    ALLERGIES: Percocet  Current Outpatient Prescriptions  Medication Sig Dispense Refill  . calcium carbonate (OS-CAL) 600 MG TABS Take 600 mg by mouth 2 (two) times daily with a meal.      . cholecalciferol (VITAMIN D) 400 UNITS TABS Take 5,000 Units by mouth daily.      Marland Kitchen glucosamine-chondroitin 500-400 MG tablet Take 1 tablet by mouth daily.      . Multiple Vitamin (MULTIVITAMIN) tablet Take 1 tablet by mouth daily.      . tamoxifen (NOLVADEX) 20 MG tablet TAKE 1 TABLET BY MOUTH DAILY  90 tablet  4   No current facility-administered medications for  this visit.     ROS:  Pertinent items are noted in HPI.  History   Social History  . Marital Status: Married    Spouse Name: [name]    Number of Children: [name]  . Years of Education: [name]   Occupational History  . Not on file.   Social History Main Topics  . Smoking status: Never Smoker   . Smokeless tobacco: Never Used  . Alcohol Use: No  . Drug Use: No  . Sexual Activity: Yes    Partners: Male    Birth Control/ Protection: Other-see comments     Comment: Vas   Other Topics Concern  . Not on file   Social History Narrative  . No narrative on file    PHYSICAL EXAMINATION:    BP 100/70  Pulse 60  Resp 14  Ht 5' 7"  (1.702 m)  Wt 174 lb 9.6 oz (79.198 kg)  BMI 27.34 kg/m2  LMP 03/20/2006   Wt Readings from Last 3 Encounters:  08/12/14 174 lb 9.6 oz (79.198 kg)  03/04/14 174 lb 9.6 oz (79.198 kg)  01/28/14 183 lb (83.008 kg)     Ht Readings from Last 3 Encounters:  08/12/14 5' 7"  (1.702 m)  03/04/14 5' 7"  (1.702 m)  01/28/14 5' 7"  (1.702 m)     General appearance: alert, cooperative and appears stated age Head: Normocephalic, without obvious abnormality, atraumatic Neck: no adenopathy, supple, symmetrical, trachea midline and thyroid not enlarged, symmetric, no tenderness/mass/nodules Lungs: clear to auscultation bilaterally Breasts: right outer quadrant and right axillary scars, No nipple retraction or dimpling, No nipple discharge or bleeding, No axillary or supraclavicular adenopathy, Normal to palpation without dominant masses Heart: regular rate and rhythm Abdomen: vertical midline incision, soft, non-tender; no masses,  no organomegaly Extremities: extremities normal, atraumatic, no cyanosis or edema Skin: Skin color, texture, turgor normal. No rashes or lesions Lymph nodes: Cervical, supraclavicular, and axillary nodes normal. No abnormal inguinal nodes palpated Neurologic: Grossly normal  Pelvic: External genitalia:  no lesions              Urethra:  normal appearing urethra with no masses, tenderness or lesions              Bartholins and Skenes: normal                 Vagina: normal appearing vagina with normal color and discharge, no lesions              Cervix: normal appearance              Pap and high risk HPV testing done: No.        Bimanual Exam:  Uterus:  uterus is normal size, shape, consistency and nontender                                      Adnexa: normal adnexa in size, nontender and no masses                                      Rectovaginal:  Yes.                                        Confirms above.  Anus:  normal sphincter tone, no lesions  ASSESSMENT  Normal gynecologic exam. History of right breast cancer.  On tamoxifen.  Osteopenia.  Declines bone density.   PLAN  Mammogram recommended yearly. Pap smear and high risk HPV testing as above. Counseled on self breast exam, Calcium and vitamin D intake, exercise. See lab orders: Yes.   TDap today.   Return annually or prn   An After Visit Summary was printed and given to the patient.

## 2014-08-12 NOTE — Patient Instructions (Signed)

## 2014-09-08 ENCOUNTER — Other Ambulatory Visit: Payer: Self-pay

## 2014-09-21 ENCOUNTER — Encounter: Payer: Self-pay | Admitting: Obstetrics and Gynecology

## 2015-03-01 ENCOUNTER — Other Ambulatory Visit (HOSPITAL_BASED_OUTPATIENT_CLINIC_OR_DEPARTMENT_OTHER): Payer: BLUE CROSS/BLUE SHIELD

## 2015-03-01 DIAGNOSIS — Z853 Personal history of malignant neoplasm of breast: Secondary | ICD-10-CM

## 2015-03-01 LAB — CBC WITH DIFFERENTIAL/PLATELET
BASO%: 0.4 % (ref 0.0–2.0)
Basophils Absolute: 0 10*3/uL (ref 0.0–0.1)
EOS%: 1.5 % (ref 0.0–7.0)
Eosinophils Absolute: 0.1 10*3/uL (ref 0.0–0.5)
HCT: 39.4 % (ref 34.8–46.6)
HGB: 13.6 g/dL (ref 11.6–15.9)
LYMPH%: 28 % (ref 14.0–49.7)
MCH: 32.2 pg (ref 25.1–34.0)
MCHC: 34.5 g/dL (ref 31.5–36.0)
MCV: 93.4 fL (ref 79.5–101.0)
MONO#: 0.3 10*3/uL (ref 0.1–0.9)
MONO%: 6.4 % (ref 0.0–14.0)
NEUT#: 3.3 10*3/uL (ref 1.5–6.5)
NEUT%: 63.7 % (ref 38.4–76.8)
Platelets: 163 10*3/uL (ref 145–400)
RBC: 4.22 10*6/uL (ref 3.70–5.45)
RDW: 12.6 % (ref 11.2–14.5)
WBC: 5.2 10*3/uL (ref 3.9–10.3)
lymph#: 1.5 10*3/uL (ref 0.9–3.3)
nRBC: 0 % (ref 0–0)

## 2015-03-01 LAB — COMPREHENSIVE METABOLIC PANEL (CC13)
ALT: 16 U/L (ref 0–55)
AST: 20 U/L (ref 5–34)
Albumin: 4.2 g/dL (ref 3.5–5.0)
Alkaline Phosphatase: 61 U/L (ref 40–150)
Anion Gap: 10 mEq/L (ref 3–11)
BUN: 11.1 mg/dL (ref 7.0–26.0)
CO2: 27 mEq/L (ref 22–29)
Calcium: 9.5 mg/dL (ref 8.4–10.4)
Chloride: 107 mEq/L (ref 98–109)
Creatinine: 0.8 mg/dL (ref 0.6–1.1)
EGFR: >90 mL/min/{1.73_m2} (ref >90)
Glucose: 91 mg/dl (ref 70–140)
Potassium: 4.5 mEq/L (ref 3.5–5.1)
Sodium: 144 mEq/L (ref 136–145)
Total Bilirubin: 0.67 mg/dL (ref 0.20–1.20)
Total Protein: 6.8 g/dL (ref 6.4–8.3)

## 2015-03-07 NOTE — Progress Notes (Signed)
ID: Janice [name]   DOB: [birth year]  MR#: [id number]  WUJ#:[id number]   PCP:  Janice Half, MD GYN:  Janice Half, MD SUR:  OTHER:  Janice [name], DPM  CHIEF COMPLAINT:  Estrogen receptor positive breast cancer  CURRENT TREATMENT: Tamoxifen   HISTORY OF PRESENT ILLNESS: From the original intake note:  She had a screening mammogram in October 2007, which showed some calcifications in the right breast.  Diagnostic mammogram of the right breast October 17, showed these calcifications to be most likely benign, but a six-month mammogram was recommended.  She then had a unilateral right diagnostic mammogram on February 22, 2006, which showed some increase in the number of microcalcifications in the right breast upper-outer quadrant as compared to the prior study.  The same day a biopsy was performed and showed ([id number] and [id number]) an invasive ductal carcinoma, which was strongly ER and PR positive, with an elevated proliferation marker at 31%.  Hercept test negative and negative for HER-2 amplification by FISH.    With this information, the patient was referred to Dr. Annamaria [name], and MRI of both breasts was obtained April 14. This showed only the solitary area of abnormal enhancement corresponding to the previously biopsied area.  There was no obvious lymphadenopathy.    Accordingly, on March 12, 2006, the patient proceeded to definitive local surgery.  Unfortunately, the sentinel lymph node was positive, so she proceeded to full axillary dissection, as well as of course the right lumpectomy.  The final pathology ([id number]) showed a 1.2 cm infiltrating ductal carcinoma, grade 3, with a total of 2 of 23 lymph nodes involved (an additional lymph node had isolated tumor cells).  Margins were negative and ample.  LVI was present.    Her subsequent history is as detailed below.  INTERVAL HISTORY: Janice [name] returns today for follow-up of her right-sided breast cancer. The interval history is very stable. 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 She has been exercising on a daily basis since January. As far as tamoxifen is concerned she doesn't have problems with hot flashes, vaginal dryness or wetness, or other side effects that she is aware of.  ROS: She had a tick bite under her right breast and she has a rash there now that she would like me to look at. A detailed review of systems today was otherwise entirely negative.  PAST MEDICAL HISTORY: Past Medical History  Diagnosis Date  . History of breast cancer 03/13/2012  . Crohn's disease   . Genital warts     as a teenager and were frozen and never returned  Significant chiefly for Crohn's disease.  This is followed by Dr. Cristina [name].  The patient has had prior surgery for Crohn's.  She has also had removal of a left breast cyst remotely by Janice [name].  The Crohn's surgery was performed by Dr. Excell [name].    FAMILY HISTORY The patient's father is alive at age 50.  The patient's mother is alive at age 70.  The patient has a sister, who had cervical cancer in her 13s, but is now fine.  There is no history of breast or ovarian cancer in the family.  GYNECOLOGIC HISTORY:  (Updated 03/04/2014) She is GX P2, first pregnancy age 57.  She nursed a little bit with both pregnancies.  Last menstrual period was April 2007.  SOCIAL HISTORY:  (updated 03/04/2014) The patient is a homemaker.  Her  husband, Janice [name], is in the building trade.    She has a daughter age 31, who is attending [employer] for criminal justice, and a son age 33 currently in [place] working in Scientist, research (life sciences) estate.  The patient attends [employer].     ADVANCED DIRECTIVES: not in place  HEALTH MAINTENANCE: (Updated 03/04/2014) History  Substance Use Topics  . Smoking status: Never Smoker   . Smokeless tobacco: Never Used  . Alcohol Use: No     Colonoscopy: Janice [name], February 2011    PAP:  September 2014/Janice [name]  Bone density:  September 2014   Lipid panel:  April 2014    Allergies  Allergen Reactions  . Percocet [Oxycodone-Acetaminophen] Nausea And Vomiting    Current Outpatient Prescriptions  Medication Sig Dispense Refill  . cholecalciferol (VITAMIN D) 400 UNITS TABS Take 5,000 Units by mouth daily.    Marland Kitchen glucosamine-chondroitin 500-400 MG tablet Take 1 tablet by mouth daily.    . Multiple Vitamin (MULTIVITAMIN) tablet Take 1 tablet by mouth daily.    . tamoxifen (NOLVADEX) 20 MG tablet TAKE 1 TABLET BY MOUTH DAILY 90 tablet 4   No current facility-administered medications for this visit.    OBJECTIVE: Middle-aged white woman who appears stated age 49 Vitals:   03/08/15 1113  BP: 136/70  Pulse: 67  Temp: 98.5 F (36.9 C)  Resp: 18     Body mass index is 27.29 kg/(m^2).    ECOG FS: 0 Filed Weights   03/08/15 1113  Weight: 174 lb 4.8 oz (79.062 kg)   Sclerae unicteric, pupils round and equal Oropharynx clear and moist-- no thrush or other lesions No cervical or supraclavicular adenopathy Lungs no rales or rhonchi Heart regular rate and rhythm Abd soft, nontender, positive bowel sounds MSK no focal spinal tenderness, no upper extremity lymphedema Neuro: nonfocal, well oriented, appropriate affect Breasts: The right breast is status post lumpectomy and radiation. There is no evidence of local recurrence. Under the right breast there is a rash, which is erythematous, palpable, and irregular. This is pictured below. The right axilla is benign. The left breast is unremarkable.  Marland Kitchen      LAB RESULTS:   Lab Results  Component Value Date   WBC 5.2 03/01/2015   NEUTROABS 3.3 03/01/2015   HGB 13.6 03/01/2015   HCT 39.4 03/01/2015   MCV 93.4 03/01/2015   PLT 163 03/01/2015      Chemistry      Component Value Date/Time   NA 144 03/01/2015 1118   NA 141 03/28/2012 1315   K 4.5 03/01/2015 1118   K 3.9 03/28/2012 1315   CL 106 02/18/2013  0829   CL 105 03/28/2012 1315   CO2 27 03/01/2015 1118   CO2 29 03/28/2012 1315   BUN 11.1 03/01/2015 1118   BUN 11 03/28/2012 1315   CREATININE 0.8 03/01/2015 1118   CREATININE 0.87 03/28/2012 1315      Component Value Date/Time   CALCIUM 9.5 03/01/2015 1118   CALCIUM 9.5 03/28/2012 1315   ALKPHOS 61 03/01/2015 1118   ALKPHOS 47 03/28/2012 1315   AST 20 03/01/2015 1118   AST 15 03/28/2012 1315   ALT 16 03/01/2015 1118   ALT 12 03/28/2012 1315   BILITOT 0.67 03/01/2015 1118   BILITOT 0.6 03/28/2012 1315      STUDIES: MCLINICAL DATA: Screening.  EXAM: DIGITAL SCREENING BILATERAL MAMMOGRAM WITH 3D TOMO WITH CAD  DIGITAL BREAST TOMOSYNTHESIS  Digital breast tomosynthesis images are acquired in two projections. These images  are reviewed in combination with the digital mammogram, confirming the findings below.  COMPARISON: Previous exam(s).  ACR Breast Density Category b: There are scattered areas of fibroglandular density.  FINDINGS: There are no findings suspicious for malignancy. Images were processed with CAD.  IMPRESSION: No mammographic evidence of malignancy. A result letter of this screening mammogram will be mailed directly to the patient.  RECOMMENDATION: Screening mammogram in one year. (Code:SM-B-01Y)  BI-RADS CATEGORY 1: Negative.   Electronically Signed  By: Janice [name] M.D.  On: 08/04/2014 13:48   ASSESSMENT: 49 y.o.  BRCA negative [place] woman   (1)  status post right lumpectomy and axillary lymph node dissection April of 2007 for a T1c N1, stage IIB invasive ductal carcinoma, grade 2, estrogen and progesterone receptor positive, HER-2 negative  with an MIB-1 of 31%   (2) treated adjuvantly with Cytoxan and Adriamycin x4 in dose dense fashion followed by weekly paclitaxel x12   (3) s/p adjuvant radiation completed in January of 2008   (4) on tamoxifen as of January of 2008, the plan being to go for total of 10 years.    (5) discussed the possibility of additional genetics testing April 2016; declined  PLAN:  Madinah is doing terrific as far as her breast cancer is concerned, with no evidence of disease recurrence. The rash that she has under her right breast may or may not be related to the tick bite that she recently had. It has not responded to steroids. I went ahead and gave her a prescription for ketoconazole 2% cream to use twice daily for the next few days and see that clears it. I do not think that she needs doxycycline prophylactically for this.  We discussed the fact that when we tested her for the BRCA1 and 2 genes 9 years ago we were not doing the full panel we are doing now. We discussed doing additional testing. I am not sure that it would make any clinical difference in her situation  At this point she declines.  She will see [place] again in one year. After that she will be set up to see me again November 2017 and that will be her graduation visit.  Lillyanne knows to call for any problems that may develop before her next visit here.  Chauncey Cruel, MD   03/08/2015

## 2015-03-08 ENCOUNTER — Telehealth: Payer: Self-pay | Admitting: Oncology

## 2015-03-08 ENCOUNTER — Ambulatory Visit (HOSPITAL_BASED_OUTPATIENT_CLINIC_OR_DEPARTMENT_OTHER): Payer: BLUE CROSS/BLUE SHIELD | Admitting: Oncology

## 2015-03-08 VITALS — BP 136/70 | HR 67 | Temp 98.5°F | Resp 18 | Ht 67.0 in | Wt 174.3 lb

## 2015-03-08 DIAGNOSIS — Z853 Personal history of malignant neoplasm of breast: Secondary | ICD-10-CM | POA: Diagnosis not present

## 2015-03-08 DIAGNOSIS — Z79811 Long term (current) use of aromatase inhibitors: Secondary | ICD-10-CM

## 2015-03-08 DIAGNOSIS — R21 Rash and other nonspecific skin eruption: Secondary | ICD-10-CM

## 2015-03-08 DIAGNOSIS — C50911 Malignant neoplasm of unspecified site of right female breast: Secondary | ICD-10-CM | POA: Insufficient documentation

## 2015-03-08 MED ORDER — TAMOXIFEN CITRATE 20 MG PO TABS: 20.0000 mg | ORAL_TABLET | Freq: Every day | ORAL | Status: DC

## 2015-03-08 MED ORDER — KETOCONAZOLE 2 % EX CREA: 1.0000 "application " | TOPICAL_CREAM | Freq: Every day | CUTANEOUS | Status: DC

## 2015-03-08 NOTE — Telephone Encounter (Signed)
per pof to sch pt appt-gave pt copy of sch °

## 2015-04-26 ENCOUNTER — Other Ambulatory Visit: Payer: Self-pay

## 2015-04-26 DIAGNOSIS — Z1231 Encounter for screening mammogram for malignant neoplasm of breast: Secondary | ICD-10-CM

## 2015-06-01 ENCOUNTER — Encounter: Payer: Self-pay | Admitting: Genetic Counselor

## 2015-06-07 ENCOUNTER — Other Ambulatory Visit: Payer: Self-pay | Admitting: Gastroenterology

## 2015-06-18 ENCOUNTER — Telehealth: Payer: Self-pay | Admitting: Genetic Counselor

## 2015-06-18 NOTE — Telephone Encounter (Signed)
Patient called to ask more about the letter she received.  Discussed that genetic testing has improved over the years so we are now recalling patients seen prior to 2013 to see if they would be interested in updated testing.  Discussed genetic counseling appointment, blood draw and sending to lab.  Lab will do benefit preverification and call if her OOP is higher than $100.  She is not interested in pursuing unless everything is free.  I discussed I could not guarantee that everything would be free, as I did not know what the cost of GC would be, but that she would know what her OOP for genetic testing would be.  She has declined additional testing at this time.

## 2015-07-12 ENCOUNTER — Other Ambulatory Visit: Payer: Self-pay | Admitting: Oncology

## 2015-08-09 ENCOUNTER — Ambulatory Visit
Admission: RE | Admit: 2015-08-09 | Discharge: 2015-08-09 | Disposition: A | Discharge status: Home / Self Care | Payer: BLUE CROSS/BLUE SHIELD | Source: Ambulatory Visit

## 2015-08-09 DIAGNOSIS — Z1231 Encounter for screening mammogram for malignant neoplasm of breast: Secondary | ICD-10-CM

## 2015-08-25 ENCOUNTER — Ambulatory Visit (INDEPENDENT_AMBULATORY_CARE_PROVIDER_SITE_OTHER): Payer: BLUE CROSS/BLUE SHIELD | Admitting: Obstetrics and Gynecology

## 2015-08-25 ENCOUNTER — Encounter: Payer: Self-pay | Admitting: Obstetrics and Gynecology

## 2015-08-25 VITALS — BP 130/88 | HR 70 | Resp 14 | Ht 67.5 in | Wt 178.6 lb

## 2015-08-25 DIAGNOSIS — Z01419 Encounter for gynecological examination (general) (routine) without abnormal findings: Secondary | ICD-10-CM | POA: Diagnosis not present

## 2015-08-25 DIAGNOSIS — E781 Pure hyperglyceridemia: Secondary | ICD-10-CM | POA: Diagnosis not present

## 2015-08-25 NOTE — Addendum Note (Signed)
Addended by: Yisroel Ramming, Dietrich Pates E on: 08/25/2015 02:37 PM   Modules accepted: Orders

## 2015-08-25 NOTE — Progress Notes (Signed)
Patient ID: Janice [name], female   DOB: [birth year], 49 y.o.   MRN: [id number] 49 y.o. G17P2002 Married Caucasian female here for annual exam.   On Tamoxifen 20 mg.  Occasional night sweats.  No vaginal bleeding or spotting.   Occasional groin twinges, feels deep inside.  Working out.   PCP:  [place]   Patient's last menstrual period was 03/20/2006.          Sexually active: Yes.  female partner  The current method of family planning is vasectomy.    Exercising: Yes.    walking and some weights. Smoker:  no  Health Maintenance: Pap:  07-28-13 Neg:Neg HR HPV History of abnormal Pap:  No.  Uncertain about her history.  MMG:  08-09-15 Hx Rt. Lumpectomy for Breast Ca--1 lymph node Pos.:3D Density Cat.B/Neg/BiRads 1:[facility]. Colonoscopy: 2016 1 small polyp with Dr. Cristina Gong. Next due 2019/2020. BMD:  Osteopenia in 2012.  TDaP:  08-12-14 Screening Labs:  Hb today: done with Oncology, Urine today: not done   reports that she has never smoked. She has never used smokeless tobacco. She reports that she does not drink alcohol or use illicit drugs.  Past Medical History  Diagnosis Date  . History of breast cancer 03/13/2012  . Crohn's disease ([place])   . Genital warts     as a teenager and were frozen and never returned    Past Surgical History  Procedure Laterality Date  . Colon resection  2004    d/t crohns dz--Dr. Excell Seltzer  . Cholecystectomy    . Hysteroscopy  12-09-2009    polyp resection and D &C --Dr. Joan Flores  . Breast surgery  2007    Rt.lumpectomy--Br. CA, lymph node dissection  1 Pos., Chemo, RX, Tamoxifen  . Endometrial biopsy      11/2009 benign endometrial polyp; 06/2011 wnl    Current Outpatient Prescriptions  Medication Sig Dispense Refill  . cholecalciferol (VITAMIN D) 400 UNITS TABS Take 5,000 Units by mouth daily.    Marland Kitchen glucosamine-chondroitin 500-400 MG tablet Take 1 tablet by mouth daily.    . Multiple Vitamin (MULTIVITAMIN) tablet Take 1  tablet by mouth daily.    . tamoxifen (NOLVADEX) 20 MG tablet TAKE 1 TABLET BY MOUTH DAILY 90 tablet 4   No current facility-administered medications for this visit.    Family History  Problem Relation Age of Onset  . Heart attack Father   . Hypertension Maternal Grandmother     ROS:  Pertinent items are noted in HPI.  Otherwise, a comprehensive ROS was negative.  Exam:   BP 130/88 mmHg  Pulse 70  Resp 14  Ht 5' 7.5" (1.715 m)  Wt 178 lb 9.6 oz (81.012 kg)  BMI 27.54 kg/m2  LMP 03/20/2006    General appearance: alert, cooperative and appears stated age Head: Normocephalic, without obvious abnormality, atraumatic Neck: no adenopathy, supple, symmetrical, trachea midline and thyroid normal to inspection and palpation Lungs: clear to auscultation bilaterally Breasts: normal appearance, no masses or tenderness, Inspection negative, No nipple retraction or dimpling, No nipple discharge or bleeding, No axillary or supraclavicular adenopathy, scar on right lateral breast.  Heart: regular rate and rhythm Abdomen: soft, non-tender; bowel sounds normal; no masses,  no organomegaly Extremities: extremities normal, atraumatic, no cyanosis or edema Skin: Skin color, texture, turgor normal. No rashes or lesions Lymph nodes: Cervical, supraclavicular, and axillary nodes normal. No abnormal inguinal nodes palpated Neurologic: Grossly normal  Pelvic: External genitalia:  no lesions  Urethra:  normal appearing urethra with no masses, tenderness or lesions              Bartholins and Skenes: normal                 Vagina: normal appearing vagina with normal color and discharge, no lesions              Cervix: no lesions              Pap taken: Yes.   Bimanual Exam:  Uterus:  normal size, contour, position, consistency, mobility, non-tender              Adnexa: normal adnexa and no mass, fullness, tenderness              Rectovaginal: Yes.  .  Confirms.              Anus:  normal  sphincter tone, no lesions  Chaperone was present for exam.  Assessment:   Well woman visit with normal exam. Mildly elevated triglycerides.  Hx right breast cancer.  On Tamoxifen.  Bilateral groin pain - not consistent.  No hernias.  Mild osteopenia.   Plan: Yearly mammogram recommended after age 56.  Recommended self breast exam.  Pap and HR HPV as above. Discussed Calcium, Vitamin D, regular exercise program including cardiovascular and weight bearing exercise. Labs performed.  Yes.  .   See orders.  Will return for fasting lipid profile.  Refills given on medications.  No..    Call for increasing groin pain.  Knows to call for any vaginal bleeding.  Follow up annually and prn.      After visit summary provided.

## 2015-08-25 NOTE — Patient Instructions (Signed)

## 2015-08-30 LAB — IPS PAP TEST WITH HPV

## 2015-09-29 ENCOUNTER — Other Ambulatory Visit (INDEPENDENT_AMBULATORY_CARE_PROVIDER_SITE_OTHER): Payer: BLUE CROSS/BLUE SHIELD

## 2015-09-29 DIAGNOSIS — E781 Pure hyperglyceridemia: Secondary | ICD-10-CM

## 2015-09-29 LAB — LIPID PANEL
Cholesterol: 136 mg/dL (ref 125–200)
HDL: 67 mg/dL (ref >=46)
LDL Cholesterol: 42 mg/dL (ref <130)
Total CHOL/HDL Ratio: 2 Ratio (ref <=5.0)
Triglycerides: 135 mg/dL (ref <150)
VLDL: 27 mg/dL (ref <30)

## 2015-12-29 ENCOUNTER — Telehealth: Payer: Self-pay | Admitting: Obstetrics and Gynecology

## 2015-12-29 DIAGNOSIS — L659 Nonscarring hair loss, unspecified: Secondary | ICD-10-CM

## 2015-12-29 NOTE — Telephone Encounter (Signed)
Attempted to reach patient at the number provided [phone]. There was no answer and no voicemail to leave a message.  Attempted to reach patient at home number provided [phone]. There was no answer. Left message to call [place] at [phone].

## 2015-12-29 NOTE — Telephone Encounter (Signed)
Patient is calling to see if she can have blood work done for her thyroid. She states she has had some hair lost and would like to talk with the nurse.

## 2015-12-30 NOTE — Telephone Encounter (Signed)
OK to have her thyroid function tested with TSH for alopecia.  The hair loss could also be due to Tamoxifen.

## 2015-12-30 NOTE — Telephone Encounter (Signed)
Spoke with patient. Patient states she had her hair cut yesterday and her hair dresser mentioned that her hair is thinning. Patient would like to have her thyroid level checked. "Dr.Silva asked me if I wanted to have this done at my last appointment with her and I said no, but now I think I need to." Patient is also asking if she will need her hormone levels checked. Denies any other symptoms or concerns. Advised I will speak with Dr.Silva regarding her concerns and return call with her recommendations she is agreeable.

## 2015-12-30 NOTE — Telephone Encounter (Signed)
Spoke with patient. Advised of message as seen below from [place]. She verbalizes understanding. Lab appointment scheduled for 01/04/2016 at 11:30 am. TSH ordered.  Routing to provider for final review. Patient agreeable to disposition. Will close encounter.

## 2016-01-04 ENCOUNTER — Other Ambulatory Visit (INDEPENDENT_AMBULATORY_CARE_PROVIDER_SITE_OTHER): Payer: BLUE CROSS/BLUE SHIELD

## 2016-01-04 DIAGNOSIS — L659 Nonscarring hair loss, unspecified: Secondary | ICD-10-CM

## 2016-01-05 LAB — TSH: TSH: 2.99 mIU/L

## 2016-03-01 DIAGNOSIS — M5136 Other intervertebral disc degeneration, lumbar region: Secondary | ICD-10-CM | POA: Diagnosis not present

## 2016-03-02 ENCOUNTER — Other Ambulatory Visit: Payer: Self-pay | Admitting: *Deleted

## 2016-03-02 DIAGNOSIS — C50911 Malignant neoplasm of unspecified site of right female breast: Secondary | ICD-10-CM

## 2016-03-06 ENCOUNTER — Other Ambulatory Visit (HOSPITAL_BASED_OUTPATIENT_CLINIC_OR_DEPARTMENT_OTHER): Payer: BLUE CROSS/BLUE SHIELD

## 2016-03-06 DIAGNOSIS — Z853 Personal history of malignant neoplasm of breast: Secondary | ICD-10-CM | POA: Diagnosis not present

## 2016-03-06 DIAGNOSIS — C50911 Malignant neoplasm of unspecified site of right female breast: Secondary | ICD-10-CM

## 2016-03-06 LAB — CBC WITH DIFFERENTIAL/PLATELET
BASO%: 0.4 % (ref 0.0–2.0)
Basophils Absolute: 0 10*3/uL (ref 0.0–0.1)
EOS%: 1.9 % (ref 0.0–7.0)
Eosinophils Absolute: 0.1 10*3/uL (ref 0.0–0.5)
HCT: 38.4 % (ref 34.8–46.6)
HGB: 13 g/dL (ref 11.6–15.9)
LYMPH%: 26.8 % (ref 14.0–49.7)
MCH: 31.6 pg (ref 25.1–34.0)
MCHC: 33.8 g/dL (ref 31.5–36.0)
MCV: 93.4 fL (ref 79.5–101.0)
MONO#: 0.3 10*3/uL (ref 0.1–0.9)
MONO%: 6.1 % (ref 0.0–14.0)
NEUT#: 3.5 10*3/uL (ref 1.5–6.5)
NEUT%: 64.8 % (ref 38.4–76.8)
Platelets: 174 10*3/uL (ref 145–400)
RBC: 4.11 10*6/uL (ref 3.70–5.45)
RDW: 12.5 % (ref 11.2–14.5)
WBC: 5.4 10*3/uL (ref 3.9–10.3)
lymph#: 1.4 10*3/uL (ref 0.9–3.3)

## 2016-03-06 LAB — COMPREHENSIVE METABOLIC PANEL
ALT: 13 U/L (ref 0–55)
AST: 17 U/L (ref 5–34)
Albumin: 4 g/dL (ref 3.5–5.0)
Alkaline Phosphatase: 55 U/L (ref 40–150)
Anion Gap: 7 mEq/L (ref 3–11)
BUN: 10.5 mg/dL (ref 7.0–26.0)
CO2: 28 mEq/L (ref 22–29)
Calcium: 9.6 mg/dL (ref 8.4–10.4)
Chloride: 108 mEq/L (ref 98–109)
Creatinine: 0.8 mg/dL (ref 0.6–1.1)
EGFR: 81 mL/min/{1.73_m2} — ABNORMAL LOW (ref >90)
Glucose: 102 mg/dl (ref 70–140)
Potassium: 4.1 mEq/L (ref 3.5–5.1)
Sodium: 143 mEq/L (ref 136–145)
Total Bilirubin: 0.51 mg/dL (ref 0.20–1.20)
Total Protein: 6.9 g/dL (ref 6.4–8.3)

## 2016-03-13 ENCOUNTER — Encounter: Payer: Self-pay | Admitting: Nurse Practitioner

## 2016-03-13 ENCOUNTER — Telehealth: Payer: Self-pay | Admitting: Oncology

## 2016-03-13 ENCOUNTER — Ambulatory Visit (HOSPITAL_BASED_OUTPATIENT_CLINIC_OR_DEPARTMENT_OTHER): Payer: BLUE CROSS/BLUE SHIELD | Admitting: Nurse Practitioner

## 2016-03-13 VITALS — BP 133/85 | HR 90 | Temp 98.3°F | Resp 18 | Ht 67.5 in | Wt 176.3 lb

## 2016-03-13 DIAGNOSIS — C50411 Malignant neoplasm of upper-outer quadrant of right female breast: Secondary | ICD-10-CM

## 2016-03-13 DIAGNOSIS — Z853 Personal history of malignant neoplasm of breast: Secondary | ICD-10-CM

## 2016-03-13 DIAGNOSIS — L659 Nonscarring hair loss, unspecified: Secondary | ICD-10-CM

## 2016-03-13 DIAGNOSIS — R635 Abnormal weight gain: Secondary | ICD-10-CM

## 2016-03-13 NOTE — Progress Notes (Signed)
ID: Janice [name]   DOB: 11/10/1966  MR#: [id number]  EHM#:[id number]   PCP:  Josefa Half, MD GYN:  Josefa Half, MD SUR:  OTHER:  Ila Mcgill, DPM  CHIEF COMPLAINT:  Estrogen receptor positive breast cancer  CURRENT TREATMENT: Tamoxifen   HISTORY OF PRESENT ILLNESS: From the original intake note:  She had a screening mammogram in October 2007, which showed some calcifications in the right breast.  Diagnostic mammogram of the right breast October 17, showed these calcifications to be most likely benign, but a six-month mammogram was recommended.  She then had a unilateral right diagnostic mammogram on February 22, 2006, which showed some increase in the number of microcalcifications in the right breast upper-outer quadrant as compared to the prior study.  The same day a biopsy was performed and showed ([id number] and [id number]) an invasive ductal carcinoma, which was strongly ER and PR positive, with an elevated proliferation marker at 31%.  Hercept test negative and negative for HER-2 amplification by FISH.    With this information, the patient was referred to Dr. Annamaria Boots, and MRI of both breasts was obtained April 14. This showed only the solitary area of abnormal enhancement corresponding to the previously biopsied area.  There was no obvious lymphadenopathy.    Accordingly, on March 12, 2006, the patient proceeded to definitive local surgery.  Unfortunately, the sentinel lymph node was positive, so she proceeded to full axillary dissection, as well as of course the right lumpectomy.  The final pathology ([id number]) showed a 1.2 cm infiltrating ductal carcinoma, grade 3, with a total of 2 of 23 lymph nodes involved (an additional lymph node had isolated tumor cells).  Margins were negative and ample.  LVI was present.    Her subsequent history is as detailed below.  INTERVAL HISTORY: Janice [name] returns today for follow-up of her right-sided breast cancer. She has been on tamoxifen since January 2008 and  tolerates this drug reasonably well. Her hot flashes are mild and she denies vaginal changes. She was told by her PCP that it is probably what is causing her hair thinning. She had her thyroid tested an that was normal. The interval history not remarkable for any health significant events.  ROS: Janice [name] is concerned about weight gain. She is walking 30 minutes daily but feels like she is plateauing. She is making reasonable diet choices and quit soda, she does drink juice most days. A detailed review of systems today was otherwise entirely negative.  PAST MEDICAL HISTORY: Past Medical History  Diagnosis Date  . History of breast cancer 03/13/2012  . Crohn's disease ([place])   . Genital warts     as a teenager and were frozen and never returned  Significant chiefly for Crohn's disease.  This is followed by Dr. Cristina Gong.  The patient has had prior surgery for Crohn's.  She has also had removal of a left breast cyst remotely by Dr. Zettie Pho.  The Crohn's surgery was performed by Dr. Excell Seltzer.    FAMILY HISTORY The patient's father is alive at age 39.  The patient's mother is alive at age 12.  The patient has a sister, who had cervical cancer in her 8s, but is now fine.  There is no history of breast or ovarian cancer in the family.  GYNECOLOGIC HISTORY:  (Updated 03/04/2014) She is GX P2, first pregnancy age 70.  She nursed a little bit with both pregnancies.  Last menstrual period was April 2007.  SOCIAL HISTORY:  (updated 03/04/2014) The patient is  a homemaker.  Her husband, Janice [name], is in the building trade.    She has a daughter age 42, who is attending [employer] for criminal justice, and a son age 52 currently in [place] working in Scientist, research (life sciences) estate.  The patient attends [employer].     ADVANCED DIRECTIVES: not in place  HEALTH MAINTENANCE: (Updated 03/04/2014) Social History  Substance Use Topics  . Smoking status: Never Smoker   . Smokeless tobacco: Never Used  . Alcohol Use: No      Colonoscopy: Buccini, February 2011   PAP:  September 2014/Silva  Bone density:  September 2014   Lipid panel:  April 2014    Allergies  Allergen Reactions  . Percocet [Oxycodone-Acetaminophen] Nausea And Vomiting    Current Outpatient Prescriptions  Medication Sig Dispense Refill  . cholecalciferol (VITAMIN D) 400 UNITS TABS Take 5,000 Units by mouth daily.    Marland Kitchen glucosamine-chondroitin 500-400 MG tablet Take 1 tablet by mouth daily.    . Multiple Vitamin (MULTIVITAMIN) tablet Take 1 tablet by mouth daily.    . tamoxifen (NOLVADEX) 20 MG tablet TAKE 1 TABLET BY MOUTH DAILY 90 tablet 4   No current facility-administered medications for this visit.    OBJECTIVE: Middle-aged white woman who appears stated age 50 Vitals:   03/13/16 1311  BP: 133/85  Pulse: 90  Temp: 98.3 F (36.8 C)  Resp: 18     Body mass index is 27.19 kg/(m^2).    ECOG FS: 0 Filed Weights   03/13/16 1311  Weight: 176 lb 4.8 oz (79.969 kg)   Skin: warm, dry  HEENT: sclerae anicteric, conjunctivae pink, oropharynx clear. No thrush or mucositis.  Lymph Nodes: No cervical or supraclavicular lymphadenopathy  Lungs: clear to auscultation bilaterally, no rales, wheezes, or rhonci  Heart: regular rate and rhythm  Abdomen: round, soft, non tender, positive bowel sounds  Musculoskeletal: No focal spinal tenderness, no peripheral edema  Neuro: non focal, well oriented, positive affect  Breasts: right breast status post lumpectomy and radiation. No evidence of recurrent disease. Right axilla benign. Left breast unremarkable.  LAB RESULTS:   Lab Results  Component Value Date   WBC 5.4 03/06/2016   NEUTROABS 3.5 03/06/2016   HGB 13.0 03/06/2016   HCT 38.4 03/06/2016   MCV 93.4 03/06/2016   PLT 174 03/06/2016      Chemistry      Component Value Date/Time   NA 143 03/06/2016 1310   NA 141 03/28/2012 1315   K 4.1 03/06/2016 1310   K 3.9 03/28/2012 1315   CL 106 02/18/2013 0829   CL 105  03/28/2012 1315   CO2 28 03/06/2016 1310   CO2 29 03/28/2012 1315   BUN 10.5 03/06/2016 1310   BUN 11 03/28/2012 1315   CREATININE 0.8 03/06/2016 1310   CREATININE 0.87 03/28/2012 1315      Component Value Date/Time   CALCIUM 9.6 03/06/2016 1310   CALCIUM 9.5 03/28/2012 1315   ALKPHOS 55 03/06/2016 1310   ALKPHOS 47 03/28/2012 1315   AST 17 03/06/2016 1310   AST 15 03/28/2012 1315   ALT 13 03/06/2016 1310   ALT 12 03/28/2012 1315   BILITOT 0.51 03/06/2016 1310   BILITOT 0.6 03/28/2012 1315      STUDIES: No results found.  EXAM: DIGITAL SCREENING BILATERAL MAMMOGRAM WITH 3D TOMO WITH CAD  COMPARISON: Previous exam(s).  ACR Breast Density Category b: There are scattered areas of fibroglandular density.  FINDINGS: There are no findings suspicious for malignancy. Images  were processed with CAD.  IMPRESSION: No mammographic evidence of malignancy. A result letter of this screening mammogram will be mailed directly to the patient.  RECOMMENDATION: Screening mammogram in one year. (Code:SM-B-01Y)  BI-RADS CATEGORY 1: Negative.   Electronically Signed  By: Ammie Ferrier M.D.  On: 08/09/2015 14:13  ASSESSMENT: 50 y.o.  BRCA negative Janice [name] woman   (1)  status post right lumpectomy and axillary lymph node dissection April of 2007 for a T1c N1, stage IIB invasive ductal carcinoma, grade 2, estrogen and progesterone receptor positive, HER-2 negative  with an MIB-1 of 31%   (2) treated adjuvantly with Cytoxan and Adriamycin x4 in dose dense fashion followed by weekly paclitaxel x12   (3) s/p adjuvant radiation completed in January of 2008   (4) on tamoxifen as of January of 2008, the plan being to go for total of 10 years.   (5) discussed the possibility of additional genetics testing April 2016; declined  PLAN:  Janice [name] is doing well as far as her breast cancer is concerned. She is now 10 years out from her definitive surgery with no evidence of  recurrent diease. Her most recent mammogram was benign. The labs were reviewed in detail and were entirely normal. She is tolerating the tamoxifen well and will continue this drug until January of next year to complete 10 years of antiestrogen therapy. We discussed her hair thinning, and this is complaint I have heard a few times from patients on tamoxifen. I offered her a dermatology consult, and she declined. She is going to try hair, skin, and nail vitamins because her nails are fairly brittle as well. As far as weight gain is concerned, she knows this has less to do with tamoxifen and more to do with menopause in general. She is going to avoid carbs, and increase her water and vegetable intake.  Danyel will have a repeat mammogram this September. She will return in January for follow up with Dr. Jana Hakim. During this visit she will be eligible to "graduate" from observation here and discontinue tamoxifen. She understands and agrees with this plan. She knows the goal of treatment in her case is cure. She has been encouraged to call with any issues that might arise before her next visit here.   Total time spent in appointment was 25 minutes with greater than 50% of the time spent face to face with the patient.  Laurie Panda, NP   03/13/2016

## 2016-03-13 NOTE — Telephone Encounter (Signed)
appt made and avs printed °

## 2016-03-16 DIAGNOSIS — M545 Low back pain: Secondary | ICD-10-CM | POA: Diagnosis not present

## 2016-03-22 DIAGNOSIS — M545 Low back pain: Secondary | ICD-10-CM | POA: Diagnosis not present

## 2016-04-12 DIAGNOSIS — M545 Low back pain: Secondary | ICD-10-CM | POA: Diagnosis not present

## 2016-04-19 ENCOUNTER — Other Ambulatory Visit: Payer: Self-pay | Admitting: Oncology

## 2016-04-19 DIAGNOSIS — M5136 Other intervertebral disc degeneration, lumbar region: Secondary | ICD-10-CM | POA: Diagnosis not present

## 2016-04-19 DIAGNOSIS — M545 Low back pain: Secondary | ICD-10-CM | POA: Diagnosis not present

## 2016-04-19 DIAGNOSIS — Z1231 Encounter for screening mammogram for malignant neoplasm of breast: Secondary | ICD-10-CM

## 2016-05-04 DIAGNOSIS — M545 Low back pain: Secondary | ICD-10-CM | POA: Diagnosis not present

## 2016-06-01 DIAGNOSIS — M47817 Spondylosis without myelopathy or radiculopathy, lumbosacral region: Secondary | ICD-10-CM | POA: Diagnosis not present

## 2016-06-01 DIAGNOSIS — M545 Low back pain: Secondary | ICD-10-CM | POA: Diagnosis not present

## 2016-07-13 DIAGNOSIS — L821 Other seborrheic keratosis: Secondary | ICD-10-CM | POA: Diagnosis not present

## 2016-07-13 DIAGNOSIS — L814 Other melanin hyperpigmentation: Secondary | ICD-10-CM | POA: Diagnosis not present

## 2016-07-13 DIAGNOSIS — I788 Other diseases of capillaries: Secondary | ICD-10-CM | POA: Diagnosis not present

## 2016-07-19 DIAGNOSIS — M4306 Spondylolysis, lumbar region: Secondary | ICD-10-CM | POA: Diagnosis not present

## 2016-07-19 DIAGNOSIS — M545 Low back pain: Secondary | ICD-10-CM | POA: Diagnosis not present

## 2016-07-19 DIAGNOSIS — M47817 Spondylosis without myelopathy or radiculopathy, lumbosacral region: Secondary | ICD-10-CM | POA: Diagnosis not present

## 2016-08-09 ENCOUNTER — Ambulatory Visit
Admission: RE | Admit: 2016-08-09 | Discharge: 2016-08-09 | Disposition: A | Discharge status: Home / Self Care | Payer: BLUE CROSS/BLUE SHIELD | Source: Ambulatory Visit | Attending: Oncology | Admitting: Oncology

## 2016-08-09 DIAGNOSIS — M47817 Spondylosis without myelopathy or radiculopathy, lumbosacral region: Secondary | ICD-10-CM | POA: Diagnosis not present

## 2016-08-09 DIAGNOSIS — M545 Low back pain: Secondary | ICD-10-CM | POA: Diagnosis not present

## 2016-08-09 DIAGNOSIS — Z1231 Encounter for screening mammogram for malignant neoplasm of breast: Secondary | ICD-10-CM

## 2016-08-09 DIAGNOSIS — M6281 Muscle weakness (generalized): Secondary | ICD-10-CM | POA: Diagnosis not present

## 2016-08-16 DIAGNOSIS — M6281 Muscle weakness (generalized): Secondary | ICD-10-CM | POA: Diagnosis not present

## 2016-08-16 DIAGNOSIS — M545 Low back pain: Secondary | ICD-10-CM | POA: Diagnosis not present

## 2016-08-16 DIAGNOSIS — M47817 Spondylosis without myelopathy or radiculopathy, lumbosacral region: Secondary | ICD-10-CM | POA: Diagnosis not present

## 2016-08-18 DIAGNOSIS — M47817 Spondylosis without myelopathy or radiculopathy, lumbosacral region: Secondary | ICD-10-CM | POA: Diagnosis not present

## 2016-08-18 DIAGNOSIS — M6281 Muscle weakness (generalized): Secondary | ICD-10-CM | POA: Diagnosis not present

## 2016-08-18 DIAGNOSIS — M545 Low back pain: Secondary | ICD-10-CM | POA: Diagnosis not present

## 2016-08-23 DIAGNOSIS — M6281 Muscle weakness (generalized): Secondary | ICD-10-CM | POA: Diagnosis not present

## 2016-08-23 DIAGNOSIS — M545 Low back pain: Secondary | ICD-10-CM | POA: Diagnosis not present

## 2016-08-23 DIAGNOSIS — M47817 Spondylosis without myelopathy or radiculopathy, lumbosacral region: Secondary | ICD-10-CM | POA: Diagnosis not present

## 2016-08-24 DIAGNOSIS — M545 Low back pain: Secondary | ICD-10-CM | POA: Diagnosis not present

## 2016-08-24 DIAGNOSIS — M47817 Spondylosis without myelopathy or radiculopathy, lumbosacral region: Secondary | ICD-10-CM | POA: Diagnosis not present

## 2016-08-24 DIAGNOSIS — M6281 Muscle weakness (generalized): Secondary | ICD-10-CM | POA: Diagnosis not present

## 2016-08-29 DIAGNOSIS — M6281 Muscle weakness (generalized): Secondary | ICD-10-CM | POA: Diagnosis not present

## 2016-08-29 DIAGNOSIS — M47817 Spondylosis without myelopathy or radiculopathy, lumbosacral region: Secondary | ICD-10-CM | POA: Diagnosis not present

## 2016-08-29 DIAGNOSIS — M545 Low back pain: Secondary | ICD-10-CM | POA: Diagnosis not present

## 2016-09-06 ENCOUNTER — Ambulatory Visit (INDEPENDENT_AMBULATORY_CARE_PROVIDER_SITE_OTHER): Payer: BLUE CROSS/BLUE SHIELD | Admitting: Obstetrics and Gynecology

## 2016-09-06 ENCOUNTER — Encounter: Payer: Self-pay | Admitting: Obstetrics and Gynecology

## 2016-09-06 VITALS — BP 124/62 | HR 84 | Resp 18 | Ht 67.0 in | Wt 175.0 lb

## 2016-09-06 DIAGNOSIS — M8580 Other specified disorders of bone density and structure, unspecified site: Secondary | ICD-10-CM

## 2016-09-06 DIAGNOSIS — Z0000 Encounter for general adult medical examination without abnormal findings: Secondary | ICD-10-CM | POA: Diagnosis not present

## 2016-09-06 DIAGNOSIS — Z01419 Encounter for gynecological examination (general) (routine) without abnormal findings: Secondary | ICD-10-CM | POA: Diagnosis not present

## 2016-09-06 LAB — POCT URINALYSIS DIPSTICK
Bilirubin, UA: NEGATIVE
Blood, UA: NEGATIVE
Glucose, UA: NEGATIVE
Ketones, UA: NEGATIVE
Leukocytes, UA: NEGATIVE
Nitrite, UA: NEGATIVE
Protein, UA: NEGATIVE
Urobilinogen, UA: NEGATIVE
pH, UA: 5

## 2016-09-06 NOTE — Patient Instructions (Signed)

## 2016-09-06 NOTE — Progress Notes (Signed)
50 y.o. G28P2002 Married Caucasian female here for annual exam.    Wants pap next year.   On Tamoxifen.  Will see oncology in January and consider stopping then.   Having low back pain.  Receiving epidural injections at [facility].  Will do PT now.  Has disc herniation and arthritis.  May do radiofrequency ablation.  Declines labs.  Working from home.  Family is well.   PCP: patient does not have PCP    Patient's last menstrual period was 03/20/2006.           Sexually active: Yes.    The current method of family planning is post menopausal status.    Exercising: Yes.    walking Smoker:  no  Health Maintenance: Pap:  08/25/15 Pap and HR HPV negative History of abnormal Pap:  no MMG:  08/09/16 BIRADS1 negative Colonoscopy:  2016 1 small polyp with Dr. Cristina Gong. Next due 2019/2020. BMD:   Osteopenia in 2012.   TDaP:  08/12/14 Screening Labs:  Hb today: done in April 2017, Urine today: Normal   reports that she has never smoked. She has never used smokeless tobacco. She reports that she does not drink alcohol or use drugs.  Past Medical History:  Diagnosis Date  . Crohn's disease ([place])   . Genital warts    as a teenager and were frozen and never returned  . History of breast cancer 03/13/2012    Past Surgical History:  Procedure Laterality Date  . BREAST SURGERY  2007   Rt.lumpectomy--Br. CA, lymph node dissection  1 Pos., Chemo, RX, Tamoxifen  . CHOLECYSTECTOMY    . COLON RESECTION  2004   d/t crohns dz--Dr. Excell Seltzer  . ENDOMETRIAL BIOPSY     11/2009 benign endometrial polyp; 06/2011 wnl  . HYSTEROSCOPY  12-09-2009   polyp resection and D &C --Dr. Joan Flores    Current Outpatient Prescriptions  Medication Sig Dispense Refill  . BIOTIN PO Take by mouth 3 (three) times a week.    . cholecalciferol (VITAMIN D) 400 UNITS TABS Take 5,000 Units by mouth daily.    Marland Kitchen glucosamine-chondroitin 500-400 MG tablet Take 1 tablet by mouth daily.    . Multiple Vitamin  (MULTIVITAMIN) tablet Take 1 tablet by mouth daily.    . tamoxifen (NOLVADEX) 20 MG tablet TAKE 1 TABLET BY MOUTH DAILY 90 tablet 4   No current facility-administered medications for this visit.     Family History  Problem Relation Age of Onset  . Heart attack Father   . Hypertension Maternal Grandmother     ROS:  Pertinent items are noted in HPI.  Otherwise, a comprehensive ROS was negative.  Exam:   BP 124/62 (BP Location: Right Arm, Patient Position: Sitting, Cuff Size: Normal)   Pulse 84   Resp 18   Ht 5' 7"  (1.702 m)   Wt 175 lb (79.4 kg)   LMP 03/20/2006   BMI 27.41 kg/m     General appearance: alert, cooperative and appears stated age Head: Normocephalic, without obvious abnormality, atraumatic Neck: no adenopathy, supple, symmetrical, trachea midline and thyroid normal to inspection and palpation Lungs: clear to auscultation bilaterally Breasts: normal appearance, no masses or tenderness, No nipple retraction or dimpling, No nipple discharge or bleeding, No axillary or supraclavicular adenopathy Heart: regular rate and rhythm Abdomen: soft, non-tender; no masses, no organomegaly Extremities: extremities normal, atraumatic, no cyanosis or edema Skin: Skin color, texture, turgor normal. No rashes or lesions Lymph nodes: Cervical, supraclavicular, and axillary nodes normal. No  abnormal inguinal nodes palpated Neurologic: Grossly normal  Pelvic: External genitalia:  no lesions              Urethra:  normal appearing urethra with no masses, tenderness or lesions              Bartholins and Skenes: normal                 Vagina: normal appearing vagina with normal color and discharge, no lesions              Cervix: no lesions              Pap taken: No. Bimanual Exam:  Uterus:  normal size, contour, position, consistency, mobility, non-tender              Adnexa: no mass, fullness, tenderness              Rectal exam: Yes.  .  Confirms.              Anus:  normal  sphincter tone, no lesions  Chaperone was present for exam.  Assessment:   Well woman visit with normal exam. Hx right breast cancer.  On Tamoxifen.  Mild osteopenia.   Plan: Yearly mammogram recommended after age 85.  Recommended self breast exam.  Pap and HR HPV as above.  States she may want pap next year.  Discussed Calcium, Vitamin D, regular exercise program including cardiovascular and weight bearing exercise. Order placed for BMD at [facility].  Patient will call to schedule.  Follow up annually and prn.       After visit summary provided.

## 2016-09-12 DIAGNOSIS — M6281 Muscle weakness (generalized): Secondary | ICD-10-CM | POA: Diagnosis not present

## 2016-09-12 DIAGNOSIS — M545 Low back pain: Secondary | ICD-10-CM | POA: Diagnosis not present

## 2016-09-12 DIAGNOSIS — M47817 Spondylosis without myelopathy or radiculopathy, lumbosacral region: Secondary | ICD-10-CM | POA: Diagnosis not present

## 2016-09-14 DIAGNOSIS — M6281 Muscle weakness (generalized): Secondary | ICD-10-CM | POA: Diagnosis not present

## 2016-09-14 DIAGNOSIS — M545 Low back pain: Secondary | ICD-10-CM | POA: Diagnosis not present

## 2016-09-14 DIAGNOSIS — M47817 Spondylosis without myelopathy or radiculopathy, lumbosacral region: Secondary | ICD-10-CM | POA: Diagnosis not present

## 2016-09-20 DIAGNOSIS — M47817 Spondylosis without myelopathy or radiculopathy, lumbosacral region: Secondary | ICD-10-CM | POA: Diagnosis not present

## 2016-09-20 DIAGNOSIS — M6281 Muscle weakness (generalized): Secondary | ICD-10-CM | POA: Diagnosis not present

## 2016-09-20 DIAGNOSIS — M545 Low back pain: Secondary | ICD-10-CM | POA: Diagnosis not present

## 2016-09-27 DIAGNOSIS — M47817 Spondylosis without myelopathy or radiculopathy, lumbosacral region: Secondary | ICD-10-CM | POA: Diagnosis not present

## 2016-09-27 DIAGNOSIS — M545 Low back pain: Secondary | ICD-10-CM | POA: Diagnosis not present

## 2016-09-28 ENCOUNTER — Other Ambulatory Visit: Payer: Self-pay | Admitting: Oncology

## 2016-10-10 DIAGNOSIS — M47817 Spondylosis without myelopathy or radiculopathy, lumbosacral region: Secondary | ICD-10-CM | POA: Diagnosis not present

## 2016-10-25 DIAGNOSIS — M47817 Spondylosis without myelopathy or radiculopathy, lumbosacral region: Secondary | ICD-10-CM | POA: Diagnosis not present

## 2016-10-25 DIAGNOSIS — M545 Low back pain: Secondary | ICD-10-CM | POA: Diagnosis not present

## 2016-11-03 DIAGNOSIS — M4306 Spondylolysis, lumbar region: Secondary | ICD-10-CM | POA: Diagnosis not present

## 2016-11-03 DIAGNOSIS — M47817 Spondylosis without myelopathy or radiculopathy, lumbosacral region: Secondary | ICD-10-CM | POA: Diagnosis not present

## 2016-11-03 DIAGNOSIS — M545 Low back pain: Secondary | ICD-10-CM | POA: Diagnosis not present

## 2016-11-15 DIAGNOSIS — M47817 Spondylosis without myelopathy or radiculopathy, lumbosacral region: Secondary | ICD-10-CM | POA: Diagnosis not present

## 2016-11-15 DIAGNOSIS — M545 Low back pain: Secondary | ICD-10-CM | POA: Diagnosis not present

## 2016-11-15 DIAGNOSIS — M4306 Spondylolysis, lumbar region: Secondary | ICD-10-CM | POA: Diagnosis not present

## 2016-11-16 ENCOUNTER — Other Ambulatory Visit: Payer: Self-pay | Admitting: Oncology

## 2016-11-16 DIAGNOSIS — Z1231 Encounter for screening mammogram for malignant neoplasm of breast: Secondary | ICD-10-CM

## 2016-11-20 DIAGNOSIS — M8580 Other specified disorders of bone density and structure, unspecified site: Secondary | ICD-10-CM

## 2016-11-20 DIAGNOSIS — R87610 Atypical squamous cells of undetermined significance on cytologic smear of cervix (ASC-US): Secondary | ICD-10-CM

## 2016-11-20 HISTORY — DX: Atypical squamous cells of undetermined significance on cytologic smear of cervix (ASC-US): R87.610

## 2016-11-20 HISTORY — DX: Other specified disorders of bone density and structure, unspecified site: M85.80

## 2016-11-27 ENCOUNTER — Telehealth: Payer: Self-pay | Admitting: Oncology

## 2016-11-27 NOTE — Telephone Encounter (Signed)
spoke to patient to advise that appointment time had changed to 2:30 PM on same day 11/29/16

## 2016-11-28 ENCOUNTER — Other Ambulatory Visit: Payer: Self-pay | Admitting: *Deleted

## 2016-11-28 DIAGNOSIS — C50911 Malignant neoplasm of unspecified site of right female breast: Secondary | ICD-10-CM

## 2016-11-29 ENCOUNTER — Ambulatory Visit (HOSPITAL_BASED_OUTPATIENT_CLINIC_OR_DEPARTMENT_OTHER): Payer: BLUE CROSS/BLUE SHIELD | Admitting: Oncology

## 2016-11-29 ENCOUNTER — Other Ambulatory Visit (HOSPITAL_BASED_OUTPATIENT_CLINIC_OR_DEPARTMENT_OTHER): Payer: BLUE CROSS/BLUE SHIELD

## 2016-11-29 VITALS — BP 140/84 | HR 107 | Temp 98.2°F | Resp 16 | Ht 67.0 in | Wt 176.5 lb

## 2016-11-29 DIAGNOSIS — Z79810 Long term (current) use of selective estrogen receptor modulators (SERMs): Secondary | ICD-10-CM

## 2016-11-29 DIAGNOSIS — Z170 Estrogen receptor positive status [ER+]: Secondary | ICD-10-CM

## 2016-11-29 DIAGNOSIS — C50911 Malignant neoplasm of unspecified site of right female breast: Secondary | ICD-10-CM

## 2016-11-29 DIAGNOSIS — Z853 Personal history of malignant neoplasm of breast: Secondary | ICD-10-CM | POA: Diagnosis not present

## 2016-11-29 LAB — CBC WITH DIFFERENTIAL/PLATELET
BASO%: 0.4 % (ref 0.0–2.0)
Basophils Absolute: 0 10*3/uL (ref 0.0–0.1)
EOS%: 1.7 % (ref 0.0–7.0)
Eosinophils Absolute: 0.1 10*3/uL (ref 0.0–0.5)
HCT: 38.5 % (ref 34.8–46.6)
HGB: 13.3 g/dL (ref 11.6–15.9)
LYMPH%: 28.1 % (ref 14.0–49.7)
MCH: 32.5 pg (ref 25.1–34.0)
MCHC: 34.6 g/dL (ref 31.5–36.0)
MCV: 93.9 fL (ref 79.5–101.0)
MONO#: 0.3 10*3/uL (ref 0.1–0.9)
MONO%: 5 % (ref 0.0–14.0)
NEUT#: 3.8 10*3/uL (ref 1.5–6.5)
NEUT%: 64.8 % (ref 38.4–76.8)
Platelets: 190 10*3/uL (ref 145–400)
RBC: 4.1 10*6/uL (ref 3.70–5.45)
RDW: 12.7 % (ref 11.2–14.5)
WBC: 5.8 10*3/uL (ref 3.9–10.3)
lymph#: 1.6 10*3/uL (ref 0.9–3.3)

## 2016-11-29 LAB — COMPREHENSIVE METABOLIC PANEL
ALT: 20 U/L (ref 0–55)
AST: 22 U/L (ref 5–34)
Albumin: 4.1 g/dL (ref 3.5–5.0)
Alkaline Phosphatase: 60 U/L (ref 40–150)
Anion Gap: 8 mEq/L (ref 3–11)
BUN: 11.6 mg/dL (ref 7.0–26.0)
CO2: 27 mEq/L (ref 22–29)
Calcium: 9.7 mg/dL (ref 8.4–10.4)
Chloride: 108 mEq/L (ref 98–109)
Creatinine: 0.8 mg/dL (ref 0.6–1.1)
EGFR: 86 mL/min/{1.73_m2} — ABNORMAL LOW (ref >90)
Glucose: 87 mg/dl (ref 70–140)
Potassium: 3.9 mEq/L (ref 3.5–5.1)
Sodium: 144 mEq/L (ref 136–145)
Total Bilirubin: 0.55 mg/dL (ref 0.20–1.20)
Total Protein: 7 g/dL (ref 6.4–8.3)

## 2016-11-29 NOTE — Progress Notes (Signed)
ID: Konrad Felix   DOB: 10-Jun-1966  MR#: [id number]  TWS#:[id number]   PCP:  Josefa Half, MD GYN:  Josefa Half, MD SUR:  OTHER:  Ila Mcgill, DPM  CHIEF COMPLAINT:  Estrogen receptor positive breast cancer  CURRENT TREATMENT: Completing 10 years of tamoxifen   HISTORY OF PRESENT ILLNESS: From the original intake note:  She had a screening mammogram in October 2007, which showed some calcifications in the right breast.  Diagnostic mammogram of the right breast October 17, showed these calcifications to be most likely benign, but a six-month mammogram was recommended.  She then had a unilateral right diagnostic mammogram on February 22, 2006, which showed some increase in the number of microcalcifications in the right breast upper-outer quadrant as compared to the prior study.  The same day a biopsy was performed and showed ([id number] and [id number]) an invasive ductal carcinoma, which was strongly ER and PR positive, with an elevated proliferation marker at 31%.  Hercept test negative and negative for HER-2 amplification by FISH.    With this information, the patient was referred to Dr. Annamaria Boots, and MRI of both breasts was obtained April 14. This showed only the solitary area of abnormal enhancement corresponding to the previously biopsied area.  There was no obvious lymphadenopathy.    Accordingly, on March 12, 2006, the patient proceeded to definitive local surgery.  Unfortunately, the sentinel lymph node was positive, so she proceeded to full axillary dissection, as well as of course the right lumpectomy.  The final pathology ([id number]) showed a 1.2 cm infiltrating ductal carcinoma, grade 3, with a total of 2 of 23 lymph nodes involved (an additional lymph node had isolated tumor cells).  Margins were negative and ample.  LVI was present.    Her subsequent history is as detailed below.  INTERVAL HISTORY: Nyelli returns today for follow-up of her estrogen receptor positive breast cancer. She  continues on tamoxifen with excellent tolerance. She denies problems with hot flashes, vaginal wetness or vaginal dryness, and she obtains a drug at a good price. The question is whether we should continue this drug for "get out of the cancer business" since she has now completed 10 years of anti-estrogens.  ROS: Sclerae still worries about weight gain. She still has some hair loss although this is not easily apparent. She has developed significant low back pain, this is been evaluated thoroughly by orthopedics, and she has had 2 epidural treatments through the [facility], which I provided some relief. She also tried on physical therapy which did not help very much. Aside from all these issues a detailed review of systems today was noncontributory  PAST MEDICAL HISTORY: Past Medical History:  Diagnosis Date  . Crohn's disease ([place])   . Genital warts    as a teenager and were frozen and never returned  . History of breast cancer 03/13/2012  Significant chiefly for Crohn's disease.  This is followed by Dr. Cristina Gong.  The patient has had prior surgery for Crohn's.  She has also had removal of a left breast cyst remotely by Dr. Zettie Pho.  The Crohn's surgery was performed by Dr. Excell Seltzer.    FAMILY HISTORY The patient's father is alive at age 39.  The patient's mother is alive at age 109.  The patient has a sister, who had cervical cancer in her 39s, but is now fine.  There is no history of breast or ovarian cancer in the family.  GYNECOLOGIC HISTORY:  (Updated 03/04/2014) She is GX P2, first  pregnancy age 16.  She nursed a little bit with both pregnancies.  Last menstrual period was April 2007.  SOCIAL HISTORY:  (updated 03/04/2014) The patient is a homemaker.  Her husband, Sherren Mocha, is in the building trade.    She has a daughter age 73, who is attending [employer] for criminal justice, and a son age 43 currently in [place] working in Scientist, research (life sciences) estate.  The patient attends [employer].     ADVANCED DIRECTIVES: not in place  HEALTH MAINTENANCE: (Updated 03/04/2014) Social History  Substance Use Topics  . Smoking status: Never Smoker  . Smokeless tobacco: Never Used  . Alcohol use No     Colonoscopy: Buccini, February 2011   PAP:  September 2014/Silva  Bone density:  September 2014   Lipid panel:  April 2014    Allergies  Allergen Reactions  . Percocet [Oxycodone-Acetaminophen] Nausea And Vomiting    Current Outpatient Prescriptions  Medication Sig Dispense Refill  . BIOTIN PO Take by mouth 3 (three) times a week.    . cholecalciferol (VITAMIN D) 400 UNITS TABS Take 5,000 Units by mouth daily.    Marland Kitchen glucosamine-chondroitin 500-400 MG tablet Take 1 tablet by mouth daily.    . Multiple Vitamin (MULTIVITAMIN) tablet Take 1 tablet by mouth daily.    . tamoxifen (NOLVADEX) 20 MG tablet TAKE 1 TABLET BY MOUTH DAILY 90 tablet 4   No current facility-administered medications for this visit.     OBJECTIVE: Middle-aged white woman who appears Well  Vitals:   11/29/16 1411  BP: 140/84  Pulse: (!) 107  Resp: 16  Temp: 98.2 F (36.8 C)     Body mass index is 27.64 kg/m.    ECOG FS: 0 Filed Weights   11/29/16 1411  Weight: 176 lb 8 oz (80.1 kg)   Sclerae unicteric, pupils round and equal Oropharynx clear and moist-- no thrush or other lesions No cervical or supraclavicular adenopathy Lungs no rales or rhonchi Heart regular rate and rhythm Abd soft, nontender, positive bowel sounds MSK no focal spinal tenderness, no upper extremity lymphedema Neuro: nonfocal, well oriented, appropriate affect Breasts: The right breast is status post lumpectomy followed by radiation with no evidence of local recurrence. The right axilla is benign. Left breast is benign.   LAB RESULTS:   Lab Results  Component Value Date   WBC 5.8 11/29/2016   NEUTROABS 3.8 11/29/2016   HGB 13.3 11/29/2016   HCT 38.5 11/29/2016   MCV 93.9 11/29/2016   PLT 190 11/29/2016       Chemistry      Component Value Date/Time   NA 143 03/06/2016 1310   K 4.1 03/06/2016 1310   CL 106 02/18/2013 0829   CO2 28 03/06/2016 1310   BUN 10.5 03/06/2016 1310   CREATININE 0.8 03/06/2016 1310      Component Value Date/Time   CALCIUM 9.6 03/06/2016 1310   ALKPHOS 55 03/06/2016 1310   AST 17 03/06/2016 1310   ALT 13 03/06/2016 1310   BILITOT 0.51 03/06/2016 1310      STUDIES: CLINICAL DATA:  Screening.  EXAM: 2D DIGITAL SCREENING BILATERAL MAMMOGRAM WITH CAD AND ADJUNCT TOMO  COMPARISON:  Previous exam(s).  ACR Breast Density Category b: There are scattered areas of fibroglandular density.  FINDINGS: There are no findings suspicious for malignancy. Images were processed with CAD.  IMPRESSION: No mammographic evidence of malignancy. A result letter of this screening mammogram will be mailed directly to the patient.  RECOMMENDATION: Screening mammogram  in one year. (Code:SM-B-01Y)  BI-RADS CATEGORY  1: Negative.   Electronically Signed   By: Marin Olp M.D.   On: 08/10/2016 16:46   ASSESSMENT: 51 y.o.  BRCA negative [place] woman   (1)  status post right lumpectomy and axillary lymph node dissection April of 2007 for a T1c N1, stage IIB invasive ductal carcinoma, grade 2, estrogen and progesterone receptor positive, HER-2 negative  with an MIB-1 of 31%   (2) treated adjuvantly with Cytoxan and Adriamycin x4 in dose dense fashion followed by weekly paclitaxel x12   (3) s/p adjuvant radiation completed in January of 2008   (4) on tamoxifen as of January of 2008, Completing 10 years January 2018  (5) discussed the possibility of additional genetics testing April 2016; declined  PLAN:  Tesa is now 10-1/2 years out from definitive surgery for her breast cancer with no evidence of disease recurrence. This is very favorable.  We reviewed her mammogram which shows the breast density to be category B, which is very favorable as  well.  She completes 10 years of anti-estrogens today. She understands we do not have data for continuing beyond 10 years. This does not mean that continuing beyond 10 years is wrong. Patients who take raloxifene, which is practically identical to tamoxifen, frequently take it for 20 years without it being a problem.  The issue however is that it is not clear we are accomplishing anything by continuing the tamoxifen. We know that the benefits of the second 5 years of tamoxifen in most cases is in the single digit percentage risk reduction.  Nevertheless she is very anxious about discontinuing this medication. We talked about menopausal symptoms, we talked about tamoxifen side effects, and we decided at this point she is going to go off the medication.  She is going to return to see me in October, after her mammogram this year. At that point we will decide whether she is ready to "graduate", wishes to transition to survivorship, wishes to continue to see me yearly off treatment, or wishes to return to some form of anti-estrogen, possibly raloxifene  She knows to call for any problems that may develop before her next visit here. Chauncey Cruel, MD   11/29/2016

## 2017-01-04 DIAGNOSIS — M4306 Spondylolysis, lumbar region: Secondary | ICD-10-CM | POA: Diagnosis not present

## 2017-01-04 DIAGNOSIS — M47817 Spondylosis without myelopathy or radiculopathy, lumbosacral region: Secondary | ICD-10-CM | POA: Diagnosis not present

## 2017-01-04 DIAGNOSIS — M545 Low back pain: Secondary | ICD-10-CM | POA: Diagnosis not present

## 2017-01-13 DIAGNOSIS — H10023 Other mucopurulent conjunctivitis, bilateral: Secondary | ICD-10-CM | POA: Diagnosis not present

## 2017-03-02 ENCOUNTER — Encounter: Payer: Self-pay | Admitting: Certified Nurse Midwife

## 2017-03-02 ENCOUNTER — Ambulatory Visit (INDEPENDENT_AMBULATORY_CARE_PROVIDER_SITE_OTHER): Payer: BLUE CROSS/BLUE SHIELD | Admitting: Certified Nurse Midwife

## 2017-03-02 VITALS — BP 114/80 | HR 68 | Temp 97.4°F | Resp 16 | Ht 67.0 in | Wt 174.0 lb

## 2017-03-02 DIAGNOSIS — N951 Menopausal and female climacteric states: Secondary | ICD-10-CM | POA: Diagnosis not present

## 2017-03-02 DIAGNOSIS — N390 Urinary tract infection, site not specified: Secondary | ICD-10-CM | POA: Diagnosis not present

## 2017-03-02 LAB — POCT URINALYSIS DIPSTICK
Bilirubin, UA: NEGATIVE
Blood, UA: NEGATIVE
Glucose, UA: NEGATIVE
Ketones, UA: NEGATIVE
Leukocytes, UA: NEGATIVE
Nitrite, UA: NEGATIVE
Protein, UA: NEGATIVE
Urobilinogen, UA: NEGATIVE E.U./dL — AB
pH, UA: 5 (ref 5.0–8.0)

## 2017-03-02 NOTE — Patient Instructions (Signed)

## 2017-03-02 NOTE — Progress Notes (Signed)
51 y.o. Married Caucasian female G2P2002 here with complaint of UTI, with onset  on 3 days ago.. Patient complaining of slight discomfort when urine touches skin.. Patient denies fever, chills, nausea, urinary frequency or urgency.Slight  back pain, but being treated for. No new personal products. Patient feels not related to sexual activity. Denies any vaginal symptoms of itching, burning or vaginal discharge.    . Menopausal with vaginal dryness. Patient drinking  adequate water intake. Limited caffeine intake. No other health concerns today.  ROS pertinent to HPI  O: Healthy female WDWN Affect: Normal, orientation x 3 Skin : warm and dry CVAT: negative bilateral Abdomen: negative for suprapubic tenderness  Pelvic exam: External genital area: normal, no lesions, dryness noted on internal vulva area bilateral, slightly tender, increase pink in color, no exudate or scaling. Bladder,Urethra non  tender, Urethral meatus: non tender, no redness Vagina: normal vaginal discharge, vaginal dryness noted at introitus  Wet prep not taken Cervix: normal, non tender Uterus:normal,non tender Adnexa: normal non tender, no fullness or masses   A:Normal pelvic exam Post menopausal dryness poct urine-neg R/O UTI  P: Reviewed findings of normal pelvic exam and post menopausal dryness. No evidence of UTI. Discussed etiology of dryness and discussed trial of coconut oil for moisture daily and for sexual activity. Discussed can feel similar to urinary issues when not present.. Questions addressed.. KKD:PTELM  Culture will treat if  indicated Reviewed warning signs and symptoms of UTI and need to advise if occurring. Encouraged to limit soda, tea, and coffee and be sure to increase water intake.   RV prn or if  No change

## 2017-03-04 LAB — URINE CULTURE

## 2017-03-10 NOTE — Progress Notes (Signed)
Encounter reviewed Janice Klimas, MD   

## 2017-04-11 DIAGNOSIS — L298 Other pruritus: Secondary | ICD-10-CM | POA: Diagnosis not present

## 2017-04-11 DIAGNOSIS — L570 Actinic keratosis: Secondary | ICD-10-CM | POA: Diagnosis not present

## 2017-05-03 DIAGNOSIS — L918 Other hypertrophic disorders of the skin: Secondary | ICD-10-CM | POA: Diagnosis not present

## 2017-05-03 DIAGNOSIS — L821 Other seborrheic keratosis: Secondary | ICD-10-CM | POA: Diagnosis not present

## 2017-05-28 ENCOUNTER — Encounter: Payer: Self-pay | Admitting: Obstetrics and Gynecology

## 2017-05-28 ENCOUNTER — Telehealth: Payer: Self-pay | Admitting: Obstetrics and Gynecology

## 2017-05-28 ENCOUNTER — Ambulatory Visit (INDEPENDENT_AMBULATORY_CARE_PROVIDER_SITE_OTHER): Payer: BLUE CROSS/BLUE SHIELD | Admitting: Obstetrics and Gynecology

## 2017-05-28 ENCOUNTER — Other Ambulatory Visit (HOSPITAL_COMMUNITY)
Admission: RE | Admit: 2017-05-28 | Discharge: 2017-05-28 | Disposition: A | Discharge status: Home / Self Care | Payer: BLUE CROSS/BLUE SHIELD | Source: Ambulatory Visit | Attending: Obstetrics and Gynecology | Admitting: Obstetrics and Gynecology

## 2017-05-28 VITALS — BP 118/70 | HR 88 | Resp 16 | Wt 172.0 lb

## 2017-05-28 DIAGNOSIS — N939 Abnormal uterine and vaginal bleeding, unspecified: Secondary | ICD-10-CM

## 2017-05-28 DIAGNOSIS — N898 Other specified noninflammatory disorders of vagina: Secondary | ICD-10-CM | POA: Diagnosis not present

## 2017-05-28 DIAGNOSIS — N644 Mastodynia: Secondary | ICD-10-CM

## 2017-05-28 DIAGNOSIS — N950 Postmenopausal bleeding: Secondary | ICD-10-CM | POA: Diagnosis not present

## 2017-05-28 DIAGNOSIS — R87610 Atypical squamous cells of undetermined significance on cytologic smear of cervix (ASC-US): Secondary | ICD-10-CM | POA: Diagnosis not present

## 2017-05-28 LAB — POCT URINALYSIS DIPSTICK
Bilirubin, UA: NEGATIVE
Glucose, UA: NEGATIVE
Ketones, UA: NEGATIVE
Nitrite, UA: NEGATIVE
Protein, UA: NEGATIVE
Urobilinogen, UA: 0.2 E.U./dL
pH, UA: 5 (ref 5.0–8.0)

## 2017-05-28 NOTE — Telephone Encounter (Signed)
Spoke with patient. Patient states 3 weeks ago she treated what she believes was yeast with OTC monistat 3 day with no relief. Last 3 months has experienced increased hair loss and intermittent "vaginal region" shooting pain. Reports lower back pain and vaginal bleeding that started over the weekend, is concerned with history of breast cancer, being post menopausal and discontinuing tomoxifen 6 months ago. Recommended OV for further evaluation, patient scheduled for today at 2:45pm with Dr. Quincy Simmonds. Patient is agreeable to date and time.  Routing to provider for final review. Patient is agreeable to disposition. Will close encounter.

## 2017-05-28 NOTE — Patient Instructions (Signed)
Postmenopausal Bleeding Postmenopausal bleeding is any bleeding a woman has after she has entered into menopause. Menopause is the end of a woman's fertile years. After menopause, a woman no longer ovulates or has menstrual periods. Postmenopausal bleeding can be caused by various things. Any type of postmenopausal bleeding, even if it appears to be a typical menstrual period, is concerning. This should be evaluated by your health care provider. Any treatment will depend on the cause of the bleeding. Follow these instructions at home: Monitor your condition for any changes. The following actions may help to alleviate any discomfort you are experiencing:  Avoid the use of tampons and douches as directed by your health care provider.  Change your pads frequently.  Get regular pelvic exams and Pap tests.  Keep all follow-up appointments for diagnostic tests as directed by your health care provider.  Contact a health care provider if:  Your bleeding lasts more than 1 week.  You have abdominal pain.  You have bleeding with sexual intercourse. Get help right away if:  You have a fever, chills, headache, dizziness, muscle aches, and bleeding.  You have severe pain with bleeding.  You are passing blood clots.  You have bleeding and need more than 1 pad an hour.  You feel faint. This information is not intended to replace advice given to you by your health care provider. Make sure you discuss any questions you have with your health care provider. Document Released: 02/14/2006 Document Revised: 04/13/2016 Document Reviewed: 06/05/2013 Elsevier Interactive Patient Education  [employer].

## 2017-05-28 NOTE — Progress Notes (Signed)
GYNECOLOGY  VISIT   HPI: 51 y.o.   Married  Caucasian  female   G2P2002 with Patient's last menstrual period was 03/20/2006.   here for PMB with pain/vaginal irritation/back pain.  Noted some pink spotting that became more red in color for 2 days.  Lots of low back pain which did increase.  Some cramping.   Hx endometrial polyp.  Hx breast cancer.  Off Tamoxifen for 6 months.   Having discharge from vagina.  Did a 3 night tx of Monistat.   Patient having bloating and diarrhea.  Hx Crohn's.  Also having back pain.  Hx of low back pain and prior injections and PT.   Patient is also having right breast lump. Thinks this is near her scar tissue.   Urine dip - large RBCs and mod WBCs.  Having vaginal bleeding.   GYNECOLOGIC HISTORY: Patient's last menstrual period was 03/20/2006. Contraception:  postmenopausal Menopausal hormone therapy:  none Last mammogram:  08/09/16 BIRADS1 negative Last pap smear:   08/25/15 Pap and HR HPV negative Urine: 3+ RBC, 2+ WBC     OB History    Gravida Para Term Preterm AB Living   2 2 2     2    SAB TAB Ectopic Multiple Live Births                     Patient Active Problem List   Diagnosis Date Noted  . Breast cancer, right breast ([place]) 03/08/2015  . Dysuria 03/04/2014    Past Medical History:  Diagnosis Date  . Crohn's disease ([place]) 11/21/2003  . Genital warts    as a teenager and were frozen and never returned  . History of breast cancer 03/13/2006    Past Surgical History:  Procedure Laterality Date  . BREAST SURGERY  2007   Rt.lumpectomy--Br. CA, lymph node dissection  1 Pos., Chemo, RX, Tamoxifen  . CHOLECYSTECTOMY    . COLON RESECTION  2004   d/t crohns dz--Dr. Excell Seltzer  . ENDOMETRIAL BIOPSY     11/2009 benign endometrial polyp; 06/2011 wnl  . HYSTEROSCOPY  12-09-2009   polyp resection and D &C --Dr. Joan Flores    Current Outpatient Prescriptions  Medication Sig Dispense Refill  . BIOTIN PO Take by mouth 3 (three) times  a week.    . Cholecalciferol (VITAMIN D PO) Take by mouth daily.    . cholecalciferol (VITAMIN D) 400 UNITS TABS Take 5,000 Units by mouth daily.    Marland Kitchen glucosamine-chondroitin 500-400 MG tablet Take 1 tablet by mouth daily.    . Multiple Vitamin (MULTIVITAMIN) tablet Take 1 tablet by mouth daily.    Marland Kitchen tiZANidine (ZANAFLEX) 4 MG tablet as needed.  1   No current facility-administered medications for this visit.      ALLERGIES: Percocet [oxycodone-acetaminophen]  Family History  Problem Relation Age of Onset  . Heart attack Father   . Hypertension Maternal Grandmother     Social History   Social History  . Marital status: Married    Spouse name: [name]  . Number of children: [name]  . Years of education: [name]   Occupational History  . Not on file.   Social History Main Topics  . Smoking status: Never Smoker  . Smokeless tobacco: Never Used  . Alcohol use No  . Drug use: No  . Sexual activity: Yes    Partners: Male    Birth control/ protection: Other-see comments     Comment: husband with vasectomy   Other  Topics Concern  . Not on file   Social History Narrative  . No narrative on file    ROS:  Pertinent items are noted in HPI.  PHYSICAL EXAMINATION:    BP 118/70 (BP Location: Right Arm, Patient Position: Sitting, Cuff Size: Normal)   Pulse 88   Resp 16   Wt 172 lb (78 kg)   LMP 03/20/2006   BMI 26.94 kg/m     General appearance: alert, cooperative and appears stated age   Breasts: scar of right lateral breast - normal appearance, no masses or tenderness, No nipple retraction or dimpling, No nipple discharge or bleeding, No axillary or supraclavicular adenopathy.  Left breast with no masses, retractions nipple discharge or axillary adenopathy.    Pelvic: External genitalia:  no lesions              Urethra:  normal appearing urethra with no masses, tenderness or lesions              Bartholins and Skenes: normal                 Vagina: normal appearing vagina with  normal color and discharge, no lesions.  Red mucousy drainage from the os.              Cervix:  Whitish area at 12:00 wipes away with pap spatula.                Bimanual Exam:  Uterus:  normal size, contour, position, consistency, mobility, non-tender              Adnexa: no mass, fullness, tenderness         Chaperone was present for exam.  ASSESSMENT  Postmenopausal bleeding.  Hx prior hysteroscopic polypectomy.  Vaginal discharge.  Postmenopausal bleeding.  Right breast pain.  Hx right breast cancer.  Status post Tamoxifen use.  PLAN  Discussion of postmenopausal bleeding and potential etiologies - atrophy, polyp, hyperplasia, malignancy. Pap and HR HPV taken.  Affirm done.  Will return for sonohysterogram and EMB on 05/31/17.  Diagnostic mammogram and right breast US.  She is almost due for evaluation on the left side as well for her routine screening.    An After Visit Summary was printed and given to the patient.  __15____ minutes face to face time of which over 50% was spent in counseling.

## 2017-05-28 NOTE — Telephone Encounter (Signed)
Patient called and said she has been having trouble with a possible yeast infection that won't go away for the last few weeks. She further said that this weekend she has had vaginal bleeding since Saturday and she is post menopausal.  Last seen: 03/02/17

## 2017-05-28 NOTE — Progress Notes (Signed)
Scheduled patient while in office for right breast diagnostic imaging with ultrasound at the [facility] for 05/30/2017 at 9:30 am. Patient is agreeable to date and time. Placed in mammogram hold. Scheduled patient for [facility] and EMB with Dr.Silva on 05/31/2017 at 3 pm.

## 2017-05-29 ENCOUNTER — Telehealth: Payer: Self-pay | Admitting: Obstetrics and Gynecology

## 2017-05-29 NOTE — Telephone Encounter (Signed)
Call placed to patient to review benefits for a scheduled procedure. Left voicemail requesting a return call.

## 2017-05-29 NOTE — Telephone Encounter (Signed)
Patient returning call.

## 2017-05-30 ENCOUNTER — Ambulatory Visit
Admission: RE | Admit: 2017-05-30 | Discharge: 2017-05-30 | Disposition: A | Discharge status: Home / Self Care | Payer: BLUE CROSS/BLUE SHIELD | Source: Ambulatory Visit | Attending: Obstetrics and Gynecology | Admitting: Obstetrics and Gynecology

## 2017-05-30 ENCOUNTER — Other Ambulatory Visit: Payer: Self-pay | Admitting: *Deleted

## 2017-05-30 DIAGNOSIS — N644 Mastodynia: Secondary | ICD-10-CM

## 2017-05-30 DIAGNOSIS — R928 Other abnormal and inconclusive findings on diagnostic imaging of breast: Secondary | ICD-10-CM | POA: Diagnosis not present

## 2017-05-30 DIAGNOSIS — N6313 Unspecified lump in the right breast, lower outer quadrant: Secondary | ICD-10-CM | POA: Diagnosis not present

## 2017-05-30 HISTORY — DX: Personal history of irradiation: Z92.3

## 2017-05-30 LAB — VAGINITIS/VAGINOSIS, DNA PROBE
Candida Species: NEGATIVE
Gardnerella vaginalis: POSITIVE — AB
Trichomonas vaginosis: NEGATIVE

## 2017-05-30 LAB — CYTOLOGY - PAP
Diagnosis: UNDETERMINED — AB
HPV: NOT DETECTED

## 2017-05-30 MED ORDER — METRONIDAZOLE 500 MG PO TABS: 500.0000 mg | ORAL_TABLET | Freq: Two times a day (BID) | ORAL | 0 refills | Status: DC

## 2017-05-30 NOTE — Telephone Encounter (Signed)
Spoke with patient regarding benefit for sonohysterogram and endometrial biopsy. Patient understood and agreeable. Patient scheduled 05/31/17 with Dr Quincy Simmonds. Patient  aware of date, arrival time and policy. No further questions. Ok to close

## 2017-05-31 ENCOUNTER — Other Ambulatory Visit: Payer: Self-pay | Admitting: Obstetrics and Gynecology

## 2017-05-31 ENCOUNTER — Encounter: Payer: Self-pay | Admitting: Obstetrics and Gynecology

## 2017-05-31 ENCOUNTER — Ambulatory Visit (INDEPENDENT_AMBULATORY_CARE_PROVIDER_SITE_OTHER): Payer: BLUE CROSS/BLUE SHIELD | Admitting: Obstetrics and Gynecology

## 2017-05-31 ENCOUNTER — Ambulatory Visit (INDEPENDENT_AMBULATORY_CARE_PROVIDER_SITE_OTHER): Payer: BLUE CROSS/BLUE SHIELD

## 2017-05-31 VITALS — BP 110/70 | HR 80 | Resp 16 | Wt 172.0 lb

## 2017-05-31 DIAGNOSIS — R938 Abnormal findings on diagnostic imaging of other specified body structures: Secondary | ICD-10-CM

## 2017-05-31 DIAGNOSIS — R9389 Abnormal findings on diagnostic imaging of other specified body structures: Secondary | ICD-10-CM

## 2017-05-31 DIAGNOSIS — R87610 Atypical squamous cells of undetermined significance on cytologic smear of cervix (ASC-US): Secondary | ICD-10-CM | POA: Diagnosis not present

## 2017-05-31 DIAGNOSIS — N950 Postmenopausal bleeding: Secondary | ICD-10-CM | POA: Diagnosis not present

## 2017-05-31 DIAGNOSIS — N858 Other specified noninflammatory disorders of uterus: Secondary | ICD-10-CM | POA: Diagnosis not present

## 2017-05-31 NOTE — Patient Instructions (Signed)

## 2017-05-31 NOTE — Progress Notes (Signed)
Encounter reviewed by Dr. Brook [place] C. Silva.  

## 2017-05-31 NOTE — Progress Notes (Signed)
GYNECOLOGY  VISIT   HPI: 51 y.o.   Married  Caucasian  female   G2P2002 with Patient's last menstrual period was 03/20/2006.   here for   Postmenopausal bleeding.   LMP was 2007.   Hx breast cancer and Tamoxifen use that stopped 6 mo ago.   GYNECOLOGIC HISTORY: Patient's last menstrual period was 03/20/2006. Contraception:  postmenopausal Menopausal hormone therapy:  none Last mammogram:  08/09/16 BIRADS1 negative.  Dx right mammogram and right breast US normal 05/30/17.  This was done for right breast pain. Last pap smear:   05/28/17 ASCUS and HR HPV negative   08/25/15 Pap and HR HPV negative        OB History    Gravida Para Term Preterm AB Living   2 2 2     2    SAB TAB Ectopic Multiple Live Births                     Patient Active Problem List   Diagnosis Date Noted  . Breast cancer, right breast ([place]) 03/08/2015  . Dysuria 03/04/2014    Past Medical History:  Diagnosis Date  . Crohn's disease ([place]) 11/21/2003  . Genital warts    as a teenager and were frozen and never returned  . History of breast cancer 03/13/2006  . Personal history of radiation therapy     Past Surgical History:  Procedure Laterality Date  . BREAST LUMPECTOMY Right 2007  . BREAST SURGERY  2007   Rt.lumpectomy--Br. CA, lymph node dissection  1 Pos., Chemo, RX, Tamoxifen  . CHOLECYSTECTOMY    . COLON RESECTION  2004   d/t crohns dz--Dr. Excell Seltzer  . ENDOMETRIAL BIOPSY     11/2009 benign endometrial polyp; 06/2011 wnl  . HYSTEROSCOPY  12-09-2009   polyp resection and D &C --Dr. Joan Flores    Current Outpatient Prescriptions  Medication Sig Dispense Refill  . BIOTIN PO Take by mouth 3 (three) times a week.    . Cholecalciferol (VITAMIN D PO) Take by mouth daily.    . cholecalciferol (VITAMIN D) 400 UNITS TABS Take 5,000 Units by mouth daily.    Marland Kitchen glucosamine-chondroitin 500-400 MG tablet Take 1 tablet by mouth daily.    . metroNIDAZOLE (FLAGYL) 500 MG tablet Take 1 tablet (500 mg total) by mouth  2 (two) times daily. 14 tablet 0  . Multiple Vitamin (MULTIVITAMIN) tablet Take 1 tablet by mouth daily.    Marland Kitchen tiZANidine (ZANAFLEX) 4 MG tablet as needed.  1   No current facility-administered medications for this visit.      ALLERGIES: Percocet [oxycodone-acetaminophen]  Family History  Problem Relation Age of Onset  . Heart attack Father   . Hypertension Maternal Grandmother     Social History   Social History  . Marital status: Married    Spouse name: [name]  . Number of children: [name]  . Years of education: [name]   Occupational History  . Not on file.   Social History Main Topics  . Smoking status: Never Smoker  . Smokeless tobacco: Never Used  . Alcohol use No  . Drug use: No  . Sexual activity: Yes    Partners: Male    Birth control/ protection: Other-see comments     Comment: husband with vasectomy   Other Topics Concern  . Not on file   Social History Narrative  . No narrative on file    ROS:  Pertinent items are noted in HPI.  PHYSICAL EXAMINATION:  BP 110/70 (BP Location: Right Arm, Patient Position: Sitting, Cuff Size: Normal)   Pulse 80   Resp 16   Wt 172 lb (78 kg)   LMP 03/20/2006   BMI 26.94 kg/m     General appearance: alert, cooperative and appears stated age   Pelvic ultrasound/Sonohysterogram/EMB Consent for procedures. Ultrasound:   Normal myometrium.      EMS 21.79 mm.  Bilateral ovaries with small follicle each ovary.  No free fluid.   Sonohysterogram Sterile prep with Hibiclens. Tenaculum to ant cervical lip.  3 filling defects:  12, 13, and 16 mm.  EMB Sterile prep with Hibiclens.  Tenaculum to anterior cervical lip.  Pipelle passed to 8 mm twice.  Tissue to pathology.  No complications.  Minimal EBL.   Chaperone was present for exam.  ASSESSMENT  Postmenopausal bleeding.  Thickened endometrium. Status post Tamoxifen.  Hx breast cancer.  Recent ASCUS pap and negative HR HPV. Status post laparoscopic  cholecystectomy. Hx of partial colectomy for Crohn's.  PLAN  Follow up EMB.  Instructions/precautions given. Discussion of postmenopausal bleeding and potential Tamoxifen effect on the uterine lining including hyperplasia and malignancy.  Discussion of ASCUS pap and potential relationship to postmenopausal bleeding.  As she is not a candidate for hormonal therapy, we may be planning a hysteroscopy with dilation and curettage versus a laparoscopic hysterectomy with BSO if she has benign disease.  Discussion of each and ACOG HOs given.  We discussed referral to GYN ONC if malignancy confirmed.  An After Visit Summary was printed and given to the patient.  __40___ minutes face to face time of which over 50% was spent in counseling.

## 2017-06-01 ENCOUNTER — Encounter: Payer: Self-pay | Admitting: Obstetrics and Gynecology

## 2017-06-06 ENCOUNTER — Telehealth: Payer: Self-pay | Admitting: Obstetrics and Gynecology

## 2017-06-06 NOTE — Telephone Encounter (Signed)
Spoke with patient. Patient reports she had PUS and EMB on 7/12,vaginal bleeding has continued to increase since. Reports changing regular pad 4 times per day. Denies fever, chills, odor, urinary complaints. Reports abdomen tender from biopsy and bleeding. Patient also asking if results are available from biopsy? Advised patient would review with Dr. Quincy Simmonds and return call with recommendations, patient is agreeable.    Dr. Quincy Simmonds - Can you review EMB results and advise?

## 2017-06-06 NOTE — Telephone Encounter (Signed)
Patient is bleeding more after having her biopsy.

## 2017-06-06 NOTE — Telephone Encounter (Signed)
Spoke with patient, advised as seen below per Dr. Quincy Simmonds. Patient scheduled for OV on 7/19 at 3:30pm with Dr. Quincy Simmonds. Patient states she believes she is changing her pad 6 times per day, bleeding is not "gushing".  As far as bleeding what should I do since it seems to be increasing? Advised patient to monitor bleeidng. Advised should bleeding increase to saturating and changing pad q1-2 hours, return call to office or seek care at [facility] ER if after hours. Patient states she has discussed hysterectomy in past with Dr. Quincy Simmonds, would this option be discussed? Advised patient Dr. Quincy Simmonds will provide recommendations and review at [facility] tomorrow. Patient states she plans to bring spouse. Patient verbalizes understanding and is agreeable.   Routing to provider for final review. Patient is agreeable to disposition. Will close encounter.

## 2017-06-06 NOTE — Telephone Encounter (Signed)
Good news! There is no sign of cancer.  The biopsy showed secretory change which I was not expecting as she is postmenopausal. This is a change that is seen more often when women are premenopausal.  I would like for her to return to do hormonal testing and planning for how to stop the bleeding.

## 2017-06-07 ENCOUNTER — Encounter: Payer: Self-pay | Admitting: Obstetrics and Gynecology

## 2017-06-07 ENCOUNTER — Ambulatory Visit (INDEPENDENT_AMBULATORY_CARE_PROVIDER_SITE_OTHER): Payer: BLUE CROSS/BLUE SHIELD | Admitting: Obstetrics and Gynecology

## 2017-06-07 VITALS — BP 130/80 | HR 94 | Resp 14 | Wt 169.2 lb

## 2017-06-07 DIAGNOSIS — N950 Postmenopausal bleeding: Secondary | ICD-10-CM

## 2017-06-07 HISTORY — DX: Postmenopausal bleeding: N95.0

## 2017-06-07 NOTE — Progress Notes (Signed)
GYNECOLOGY  VISIT   HPI: 51 y.o.   Married  Caucasian  female   G2P2002 with Patient's last menstrual period was 03/20/2006.   here for   Post menopausal bleeding.   Husband present for the discussion portion of the visit today. They are worried about cancer but also do not want to over-react.  Bleeding started on 05/26/18.  Pad change every 2 - 3 hours.  Some lightheadedness.   [place] 05/31/17 showed EMS 21.79 mm. Sonohysterogram showed filling defects 12, 13, 16 mm.  Clot versus derbis noted. Recent EMB showed secretory effect.   After finishing her chemotherapy for her estrogen sensitive breast cancer, she saw a naturopath and was treated with progesterone drops for several yeast.  Last used this 5 - 6 times this entire year.  Not taken it regularly for over a year.  No Tamoxifen for about 6 mo.  She had chemotherapy induced menopause.   Every now and then has a touch of pink blood with wiping after sex.  No spontaneous bleeding other than recently.   GYNECOLOGIC HISTORY: Patient's last menstrual period was 03/20/2006. Contraception:  postmenopausal Menopausal hormone therapy:  none Last mammogram:  05/30/17 BIRADS 2 benign/ density cat b   Last pap smear:  05/28/17 ASCUS, HR HPV negative, 08/25/15 negative, HR HPV negative         OB History    Gravida Para Term Preterm AB Living   2 2 2     2    SAB TAB Ectopic Multiple Live Births                     Patient Active Problem List   Diagnosis Date Noted  . Breast cancer, right breast ([place]) 03/08/2015  . Dysuria 03/04/2014    Past Medical History:  Diagnosis Date  . Atypical squamous cell changes of undetermined significance (ASCUS) on cervical cytology with negative high risk human papilloma virus (HPV) test result 2018  . Crohn's disease ([place]) 11/21/2003  . Genital warts    as a teenager and were frozen and never returned  . History of breast cancer 03/13/2006  . Personal history of radiation therapy     Past  Surgical History:  Procedure Laterality Date  . BREAST LUMPECTOMY Right 2007  . BREAST SURGERY  2007   Rt.lumpectomy--Br. CA, lymph node dissection  1 Pos., Chemo, RX, Tamoxifen  . CHOLECYSTECTOMY    . COLON RESECTION  2004   d/t crohns dz--Dr. Excell Seltzer  . ENDOMETRIAL BIOPSY     11/2009 benign endometrial polyp; 06/2011 wnl  . HYSTEROSCOPY  12-09-2009   polyp resection and D &C --Dr. Joan Flores    Current Outpatient Prescriptions  Medication Sig Dispense Refill  . BIOTIN PO Take by mouth 3 (three) times a week.    . Cholecalciferol (VITAMIN D PO) Take by mouth daily.    . cholecalciferol (VITAMIN D) 400 UNITS TABS Take 5,000 Units by mouth daily.    Marland Kitchen glucosamine-chondroitin 500-400 MG tablet Take 1 tablet by mouth daily.    . metroNIDAZOLE (FLAGYL) 500 MG tablet Take 1 tablet (500 mg total) by mouth 2 (two) times daily. 14 tablet 0  . Multiple Vitamin (MULTIVITAMIN) tablet Take 1 tablet by mouth daily.    Marland Kitchen tiZANidine (ZANAFLEX) 4 MG tablet as needed.  1   No current facility-administered medications for this visit.      ALLERGIES: Percocet [oxycodone-acetaminophen]  Family History  Problem Relation Age of Onset  . Heart attack Father   .  Hypertension Maternal Grandmother     Social History   Social History  . Marital status: Married    Spouse name: [name]  . Number of children: [name]  . Years of education: [name]   Occupational History  . Not on file.   Social History Main Topics  . Smoking status: Never Smoker  . Smokeless tobacco: Never Used  . Alcohol use No  . Drug use: No  . Sexual activity: Yes    Partners: Male    Birth control/ protection: Other-see comments     Comment: husband with vasectomy   Other Topics Concern  . Not on file   Social History Narrative  . No narrative on file    ROS:  Pertinent items are noted in HPI.  PHYSICAL EXAMINATION:    BP 130/80 (BP Location: Right Arm, Patient Position: Sitting, Cuff Size: Normal)   Pulse 94   Resp 14    Wt 169 lb 4 oz (76.8 kg)   LMP 03/20/2006   BMI 26.51 kg/m     General appearance: alert, cooperative and appears stated age    Pelvic: External genitalia:  no lesions              Urethra:  normal appearing urethra with no masses, tenderness or lesions              Bartholins and Skenes: normal                 Vagina: normal appearing vagina with normal color and discharge, no lesions              Cervix: no lesions.  Blood in vagina but no active clotting from the cervix.                Bimanual Exam:  Uterus:  normal size, contour, position, consistency, mobility, non-tender              Adnexa: no mass, fullness, tenderness            Chaperone was present for exam.  ASSESSMENT  Postmenopausal bleeding. Hx estrogen receptor sensitive breast cancer. Chemotherapy induced menopause.  Secretory effect on EMB.  Ovulation?  Hormonal supplementation? Hx partial colon resection.   PLAN  We had a comprehensive discussion regarding her clinical picture and potential treatment options.  I want to check her hormonal status to try to understand better the cause of her bleeding.  We did discuss hysteroscopy with dilation and curettage versus hysterectomy with BSO. CBC, FSH, estradiol, CA125.   An After Visit Summary was printed and given to the patient.  ___25___ minutes face to face time of which over 50% was spent in counseling.

## 2017-06-08 ENCOUNTER — Telehealth: Payer: Self-pay | Admitting: Obstetrics and Gynecology

## 2017-06-08 LAB — FOLLICLE STIMULATING HORMONE: FSH: 18.2 m[IU]/mL

## 2017-06-08 LAB — CA 125: Cancer Antigen (CA) 125: 32.2 U/mL (ref 0.0–38.1)

## 2017-06-08 LAB — CBC
Hematocrit: 40 % (ref 34.0–46.6)
Hemoglobin: 13.3 g/dL (ref 11.1–15.9)
MCH: 31.5 pg (ref 26.6–33.0)
MCHC: 33.3 g/dL (ref 31.5–35.7)
MCV: 95 fL (ref 79–97)
Platelets: 209 10*3/uL (ref 150–379)
RBC: 4.22 x10E6/uL (ref 3.77–5.28)
RDW: 13.6 % (ref 12.3–15.4)
WBC: 5.5 10*3/uL (ref 3.4–10.8)

## 2017-06-08 LAB — ESTRADIOL: Estradiol: 7.2 pg/mL

## 2017-06-08 NOTE — Telephone Encounter (Signed)
Call to patient to discuss surgery scheduling options. Plan for 06-14-17 at 0730 at [facility] pending hospital availability. [facility] instruction sheet reviewed and printed copy will be provided with surgery center brochure at consult appointment with Dr Quincy Simmonds. Appointment scheduled for 06-12-17 at 930.  Encounter closed.

## 2017-06-08 NOTE — Telephone Encounter (Signed)
Phone call from the patient.  Husband is also present on speaker phone per patient.   Results reviewed.   She is still bleeding, and I am recommending proceeding forward with hysteroscopy with dilation and curettage at [facility] surgery center next week.  Patient is in agreement.   The office will start to precert and she will return for a brief preop visit next week.

## 2017-06-08 NOTE — Telephone Encounter (Signed)
Phone call to discuss test results.  Left a message that I have called and left no details. I gave her my personal cell phone number and asked her to return the call because the office is now closing for the weekend.  She has had postmenopausal bleeding.  No period for over 10 years until now.  She had chemotherapy induced menopause.  EMB showed secretory change. Her FSH is 18.2 and estradiol 7.2. CA125 32.2. (normal)  If she is continuing to bleed, I am recommending a hysteroscopy with dilation and curettage at [facility] next week on 06/14/17.

## 2017-06-11 ENCOUNTER — Encounter (HOSPITAL_BASED_OUTPATIENT_CLINIC_OR_DEPARTMENT_OTHER): Payer: Self-pay | Admitting: *Deleted

## 2017-06-12 ENCOUNTER — Encounter (HOSPITAL_BASED_OUTPATIENT_CLINIC_OR_DEPARTMENT_OTHER): Payer: Self-pay | Admitting: *Deleted

## 2017-06-12 ENCOUNTER — Ambulatory Visit (INDEPENDENT_AMBULATORY_CARE_PROVIDER_SITE_OTHER): Payer: BLUE CROSS/BLUE SHIELD | Admitting: Obstetrics and Gynecology

## 2017-06-12 ENCOUNTER — Encounter: Payer: Self-pay | Admitting: Obstetrics and Gynecology

## 2017-06-12 VITALS — BP 120/82 | HR 84 | Temp 98.1°F | Resp 18 | Ht 67.0 in | Wt 170.0 lb

## 2017-06-12 DIAGNOSIS — N950 Postmenopausal bleeding: Secondary | ICD-10-CM

## 2017-06-12 NOTE — Progress Notes (Signed)
GYNECOLOGY  VISIT   HPI: 51 y.o.   Married  Caucasian  female   G2P2002 with Patient's last menstrual period was 03/20/2006 (approximate).   here for pre op  DILATATION AND CURETTAGE /HYSTEROSCOPY for 06/14/17.     Had an episode of postmenopausal bleeding after over 10 years of no cycle.  Had chemotherapy induced menopause.   States that the bleeding has diminished significantly.  Her bleeding episode was for 2 weeks.   [place] 05/31/17 showed EMS 21.79 mm, echogenic with cystic spaces. Ovaries with follicular type cysts 12 mm and 9 mm.   Sonohysterogram showed filling defects 12, 13, 16 mm.  Clot versus derbis noted. Recent EMB showed secretory effect.   After finishing her chemotherapy for her estrogen sensitive breast cancer, she saw a naturopath and was treated with progesterone drops for several yeast.  Last used this 5 - 6 times this entire year.  Not taken it regularly for over a year.  No Tamoxifen for about 6 mo.  FSH 18.2 and estradiol 7.2 on 06/07/17. CA125 32.2.  GYNECOLOGIC HISTORY: Patient's last menstrual period was 03/20/2006 (approximate). Contraception:  Post menopausal  Menopausal hormone therapy:  none Last mammogram:  05/30/17 [place] right BIRADS2:benign. Has appt 08/15/17  Last pap smear:   05/28/17 ASCUS. HR HPV:neg         OB History    Gravida Para Term Preterm AB Living   _0 SAB TAB Ectopic Multiple Live Births                     Patient Active Problem List   Diagnosis Date Noted  . Breast cancer, right breast ([place]) 03/08/2015  . Dysuria 03/04/2014    Past Medical History:  Diagnosis Date  . Atypical squamous cell changes of undetermined significance (ASCUS) on cervical cytology with negative high risk human papilloma virus (HPV) test result 2018  . Crohn's disease [facility]) dx 2004  Crohn's ileitis   04-14-2003 resection terminal ileum, cecum, and appendix  . History of breast cancer oncologist-  dr Jana Hakim---  no recurrence (lov 11-29-2016)    dx 04/ 2017 --  Right breast carcinoma , invasive DCIS, Stage IIB, Grade 2 (T1c N1) ER/PR positive, HER-2 negative--  03-12-2006  s/p  right lumpectomy w/ sln bx's and dissection --  adjuvant chemo and radiation therapy completed 01/ 2008-- completed tamoxifen 01/ 2018  . History of cancer chemotherapy completed 01/ 2008   right breast  . History of genital warts    as teenager--  per pt frozen and did not return  . Personal history of radiation therapy completed 01/ 2008   right breast  . PMB (postmenopausal bleeding)     Past Surgical History:  Procedure Laterality Date  . COLONOSCOPY    . D & C HYSTEROSCOPIC RESECTION POLYP  11-29-2009   dr Joan Flores  . LAPAROSCOPIC CHOLECYSTECTOMY  09/16/2002  . PORT-A-CATH REMOVAL  01/03/2007  . RESECTION TERMINAL ILEUM, CECUM, AND APPENDIX  04-14-2003   dr Excell Seltzer   Crohn's ileitis  . RIGHT BREAST LUMPECTOMY W/ MULTIPLE AXILLARY SENTINAL NODE BX'S AND DISSECTION  03/12/2006  . TRANSTHORACIC ECHOCARDIOGRAM  04/05/2006   ef 55-60%/  trivial MR and TR    Current Outpatient Prescriptions  Medication Sig Dispense Refill  . cholecalciferol (VITAMIN D) 400 UNITS TABS Take 5,000 Units by mouth daily.    Marland Kitchen glucosamine-chondroitin 500-400 MG tablet Take 1 tablet by mouth daily.    Marland Kitchen  Multiple Vitamin (MULTIVITAMIN) tablet Take 1 tablet by mouth daily.    Marland Kitchen tiZANidine (ZANAFLEX) 4 MG tablet as needed.  1   No current facility-administered medications for this visit.      ALLERGIES: Oxycodone  Family History  Problem Relation Age of Onset  . Heart attack Father   . Hypertension Maternal Grandmother     Social History   Social History  . Marital status: Married    Spouse name: [name]  . Number of children: [name]  . Years of education: [name]   Occupational History  . Not on file.   Social History Main Topics  . Smoking status: Never Smoker  . Smokeless tobacco: Never Used  . Alcohol use No  . Drug use: No  . Sexual activity: Yes     Partners: Male    Birth control/ protection: Post-menopausal     Comment: husband with vasectomy   Other Topics Concern  . Not on file   Social History Narrative  . No narrative on file    ROS:  Pertinent items are noted in HPI.  PHYSICAL EXAMINATION:    BP 120/82 (BP Location: Left Arm, Patient Position: Sitting, Cuff Size: Normal)   Pulse 84   Temp 98.1 F (36.7 C) (Oral)   Resp 18   Ht _0  (1.702 m)   Wt 170 lb (77.1 kg)   LMP 03/20/2006 (Approximate)   BMI 26.63 kg/m     General appearance: alert, cooperative and appears stated age Head: Normocephalic, without obvious abnormality, atraumatic Neck: no adenopathy, supple, symmetrical, trachea midline and thyroid normal to inspection and palpation Lungs: clear to auscultation bilaterally Heart: regular rate and rhythm Abdomen: soft, non-tender, no masses,  no organomegaly Extremities: extremities normal, atraumatic, no cyanosis or edema Skin: Skin color, texture, turgor normal. No rashes or lesions No abnormal inguinal nodes palpated Neurologic: Grossly normal  Pelvic:  Deferred.   See exam on 05/28/17.   ASSESSMENT  Postmenopausal bleeding.  Thickened endometrium.  Intermittent use of progesterone supplement.  Hx breast cancer and prior use of Tamoxifen.   PLAN  Discussion of hysteroscopy with Myosure, dilation and curettage.  Risks, benefits, and alternatives reviewed. Risks include but are not limited to bleeding, infection, damage to surrounding organs including uterine perforation requiring hospitalization and laparoscopy, pulmonary edema, reaction to anesthesia, DVT, PE, death, need for further treatment and surgery including repeat hysteroscopy, hysterectomy or medical therapy.   The possibility of negative findings also reviewed. Surgical expectations and recovery discussed.  Patient wishes to proceed.  An After Visit Summary was printed and given to the patient.  ___15___ minutes face to face time of  which over 50% was spent in counseling.

## 2017-06-12 NOTE — Progress Notes (Addendum)
To [facility] at 0600-Npo after Mn -urine pregnancy on arrival-CBC to be drawn tomorrow-office called to confirm if needed,last CBC WNL from 06/07/2017.

## 2017-06-13 DIAGNOSIS — Z79899 Other long term (current) drug therapy: Secondary | ICD-10-CM | POA: Diagnosis not present

## 2017-06-13 DIAGNOSIS — Z853 Personal history of malignant neoplasm of breast: Secondary | ICD-10-CM | POA: Diagnosis not present

## 2017-06-13 DIAGNOSIS — Z923 Personal history of irradiation: Secondary | ICD-10-CM | POA: Diagnosis not present

## 2017-06-13 DIAGNOSIS — Z9221 Personal history of antineoplastic chemotherapy: Secondary | ICD-10-CM | POA: Diagnosis not present

## 2017-06-13 DIAGNOSIS — N950 Postmenopausal bleeding: Secondary | ICD-10-CM | POA: Diagnosis not present

## 2017-06-13 LAB — CBC
HCT: 38.3 % (ref 36.0–46.0)
Hemoglobin: 13.1 g/dL (ref 12.0–15.0)
MCH: 31.6 pg (ref 26.0–34.0)
MCHC: 34.2 g/dL (ref 30.0–36.0)
MCV: 92.5 fL (ref 78.0–100.0)
Platelets: 208 10*3/uL (ref 150–400)
RBC: 4.14 MIL/uL (ref 3.87–5.11)
RDW: 12.8 % (ref 11.5–15.5)
WBC: 5.9 10*3/uL (ref 4.0–10.5)

## 2017-06-14 ENCOUNTER — Encounter (HOSPITAL_BASED_OUTPATIENT_CLINIC_OR_DEPARTMENT_OTHER)
Admission: RE | Disposition: A | Discharge status: Home / Self Care | Payer: Self-pay | Source: Ambulatory Visit | Attending: Obstetrics and Gynecology

## 2017-06-14 ENCOUNTER — Ambulatory Visit (HOSPITAL_BASED_OUTPATIENT_CLINIC_OR_DEPARTMENT_OTHER): Payer: BLUE CROSS/BLUE SHIELD | Admitting: Anesthesiology

## 2017-06-14 ENCOUNTER — Ambulatory Visit (HOSPITAL_BASED_OUTPATIENT_CLINIC_OR_DEPARTMENT_OTHER)
Admission: RE | Admit: 2017-06-14 | Discharge: 2017-06-14 | Disposition: A | Discharge status: Home / Self Care | Payer: BLUE CROSS/BLUE SHIELD | Source: Ambulatory Visit | Attending: Obstetrics and Gynecology | Admitting: Obstetrics and Gynecology

## 2017-06-14 ENCOUNTER — Encounter (HOSPITAL_BASED_OUTPATIENT_CLINIC_OR_DEPARTMENT_OTHER): Payer: Self-pay | Admitting: *Deleted

## 2017-06-14 DIAGNOSIS — N950 Postmenopausal bleeding: Secondary | ICD-10-CM | POA: Diagnosis not present

## 2017-06-14 DIAGNOSIS — Z923 Personal history of irradiation: Secondary | ICD-10-CM | POA: Diagnosis not present

## 2017-06-14 DIAGNOSIS — Z79899 Other long term (current) drug therapy: Secondary | ICD-10-CM | POA: Insufficient documentation

## 2017-06-14 DIAGNOSIS — C50911 Malignant neoplasm of unspecified site of right female breast: Secondary | ICD-10-CM | POA: Diagnosis not present

## 2017-06-14 DIAGNOSIS — Z9221 Personal history of antineoplastic chemotherapy: Secondary | ICD-10-CM | POA: Diagnosis not present

## 2017-06-14 DIAGNOSIS — Z853 Personal history of malignant neoplasm of breast: Secondary | ICD-10-CM | POA: Diagnosis not present

## 2017-06-14 DIAGNOSIS — R300 Dysuria: Secondary | ICD-10-CM | POA: Diagnosis not present

## 2017-06-14 HISTORY — DX: Personal history of antineoplastic chemotherapy: Z92.21

## 2017-06-14 HISTORY — DX: Postmenopausal bleeding: N95.0

## 2017-06-14 HISTORY — PX: HYSTEROSCOPY WITH D & C: SHX1775

## 2017-06-14 HISTORY — DX: Personal history of other infectious and parasitic diseases: Z86.19

## 2017-06-14 HISTORY — DX: Malignant (primary) neoplasm, unspecified: C80.1

## 2017-06-14 LAB — POCT PREGNANCY, URINE: Preg Test, Ur: NEGATIVE

## 2017-06-14 SURGERY — DILATATION AND CURETTAGE /HYSTEROSCOPY
Anesthesia: General | Site: Uterus

## 2017-06-14 MED ORDER — LIDOCAINE 2% (20 MG/ML) 5 ML SYRINGE
INTRAMUSCULAR | Status: AC
  Filled 2017-06-14: qty 5

## 2017-06-14 MED ORDER — DEXAMETHASONE SODIUM PHOSPHATE 4 MG/ML IJ SOLN
INTRAMUSCULAR | Status: DC | PRN
  Administered 2017-06-14: 10 mg via INTRAVENOUS

## 2017-06-14 MED ORDER — ARTIFICIAL TEARS OPHTHALMIC OINT
TOPICAL_OINTMENT | OPHTHALMIC | Status: AC
  Filled 2017-06-14: qty 3.5

## 2017-06-14 MED ORDER — ONDANSETRON HCL 4 MG/2ML IJ SOLN
4.0000 mg | Freq: Once | INTRAMUSCULAR | Status: DC | PRN
  Filled 2017-06-14: qty 2

## 2017-06-14 MED ORDER — KETOROLAC TROMETHAMINE 30 MG/ML IJ SOLN
INTRAMUSCULAR | Status: DC | PRN
  Administered 2017-06-14: 30 mg via INTRAVENOUS

## 2017-06-14 MED ORDER — DEXAMETHASONE SODIUM PHOSPHATE 10 MG/ML IJ SOLN
INTRAMUSCULAR | Status: AC
  Filled 2017-06-14: qty 1

## 2017-06-14 MED ORDER — KETOROLAC TROMETHAMINE 30 MG/ML IJ SOLN
INTRAMUSCULAR | Status: AC
  Filled 2017-06-14: qty 1

## 2017-06-14 MED ORDER — MIDAZOLAM HCL 5 MG/5ML IJ SOLN
INTRAMUSCULAR | Status: DC | PRN
  Administered 2017-06-14: 2 mg via INTRAVENOUS

## 2017-06-14 MED ORDER — FENTANYL CITRATE (PF) 100 MCG/2ML IJ SOLN
INTRAMUSCULAR | Status: AC
  Filled 2017-06-14: qty 2

## 2017-06-14 MED ORDER — LIDOCAINE 2% (20 MG/ML) 5 ML SYRINGE
INTRAMUSCULAR | Status: DC | PRN
  Administered 2017-06-14: 40 mg via INTRAVENOUS

## 2017-06-14 MED ORDER — FENTANYL CITRATE (PF) 100 MCG/2ML IJ SOLN
25.0000 ug | INTRAMUSCULAR | Status: DC | PRN
  Administered 2017-06-14: 25 ug via INTRAVENOUS
  Filled 2017-06-14: qty 1

## 2017-06-14 MED ORDER — LIDOCAINE HCL 1 % IJ SOLN
INTRAMUSCULAR | Status: DC | PRN
  Administered 2017-06-14: 20 mL

## 2017-06-14 MED ORDER — MIDAZOLAM HCL 2 MG/2ML IJ SOLN
INTRAMUSCULAR | Status: AC
  Filled 2017-06-14: qty 2

## 2017-06-14 MED ORDER — SODIUM CHLORIDE 0.9 % IR SOLN
Status: DC | PRN
  Administered 2017-06-14: 3000 mL

## 2017-06-14 MED ORDER — LACTATED RINGERS IV SOLN
INTRAVENOUS | Status: DC
  Filled 2017-06-14: qty 1000

## 2017-06-14 MED ORDER — FLUMAZENIL 0.5 MG/5ML IV SOLN
INTRAVENOUS | Status: DC | PRN
  Administered 2017-06-14: .1 mg via INTRAVENOUS
  Administered 2017-06-14: 0.2 mg via INTRAVENOUS

## 2017-06-14 MED ORDER — IBUPROFEN 800 MG PO TABS: 800.0000 mg | ORAL_TABLET | Freq: Three times a day (TID) | ORAL | 0 refills | Status: DC | PRN

## 2017-06-14 MED ORDER — FENTANYL CITRATE (PF) 100 MCG/2ML IJ SOLN
INTRAMUSCULAR | Status: DC | PRN
  Administered 2017-06-14 (×2): 50 ug via INTRAVENOUS

## 2017-06-14 MED ORDER — PROPOFOL 10 MG/ML IV BOLUS
INTRAVENOUS | Status: DC | PRN
  Administered 2017-06-14: 160 mg via INTRAVENOUS

## 2017-06-14 MED ORDER — ONDANSETRON HCL 4 MG/2ML IJ SOLN
INTRAMUSCULAR | Status: AC
  Filled 2017-06-14: qty 2

## 2017-06-14 MED ORDER — ONDANSETRON HCL 4 MG/2ML IJ SOLN
INTRAMUSCULAR | Status: DC | PRN
  Administered 2017-06-14: 4 mg via INTRAVENOUS

## 2017-06-14 MED ORDER — LACTATED RINGERS IV SOLN
INTRAVENOUS | Status: DC
  Administered 2017-06-14: 07:00:00 via INTRAVENOUS
  Filled 2017-06-14: qty 1000

## 2017-06-14 SURGICAL SUPPLY — 19 items
CANISTER SUCT 3000ML PPV (MISCELLANEOUS) ×2 IMPLANT
CATH ROBINSON RED A/P 16FR (CATHETERS) ×2 IMPLANT
CLOTH BEACON ORANGE TIMEOUT ST (SAFETY) ×2 IMPLANT
COUNTER NEEDLE 1200 MAGNETIC (NEEDLE) ×2 IMPLANT
DEVICE MYOSURE LITE (MISCELLANEOUS) IMPLANT
DEVICE MYOSURE REACH (MISCELLANEOUS) IMPLANT
DILATOR CANAL MILEX (MISCELLANEOUS) IMPLANT
FILTER ARTHROSCOPY CONVERTOR (FILTER) ×2 IMPLANT
GLOVE BIO SURGEON STRL SZ 6.5 (GLOVE) ×2 IMPLANT
GOWN STRL REUS W/TWL LRG LVL3 (GOWN DISPOSABLE) ×4 IMPLANT
IV NS IRRIG 3000ML ARTHROMATIC (IV SOLUTION) ×2 IMPLANT
MYOSURE XL FIBROID REM (MISCELLANEOUS)
PACK VAGINAL MINOR WOMEN LF (CUSTOM PROCEDURE TRAY) ×2 IMPLANT
PAD OB MATERNITY 4.3X12.25 (PERSONAL CARE ITEMS) ×2 IMPLANT
SEAL ROD LENS SCOPE MYOSURE (ABLATOR) ×2 IMPLANT
SYSTEM TISS REMOVAL MYSR XL RM (MISCELLANEOUS) IMPLANT
TOWEL OR 17X24 6PK STRL BLUE (TOWEL DISPOSABLE) ×4 IMPLANT
TUBING AQUILEX INFLOW (TUBING) ×2 IMPLANT
TUBING AQUILEX OUTFLOW (TUBING) ×2 IMPLANT

## 2017-06-14 NOTE — Brief Op Note (Signed)
06/14/2017  7:50 AM  PATIENT:  Janice [name]  51 y.o. female  PRE-OPERATIVE DIAGNOSIS:  Postmenopausal bleeding  POST-OPERATIVE DIAGNOSIS:   Postmenopausal bleeding  PROCEDURE:  Procedure(s): DILATATION AND CURETTAGE /HYSTEROSCOPY WITH POSSIBLE MYOSURE. ([name])  SURGEON:  Surgeon(s) and Role:    * Amundson Raliegh Ip, MD - Primary  PHYSICIAN ASSISTANT: NA  ASSISTANTS: none   ANESTHESIA:   paracervical block and LMA  EBL:  Total I/O In: -  Out: 205 [Urine:200; Blood:5]  BLOOD ADMINISTERED:none  DRAINS: none   LOCAL MEDICATIONS USED:  LIDOCAINE  and Amount: 11 ml  SPECIMEN:  Source of Specimen:  Endometrial and endocervical curettings  DISPOSITION OF SPECIMEN:  PATHOLOGY  COUNTS:  YES  TOURNIQUET:  * No tourniquets in log *  DICTATION: .Note written in EPIC  PLAN OF CARE: Discharge to home after PACU  PATIENT DISPOSITION:  PACU - hemodynamically stable.   Delay start of Pharmacological VTE agent (>24hrs) due to surgical blood loss or risk of bleeding: not applicable

## 2017-06-14 NOTE — H&P (Signed)
Office Visit   06/12/2017 [place] Silva, Everardo All, MD  Obstetrics and Gynecology   Postmenopausal bleeding  Dx   Pre-op Exam   Reason for Visit   Additional Documentation   Vitals:   BP 120/82 (BP Location: Left Arm, Patient Position: Sitting, Cuff Size: Normal)   Pulse 84   Temp 98.1 F (36.7 C) (Oral)   Resp 18   Ht 5' 7"  (1.702 m)   Wt 170 lb (77.1 kg)   LMP 03/20/2006 (Approximate)   BMI 26.63 kg/m   BSA 1.91 m      More Vitals   Flowsheets:   Custom Formula Data,   MEWS Score,   Anthropometrics     Encounter Info:   Billing Info,   History,   Allergies,   Detailed Report     All Notes   Progress Notes by Nunzio Cobbs, MD at 06/12/2017 9:30 AM   Author: Nunzio Cobbs, MD Author Type: Physician Filed: 06/12/2017 1:20 PM  Note Status: Signed Cosign: Cosign Not Required Encounter Date: 06/12/2017  Editor: Nunzio Cobbs, MD (Physician)  Prior Versions: 1. Polly Cobia, CMA (Physiological scientist) at 06/12/2017 9:28 AM - Sign at close encounter    GYNECOLOGY  VISIT   HPI: 51 y.o.   Married  Caucasian  female   G2P2002 with Patient's last menstrual period was 03/20/2006 (approximate).   here for pre op  DILATATION AND CURETTAGE /HYSTEROSCOPY for 06/14/17.     Had an episode of postmenopausal bleeding after over 10 years of no cycle.  Had chemotherapy induced menopause.   States that the bleeding has diminished significantly.  Her bleeding episode was for 2 weeks.   [place] 05/31/17 showed EMS 21.79 mm, echogenic with cystic spaces. Ovaries with follicular type cysts 12 mm and 9 mm.   Sonohysterogram showed filling defects 12, 13, 16 mm. Clot versus derbis noted. Recent EMB showed secretory effect.   After finishing her chemotherapy for her estrogen sensitive breast cancer, shesaw a naturopath and was treated with progesterone drops for several yeast.  Last used this 5 - 6  times this entire year.  Not taken it regularly for over a year.  No Tamoxifen for about 6 mo.  FSH 18.2 and estradiol 7.2 on 06/07/17. CA125 32.2.  GYNECOLOGIC HISTORY: Patient's last menstrual period was 03/20/2006 (approximate). Contraception:  Post menopausal  Menopausal hormone therapy:  none Last mammogram:  05/30/17 [place] right BIRADS2:benign. Has appt 08/15/17  Last pap smear:   05/28/17 ASCUS. HR HPV:neg                 OB History    Gravida Para Term Preterm AB Living   2 2 2     2    SAB TAB Ectopic Multiple Live Births                         Patient Active Problem List   Diagnosis Date Noted  . Breast cancer, right breast ([place]) 03/08/2015  . Dysuria 03/04/2014        Past Medical History:  Diagnosis Date  . Atypical squamous cell changes of undetermined significance (ASCUS) on cervical cytology with negative high risk human papilloma virus (HPV) test result 2018  . Crohn's disease [facility]) dx 2004  Crohn's ileitis   04-14-2003 resection terminal ileum, cecum, and appendix  . History of breast cancer oncologist-  dr Jana Hakim---  no recurrence (lov 11-29-2016)   dx 04/ 2017 --  Right breast carcinoma , invasive DCIS, Stage IIB, Grade 2 (T1c N1) ER/PR positive, HER-2 negative--  03-12-2006  s/p  right lumpectomy w/ sln bx's and dissection --  adjuvant chemo and radiation therapy completed 01/ 2008-- completed tamoxifen 01/ 2018  . History of cancer chemotherapy completed 01/ 2008   right breast  . History of genital warts    as teenager--  per pt frozen and did not return  . Personal history of radiation therapy completed 01/ 2008   right breast  . PMB (postmenopausal bleeding)          Past Surgical History:  Procedure Laterality Date  . COLONOSCOPY    . D & C HYSTEROSCOPIC RESECTION POLYP  11-29-2009   dr Joan Flores  . LAPAROSCOPIC CHOLECYSTECTOMY  09/16/2002  . PORT-A-CATH REMOVAL  01/03/2007  . RESECTION TERMINAL ILEUM, CECUM, AND APPENDIX   04-14-2003   dr Excell Seltzer   Crohn's ileitis  . RIGHT BREAST LUMPECTOMY W/ MULTIPLE AXILLARY SENTINAL NODE BX'S AND DISSECTION  03/12/2006  . TRANSTHORACIC ECHOCARDIOGRAM  04/05/2006   ef 55-60%/  trivial MR and TR          Current Outpatient Prescriptions  Medication Sig Dispense Refill  . cholecalciferol (VITAMIN D) 400 UNITS TABS Take 5,000 Units by mouth daily.    Marland Kitchen glucosamine-chondroitin 500-400 MG tablet Take 1 tablet by mouth daily.    . Multiple Vitamin (MULTIVITAMIN) tablet Take 1 tablet by mouth daily.    Marland Kitchen tiZANidine (ZANAFLEX) 4 MG tablet as needed.  1   No current facility-administered medications for this visit.      ALLERGIES: Oxycodone       Family History  Problem Relation Age of Onset  . Heart attack Father   . Hypertension Maternal Grandmother     Social History        Social History  . Marital status: Married    Spouse name: [name]  . Number of children: [name]  . Years of education: [name]      Occupational History  . Not on file.         Social History Main Topics  . Smoking status: Never Smoker  . Smokeless tobacco: Never Used  . Alcohol use No  . Drug use: No  . Sexual activity: Yes    Partners: Male    Birth control/ protection: Post-menopausal     Comment: husband with vasectomy       Other Topics Concern  . Not on file      Social History Narrative  . No narrative on file    ROS:  Pertinent items are noted in HPI.  PHYSICAL EXAMINATION:    BP 120/82 (BP Location: Left Arm, Patient Position: Sitting, Cuff Size: Normal)   Pulse 84   Temp 98.1 F (36.7 C) (Oral)   Resp 18   Ht 5' 7"  (1.702 m)   Wt 170 lb (77.1 kg)   LMP 03/20/2006 (Approximate)   BMI 26.63 kg/m     General appearance: alert, cooperative and appears stated age Head: Normocephalic, without obvious abnormality, atraumatic Neck: no adenopathy, supple, symmetrical, trachea midline and thyroid normal to inspection and  palpation Lungs: clear to auscultation bilaterally Heart: regular rate and rhythm Abdomen: soft, non-tender, no masses,  no organomegaly Extremities: extremities normal, atraumatic, no cyanosis or edema Skin: Skin color, texture, turgor normal. No rashes or lesions No abnormal inguinal nodes palpated Neurologic: Grossly normal  Pelvic:  Deferred.  See exam on 05/28/17.   ASSESSMENT  Postmenopausal bleeding.  Thickened endometrium.  Intermittent use of progesterone supplement.  Hx breast cancer and prior use of Tamoxifen.   PLAN  Discussion of hysteroscopy with Myosure, dilation and curettage.  Risks, benefits, and alternatives reviewed. Risks include but are not limited to bleeding, infection, damage to surrounding organs including uterine perforation requiring hospitalization and laparoscopy, pulmonary edema, reaction to anesthesia, DVT, PE, death, need for further treatment and surgery including repeat hysteroscopy, hysterectomy or medical therapy.   The possibility of negative findings also reviewed. Surgical expectations and recovery discussed.  Patient wishes to proceed.  An After Visit Summary was printed and given to the patient.  ___15___ minutes face to face time of which over 50% was spent in counseling.        Addendum  Having light yellow drainage today.  OK to proceed with surgery.

## 2017-06-14 NOTE — Anesthesia Preprocedure Evaluation (Addendum)

## 2017-06-14 NOTE — Op Note (Signed)
OPERATIVE REPORT   PREOPERATIVE DIAGNOSES:   Postmenopausal bleeding  POSTOPERATIVE DIAGNOSES:   Postmenopausal bleeding    PROCEDURE:  Hysteroscopy with fractional dilation and curettage.  SURGEON:  Lenard Galloway, MD  ANESTHESIA:  LMA, paracervical block with 11 mL of 1% lidocaine.  IV FLUIDS:   600 cc LR.  EBL:  5 cc.  URINE OUTPUT:   150 cc.  NORMAL SALINE DEFICIT:   60 cc.  COMPLICATIONS:  None.  INDICATIONS FOR THE PROCEDURE:     The patient is a 51 year old female with chemotherapy induced menopause who presented with vaginal bleeding more than a decade since her last menstrual period.  Office endometrial biopsy showed secretory effect.  That patient has a history of breast cancer and completed tamoxifen therapy 6 months ago.  She reports use of natural progesterone but no significant use in 6 months.  An FSH measured 18.2 and estradiol 7.2.  A plan is now made to proceed with a hysteroscopy with dilation and curettage and; after risks, benefits and alternatives were reviewed.  FINDINGS:  Exam under anesthesia revealed a small anteverted mobile uterus. The uterus was sounded to 7 cm. Hysteroscopy showed evidence of recent bleeding.  No lesions were seen in the endocervical or endometrial canal.  Both tubal ostia were seen well.  Endometrial and endocervical currettings were scant in amount.   SPECIMENS:  Endometrial and endocervical curettings were sent to Pathology separately.   PROCEDURE IN DETAIL:  The patient was reidentified in the preoperative hold area.  She received PAS stockings for DVT prophylaxis.  In the operating room, the patient was placed in the dorsal lithotomy position and then an LMA anesthetic was introduced.  The patient's lower abdomen, vagina and perineum were sterilely prepped with Betadine and the  patient's bladder was catheterized of urine.  She was sterilly draped  An exam under anesthesia was performed.  A speculum was placed inside  the vagina and a single-tooth tenaculum was placed on the anterior cervical lip.  A paracervical block was performed with a total of 11 mL of 1% lidocaine plain.  The uterus was sounded. The cervix was dilated to a #23 Pratt dilator.  The MyoSure hysteroscope was then inserted inside the uterine cavity under the continuous infusion of normal saline solution.  Findings are as noted above.  The MyoSure hysteroscope was removed. The sharp and then a serrated curette were  introduced into the uterine cavity and the endometrium was curetted in all 4  quadrants.  This specimen was sent to Pathology.  The endocervix was  curetted and sent to pathology as a separate specimen.  The single-tooth tenaculum which had been placed on the anterior cervical lip was removed.  Hemostasis was good, and all of the vaginal  instruments were removed.  The patient was awakened and escorted to the recovery room in stable condition after she was cleansed with Betadine.  There were no complications to the procedure.  All needle, instrument and sponge counts were correct.  Lenard Galloway, MD

## 2017-06-14 NOTE — Anesthesia Procedure Notes (Signed)
Procedure Name: LMA Insertion Performed by: Wanita Chamberlain Pre-anesthesia Checklist: Patient identified, Timeout performed, Emergency Drugs available, Suction available and Patient being monitored Patient Re-evaluated:Patient Re-evaluated prior to induction Oxygen Delivery Method: Circle system utilized Preoxygenation: Pre-oxygenation with 100% oxygen Induction Type: IV induction Ventilation: Mask ventilation without difficulty LMA: LMA inserted LMA Size: 4.0 Number of attempts: 1 Placement Confirmation: positive ETCO2 and breath sounds checked- equal and bilateral Tube secured with: Tape Dental Injury: Teeth and Oropharynx as per pre-operative assessment

## 2017-06-14 NOTE — Transfer of Care (Signed)
Immediate Anesthesia Transfer of Care Note  Patient: Janice [name]  Procedure(s) Performed: Procedure(s): DILATATION AND CURETTAGE /HYSTEROSCOPY ([name])  Patient Location: PACU  Anesthesia Type:General  Level of Consciousness: awake, alert , oriented and patient cooperative  Airway & Oxygen Therapy: Patient Spontanous Breathing and Patient connected to nasal cannula oxygen  Post-op Assessment: Report given to RN and Post -op Vital signs reviewed and stable  Post vital signs: Reviewed and stable  Last Vitals:  Vitals:   06/14/17 0613  BP: 121/73  Pulse: 74  Resp: 18  Temp: 36.6 C    Last Pain:  Vitals:   06/14/17 0613  TempSrc: Oral      Patients Stated Pain Goal: 6 (60/47/99 8721)  Complications: No apparent anesthesia complications

## 2017-06-14 NOTE — Discharge Instructions (Signed)
Hi Analyah,  You did well with surgery today.  I did not see any lesions inside the uterus.  It looked like you had recent bleeding.  I did the curettage of the uterus and of the cervical channel.  I will be in touch when the pathology report is back.   Thank you,   Janice Half, MD  Hysteroscopy, Care After Refer to this sheet in the next few weeks. These instructions provide you with information on caring for yourself after your procedure. Your health care provider may also give you more specific instructions. Your treatment has been planned according to current medical practices, but problems sometimes occur. Call your health care provider if you have any problems or questions after your procedure. What can I expect after the procedure? After your procedure, it is typical to have the following:  You may have some cramping. This normally lasts for a couple days.  You may have bleeding. This can vary from light spotting for a few days to menstrual-like bleeding for 3-7 days.  Follow these instructions at home:  Rest for the first 1-2 days after the procedure.  Only take over-the-counter or prescription medicines as directed by your health care provider. Do not take aspirin. It can increase the chances of bleeding.  Take showers instead of baths for 2 weeks or as directed by your health care provider.  Do not drive for 24 hours or as directed.  Do not drink alcohol while taking pain medicine.  Do not use tampons, douche, or have sexual intercourse for 2 weeks or until your health care provider says it is okay.  Take your temperature twice a day for 4-5 days. Write it down each time.  Follow your health care provider's advice about diet, exercise, and lifting.  If you develop constipation, you may: ? Take a mild laxative if your health care provider approves. ? Add bran foods to your diet. ? Drink enough fluids to keep your urine clear or pale yellow.  Try to have someone with  you or available to you for the first 24-48 hours, especially if you were given a general anesthetic.  Follow up with your health care provider as directed. Contact a health care provider if:  You feel dizzy or lightheaded.  You feel sick to your stomach (nauseous).  You have abnormal vaginal discharge.  You have a rash.  You have pain that is not controlled with medicine. Get help right away if:  You have bleeding that is heavier than a normal menstrual period.  You have a fever.  You have increasing cramps or pain, not controlled with medicine.  You have new belly (abdominal) pain.  You pass out.  You have pain in the tops of your shoulders (shoulder strap areas).  You have shortness of breath. This information is not intended to replace advice given to you by your health care provider. Make sure you discuss any questions you have with your health care provider. Document Released: 08/27/2013 Document Revised: 04/13/2016 Document Reviewed: 06/05/2013 Elsevier Interactive Patient Education  2017 [employer]   Dilation and Curettage or Vacuum Curettage, Care After This sheet gives you information about how to care for yourself after your procedure. Your health care provider may also give you more specific instructions. If you have problems or questions, contact your health care provider. What can I expect after the procedure? After your procedure, it is common to have:  Mild pain or cramping.  Some vaginal bleeding or spotting.  These may last for up to 2 weeks after your procedure. Follow these instructions at home: Activity   Do not drive or use heavy machinery while taking prescription pain medicine.  Avoid driving for the first 24 hours after your procedure.  Take frequent, short walks, followed by rest periods, throughout the day. Ask your health care provider what activities are safe for you. After 1-2 days, you may be able to return to your normal  activities.  Do not lift anything heavier than 10 lb (4.5 kg) until your health care provider approves.  For at least 2 weeks, or as long as told by your health care provider, do not: ? Douche. ? Use tampons. ? Have sexual intercourse. General instructions   Take over-the-counter and prescription medicines only as told by your health care provider. This is especially important if you take blood thinning medicine.  Do not take baths, swim, or use a hot tub until your health care provider approves. Take showers instead of baths.  Wear compression stockings as told by your health care provider. These stockings help to prevent blood clots and reduce swelling in your legs.  It is your responsibility to get the results of your procedure. Ask your health care provider, or the department performing the procedure, when your results will be ready.  Keep all follow-up visits as told by your health care provider. This is important. Contact a health care provider if:  You have severe cramps that get worse or that do not get better with medicine.  You have severe abdominal pain.  You cannot drink fluids without vomiting.  You develop pain in a different area of your pelvis.  You have bad-smelling vaginal discharge.  You have a rash. Get help right away if:  You have vaginal bleeding that soaks more than one sanitary pad in 1 hour, for 2 hours in a row.  You pass large blood clots from your vagina.  You have a fever that is above 100.72F (38.0C).  Your abdomen feels very tender or hard.  You have chest pain.  You have shortness of breath.  You cough up blood.  You feel dizzy or light-headed.  You faint.  You have pain in your neck or shoulder area. This information is not intended to replace advice given to you by your health care provider. Make sure you discuss any questions you have with your health care provider. Document Released: 11/03/2000 Document Revised: 07/05/2016  Document Reviewed: 06/08/2016 Elsevier Interactive Patient Education  2018 [facility] Instructions  Activity: Get plenty of rest for the remainder of the day. A responsible individual must stay with you for 24 hours following the procedure.  For the next 24 hours, DO NOT: -Drive a car -Paediatric nurse -Drink alcoholic beverages -Take any medication unless instructed by your physician -Make any legal decisions or sign important papers.  Meals: Start with liquid foods such as gelatin or soup. Progress to regular foods as tolerated. Avoid greasy, spicy, heavy foods. If nausea and/or vomiting occur, drink only clear liquids until the nausea and/or vomiting subsides. Call your physician if vomiting continues.  Special Instructions/Symptoms: Your throat may feel dry or sore from the anesthesia or the breathing tube placed in your throat during surgery. If this causes discomfort, gargle with warm salt water. The discomfort should disappear within 24 hours.  If you had a scopolamine patch placed behind your ear for the management of post- operative nausea and/or vomiting:  1. The  medication in the patch is effective for 72 hours, after which it should be removed.  Wrap patch in a tissue and discard in the trash. Wash hands thoroughly with soap and water. 2. You may remove the patch earlier than 72 hours if you experience unpleasant side effects which may include dry mouth, dizziness or visual disturbances. 3. Avoid touching the patch. Wash your hands with soap and water after contact with the patch.

## 2017-06-14 NOTE — Anesthesia Postprocedure Evaluation (Signed)
Anesthesia Post Note  Patient: Janice [name]  Procedure(s) Performed: Procedure(s) (LRB): DILATATION AND CURETTAGE /HYSTEROSCOPY ([name])     Patient location during evaluation: PACU Anesthesia Type: General Level of consciousness: awake, awake and alert and oriented Pain management: pain level controlled Vital Signs Assessment: post-procedure vital signs reviewed and stable Respiratory status: spontaneous breathing, nonlabored ventilation and respiratory function stable Cardiovascular status: blood pressure returned to baseline Anesthetic complications: no    Last Vitals:  Vitals:   06/14/17 0830 06/14/17 0845  BP: 115/70 113/72  Pulse: 61 65  Resp: 14 15  Temp:      Last Pain:  Vitals:   06/14/17 0900  TempSrc:   PainSc: 5                  Loren Vicens COKER

## 2017-06-15 ENCOUNTER — Encounter (HOSPITAL_BASED_OUTPATIENT_CLINIC_OR_DEPARTMENT_OTHER): Payer: Self-pay | Admitting: Obstetrics and Gynecology

## 2017-06-28 ENCOUNTER — Encounter: Payer: Self-pay | Admitting: Obstetrics and Gynecology

## 2017-06-28 ENCOUNTER — Ambulatory Visit (INDEPENDENT_AMBULATORY_CARE_PROVIDER_SITE_OTHER): Payer: BLUE CROSS/BLUE SHIELD | Admitting: Obstetrics and Gynecology

## 2017-06-28 VITALS — BP 122/80 | HR 80 | Ht 67.0 in | Wt 171.0 lb

## 2017-06-28 DIAGNOSIS — Z9889 Other specified postprocedural states: Secondary | ICD-10-CM | POA: Diagnosis not present

## 2017-06-28 DIAGNOSIS — R350 Frequency of micturition: Secondary | ICD-10-CM | POA: Diagnosis not present

## 2017-06-28 LAB — POCT URINALYSIS DIPSTICK
Bilirubin, UA: NEGATIVE
Blood, UA: NEGATIVE
Glucose, UA: NEGATIVE
Ketones, UA: NEGATIVE
Leukocytes, UA: NEGATIVE
Nitrite, UA: NEGATIVE
Protein, UA: NEGATIVE
Urobilinogen, UA: 0.2 E.U./dL
pH, UA: 5 (ref 5.0–8.0)

## 2017-06-28 NOTE — Progress Notes (Signed)
GYNECOLOGY  VISIT   HPI: 51 y.o.   Married  Caucasian  female   G2P2002 with Patient's last menstrual period was 03/20/2006 (approximate).   here for 2 week follow up [place] /HYSTEROSCOPY ([name] Uterus).  Surgical pathology - inactive endometrium, no hyperplasia or malignancy.  Not bleeding.  Patient complaining of urinary frequency which is not consistent.    LMP was 11 years ago. Stopped Tamoxifen for breast cancer tx at the beginning of the year.  Was using natural progesterone supplement, but not in the recent past.  Her office endometrial biopsy showed progesterone effect, sonohysterogram showed filling defects, and she continued to have bleeding all of which prompted her surgical care.  Of note, her estrogen level was 7.2 and her FSH was 18.2 on 06/07/17.  Has an appointment with Dr. Jana Hakim in October 2017.   Urine Dip:Neg  GYNECOLOGIC HISTORY: Patient's last menstrual period was 03/20/2006 (approximate). Contraception:  Postmenopausal Menopausal hormone therapy:  none Last mammogram: 05-30-17 Diag.Rt.and Rt.Br.U/Sfor Br.pain/Neg/BiRads2--Pt.had screening MMG 08-15-17. 08-09-16 Density B/Neg/BiRads/TBC Last pap smear:  05/28/17 ASCUS. Neg HR HPV                              08-25-15 Neg:Neg HR HPV        OB History    Gravida Para Term Preterm AB Living   2 2 2     2    SAB TAB Ectopic Multiple Live Births                     Patient Active Problem List   Diagnosis Date Noted  . Breast cancer, right breast ([place]) 03/08/2015  . Dysuria 03/04/2014    Past Medical History:  Diagnosis Date  . Atypical squamous cell changes of undetermined significance (ASCUS) on cervical cytology with negative high risk human papilloma virus (HPV) test result 2018  . Cancer ([place]) 02/22/2006   rt breast cancer  . Crohn's disease [facility]) dx 2004  Crohn's ileitis   04-14-2003 resection terminal ileum, cecum, and appendix  . History of breast cancer oncologist-  dr Jana Hakim---   no recurrence ([place] 11-29-2016)   dx 04/ 2007 --  Right breast carcinoma , invasive DCIS, Stage IIB, Grade 2 (T1c N1) ER/PR positive, HER-2 negative--  03-12-2006  s/p  right lumpectomy w/ sln bx's and dissection --  adjuvant chemo and radiation therapy completed 01/ 2008-- completed tamoxifen 01/ 2018  . History of cancer chemotherapy completed 01/ 2008   right breast  . History of genital warts    as teenager--  per pt frozen and did not return  . Personal history of radiation therapy completed 01/ 2008   right breast  . PMB (postmenopausal bleeding) 06/07/2017    Past Surgical History:  Procedure Laterality Date  . COLONOSCOPY    . D & C HYSTEROSCOPIC RESECTION POLYP  11-29-2009   dr Joan Flores  . HYSTEROSCOPY W/D&C [name] 06/14/2017   Procedure: DILATATION AND CURETTAGE /HYSTEROSCOPY;  Surgeon: Nunzio Cobbs, MD;  Location: [facility];  Service: Gynecology;  Laterality: [name];  . LAPAROSCOPIC CHOLECYSTECTOMY  09/16/2002  . PORT-A-CATH REMOVAL  01/03/2007  . RESECTION TERMINAL ILEUM, CECUM, AND APPENDIX  04-14-2003   dr Excell Seltzer   Crohn's ileitis  . RIGHT BREAST LUMPECTOMY W/ MULTIPLE AXILLARY SENTINAL NODE BX'S AND DISSECTION  03/12/2006  . TRANSTHORACIC ECHOCARDIOGRAM  04/05/2006   ef 55-60%/  trivial MR and TR  Current Outpatient Prescriptions  Medication Sig Dispense Refill  . cholecalciferol (VITAMIN D) 400 UNITS TABS Take 5,000 Units by mouth daily.    Marland Kitchen glucosamine-chondroitin 500-400 MG tablet Take 1 tablet by mouth daily.    . Multiple Vitamin (MULTIVITAMIN) tablet Take 1 tablet by mouth daily.    Marland Kitchen tiZANidine (ZANAFLEX) 4 MG tablet as needed.  1   No current facility-administered medications for this visit.      ALLERGIES: Oxycodone  Family History  Problem Relation Age of Onset  . Heart attack Father   . Hypertension Maternal Grandmother     Social History   Social History  . Marital status: Married    Spouse name: [name]  . Number of  children: [name]  . Years of education: [name]   Occupational History  . Not on file.   Social History Main Topics  . Smoking status: Never Smoker  . Smokeless tobacco: Never Used  . Alcohol use No  . Drug use: No  . Sexual activity: Yes    Partners: Male    Birth control/ protection: Post-menopausal     Comment: husband with vasectomy   Other Topics Concern  . Not on file   Social History Narrative  . No narrative on file    ROS:  Pertinent items are noted in HPI.  PHYSICAL EXAMINATION:    BP 122/80 (BP Location: Right Arm, Patient Position: Sitting, Cuff Size: Small)   Pulse 80   Ht 5' 7"  (1.702 m)   Wt 171 lb (77.6 kg)   LMP 03/20/2006 (Approximate)   BMI 26.78 kg/m     General appearance: alert, cooperative and appears stated age   Pelvic: External genitalia:  no lesions              Urethra:  normal appearing urethra with no masses, tenderness or lesions              Bartholins and Skenes: normal                 Vagina: normal appearing vagina with normal color and discharge, no lesions              Cervix: no lesions                Bimanual Exam:  Uterus:  normal size, contour, position, consistency, mobility, non-tender              Adnexa: no mass, fullness, tenderness             Chaperone was present for exam.  ASSESSMENT  Status post hysteroscopy with dilation and curettage for postmenopausal bleeding.  Benign endometrium.  I still do not think it is clear the etiology of the bleeding episode. Ovulation?  Hormonal supplementation effect? Urinary frequency.  Negative urine dip.   PLAN  She will monitor for any future bleeding and report back to me.  I will update Dr. Jana Hakim on her recent events so they can determine how or if this affects her breast cancer follow up care.  No urine micro or cx needed.  She will see me in about 6 months for an annual exam.    An After Visit Summary was printed and given to the patient.  __15____ minutes face to  face time of which over 50% was spent in counseling.

## 2017-07-25 DIAGNOSIS — M545 Low back pain: Secondary | ICD-10-CM | POA: Diagnosis not present

## 2017-07-25 DIAGNOSIS — M4306 Spondylolysis, lumbar region: Secondary | ICD-10-CM | POA: Diagnosis not present

## 2017-07-25 DIAGNOSIS — M47817 Spondylosis without myelopathy or radiculopathy, lumbosacral region: Secondary | ICD-10-CM | POA: Diagnosis not present

## 2017-08-14 DIAGNOSIS — M4317 Spondylolisthesis, lumbosacral region: Secondary | ICD-10-CM | POA: Diagnosis not present

## 2017-08-15 ENCOUNTER — Ambulatory Visit
Admission: RE | Admit: 2017-08-15 | Discharge: 2017-08-15 | Disposition: A | Discharge status: Home / Self Care | Payer: BLUE CROSS/BLUE SHIELD | Source: Ambulatory Visit | Attending: Oncology | Admitting: Oncology

## 2017-08-15 DIAGNOSIS — Z1231 Encounter for screening mammogram for malignant neoplasm of breast: Secondary | ICD-10-CM | POA: Diagnosis not present

## 2017-08-15 HISTORY — DX: Personal history of antineoplastic chemotherapy: Z92.21

## 2017-09-02 NOTE — Progress Notes (Signed)
ID: Janice [name]   DOB: 1966-05-21  MR#: [id number]  NWG#:[id number]   PCP:  Josefa Half, MD GYN:  Josefa Half, MD SUR:  OTHER:  Ila Mcgill, DPM  CHIEF COMPLAINT:  Estrogen receptor positive breast cancer  CURRENT TREATMENT: Observation   HISTORY OF PRESENT ILLNESS: From the original intake note:  She had a screening mammogram in October 2007, which showed some calcifications in the right breast.  Diagnostic mammogram of the right breast October 17, showed these calcifications to be most likely benign, but a six-month mammogram was recommended.  She then had a unilateral right diagnostic mammogram on February 22, 2006, which showed some increase in the number of microcalcifications in the right breast upper-outer quadrant as compared to the prior study.  The same day a biopsy was performed and showed ([id number] and [id number]) an invasive ductal carcinoma, which was strongly ER and PR positive, with an elevated proliferation marker at 31%.  Hercept test negative and negative for HER-2 amplification by FISH.    With this information, the patient was referred to Dr. Annamaria Boots, and MRI of both breasts was obtained April 14. This showed only the solitary area of abnormal enhancement corresponding to the previously biopsied area.  There was no obvious lymphadenopathy.    Accordingly, on March 12, 2006, the patient proceeded to definitive local surgery.  Unfortunately, the sentinel lymph node was positive, so she proceeded to full axillary dissection, as well as of course the right lumpectomy.  The final pathology ([id number]) showed a 1.2 cm infiltrating ductal carcinoma, grade 3, with a total of 2 of 23 lymph nodes involved (an additional lymph node had isolated tumor cells).  Margins were negative and ample.  LVI was present.    Her subsequent history is as detailed below.  INTERVAL HISTORY: Kaeden returns today for follow-up of her estrogen receptor positive breast cancer. At the last visit she went off  tamoxifen, and is now currently under observation. She is unable to tell a difference from no longer taking tamoxifen. She is doing well overall. She is still currently working part time.    Since her last visit to the office, she had an abnormal pap smear on 05/28/2017 that resulted as ASCUS with a negative HPV test. She then underwent an endometrial biopsy on 05/31/2017 [phone]) due to abnormal pap results that showed: Endometrium, biopsy. Degenerating secretory-type endometrium. Microscopic comment: This pattern can be associated with menses, irregular shedding or breakthrough bleeding associated with hormonal therapy. She subsequently underwent endometrium cutterage on 06/14/2017 ([id number]) with results that showed: 1. Endometrium, curettage. Strips of inactive endometrial glands. Fragments of the lower uterine segment. Benign endocervical glandular epithelium.No hyperplasia or malignancy identified. 2. Endocervix, curettage. Fragments of inactive endometrium. Benign endocervical glandular epithelium. No hyperplasia or malignancy identified.   She also most recently had a Diagnostic right breast mammography with tomography completed on 08/15/2017.     ROS: Mertie reports that she takes walks up to a couple times a week. She notes that this is a decrease due to her children and foot pain. She reports that she voices concern for parathyroid issues as her sister was recently diagnosed with this. She denies unusual headaches, visual changes, nausea, vomiting, or dizziness. There has been no unusual cough, phlegm production, or pleurisy. This been no change in bowel or bladder habits. She denies unexplained fatigue or unexplained weight loss, bleeding, rash, or fever. A detailed review of systems was otherwise negative.    PAST MEDICAL HISTORY: Past Medical History:  Diagnosis Date  . Atypical squamous cell changes of undetermined significance (ASCUS) on cervical cytology with negative high risk human  papilloma virus (HPV) test result 2018  . Cancer ([place]) 02/22/2006   rt breast cancer  . Crohn's disease [facility]) dx 2004  Crohn's ileitis   04-14-2003 resection terminal ileum, cecum, and appendix  . History of breast cancer oncologist-  dr Jana Hakim---  no recurrence (lov 11-29-2016)   dx 04/ 2007 --  Right breast carcinoma , invasive DCIS, Stage IIB, Grade 2 (T1c N1) ER/PR positive, HER-2 negative--  03-12-2006  s/p  right lumpectomy w/ sln bx's and dissection --  adjuvant chemo and radiation therapy completed 01/ 2008-- completed tamoxifen 01/ 2018  . History of cancer chemotherapy completed 01/ 2008   right breast  . History of genital warts    as teenager--  per pt frozen and did not return  . Personal history of chemotherapy   . Personal history of radiation therapy completed 01/ 2008   right breast  . PMB (postmenopausal bleeding) 06/07/2017  Significant chiefly for Crohn's disease.  This is followed by Dr. Cristina Gong.  The patient has had prior surgery for Crohn's.  She has also had removal of a left breast cyst remotely by Dr. Zettie Pho.  The Crohn's surgery was performed by Dr. Excell Seltzer.    FAMILY HISTORY The patient's father is alive at age 41.  The patient's mother is alive at age 62.  The patient has a sister, who had cervical cancer in her 6s, but is now fine.  There is no history of breast or ovarian cancer in the family.  GYNECOLOGIC HISTORY:  (Updated 03/04/2014) She is GX P2, first pregnancy age 54.  She nursed a little bit with both pregnancies.  Last menstrual period was April 2007.  SOCIAL HISTORY:  (updated 03/04/2014) The patient is a homemaker.  Her husband, Sherren Mocha, is in the building trade.    She has a daughter age 62, who is attending [employer] for criminal justice, and a son age 16 currently in [place] working in Scientist, research (life sciences) estate.  The patient attends [employer].     ADVANCED DIRECTIVES: not in place  HEALTH MAINTENANCE: (Updated 03/04/2014) Social History   Substance Use Topics  . Smoking status: Never Smoker  . Smokeless tobacco: Never Used  . Alcohol use No     Colonoscopy: Buccini, February 2011   PAP:  September 2014/[facility]  Bone density:  September 2014   Lipid panel:  April 2014    Allergies  Allergen Reactions  . Oxycodone Nausea And Vomiting    "percocet"    Current Outpatient Prescriptions  Medication Sig Dispense Refill  . cholecalciferol (VITAMIN D) 400 UNITS TABS Take 5,000 Units by mouth daily.    Marland Kitchen glucosamine-chondroitin 500-400 MG tablet Take 1 tablet by mouth daily.    . Multiple Vitamin (MULTIVITAMIN) tablet Take 1 tablet by mouth daily.    Marland Kitchen tiZANidine (ZANAFLEX) 4 MG tablet as needed.  1   No current facility-administered medications for this visit.     OBJECTIVE: Middle-aged white woman In no acute distress  Vitals:   09/05/17 1409  BP: 133/72  Pulse: 75  Resp: 20  Temp: 98.7 F (37.1 C)  SpO2: 100%     Body mass index is 27.14 kg/m.    ECOG FS: 0 Filed Weights   09/05/17 1409  Weight: 173 lb 4.8 oz (78.6 kg)   Sclerae unicteric, EOMs intact Oropharynx clear and moist No cervical or supraclavicular  adenopathy Lungs no rales or rhonchi Heart regular rate and rhythm Abd soft, nontender, positive bowel sounds MSK no focal spinal tenderness, no upper extremity lymphedema Neuro: nonfocal, well oriented, appropriate affect Breasts: Right breast status post lumpectomy followed by radiation, with no evidence of disease recurrence. The left breast is unremarkable. Both axillae are benign.  LAB RESULTS:   Lab Results  Component Value Date   WBC 5.2 09/05/2017   NEUTROABS 3.6 09/05/2017   HGB 13.4 09/05/2017   HCT 39.0 09/05/2017   MCV 93.1 09/05/2017   PLT 158 09/05/2017      Chemistry      Component Value Date/Time   NA 144 11/29/2016 1359   K 3.9 11/29/2016 1359   CL 106 02/18/2013 0829   CO2 27 11/29/2016 1359   BUN 11.6 11/29/2016 1359   CREATININE 0.8 11/29/2016 1359       Component Value Date/Time   CALCIUM 9.7 11/29/2016 1359   ALKPHOS 60 11/29/2016 1359   AST 22 11/29/2016 1359   ALT 20 11/29/2016 1359   BILITOT 0.55 11/29/2016 1359      STUDIES: Mm Screening Breast Tomo Bilateral  Result Date: 08/15/2017 CLINICAL DATA:  Screening. EXAM: 2D DIGITAL SCREENING BILATERAL MAMMOGRAM WITH CAD AND ADJUNCT TOMO COMPARISON:  Previous exam(s). ACR Breast Density Category b: There are scattered areas of fibroglandular density. FINDINGS: There are no findings suspicious for malignancy. Images were processed with CAD. IMPRESSION: No mammographic evidence of malignancy. A result letter of this screening mammogram will be mailed directly to the patient. RECOMMENDATION: Screening mammogram in one year. (Code:SM-B-01Y) BI-RADS CATEGORY  1: Negative. 3: Probably benign.  Please Insert Correct Screening Template Electronically Signed   By: Lillia Mountain M.D.   On: 08/15/2017 15:13    ASSESSMENT: 51 y.o.  BRCA negative [place] woman   (1)  status post right lumpectomy and axillary lymph node dissection April of 2007 for a T1c N1, stage IIB invasive ductal carcinoma, grade 2, estrogen and progesterone receptor positive, HER-2 negative  with an MIB-1 of 31%   (2) treated adjuvantly with Cytoxan and Adriamycin x4 in dose dense fashion followed by weekly paclitaxel x12   (3) s/p adjuvant radiation completed in January of 2008   (4) on tamoxifen as of January of 2008, stopped at 10 years,  January 2018  (5) discussed the possibility of additional genetics testing April 2016; declined  PLAN:  Jalani is now 11-1/2 years out from definitive surgery for her breast cancer with no evidence of disease recurrence. This is very favorable.  She understands she had optimal antiestrogen therapy and we do not have any data that further antiestrogen's would be useful in terms of breast cancer recurrence risk reduction.  "With some trepidation" she has decided to "cut the cord," and get  out of the cancer business. I think this is a very hopeful and positive decision and I strongly encouraged her  Accordingly she is being released to her primary care physician for all she will need in terms of breast cancer follow-up this a yearly mammogram and a yearly physician breast exam.  Of course we will keep her chart (for another 10 years and I am available to her at any time if and when the need arises. We are simply not making any further routine appointments for her here   Magrinat, Virgie Dad, MD  09/05/17 2:18 PM Medical Oncology and Hematology [facility] [address] [place], [place] [address] Tel. [phone]  Fax. [phone]  This document serves as a record of services personally performed by Lurline Del, MD. It was created on her behalf by Steva Colder, a trained medical scribe. The creation of this record is based on the scribe's personal observations and the provider's statements to them. This document has been checked and approved by the attending provider.

## 2017-09-04 ENCOUNTER — Telehealth: Payer: Self-pay | Admitting: Obstetrics and Gynecology

## 2017-09-04 DIAGNOSIS — M8580 Other specified disorders of bone density and structure, unspecified site: Secondary | ICD-10-CM

## 2017-09-04 NOTE — Telephone Encounter (Signed)
Patient says she thought Dr Quincy Simmonds wanted her to have a bone density scan but she is not sure.

## 2017-09-04 NOTE — Telephone Encounter (Signed)
Spoke with patient. Patient unsure if she is due for BMD.   Advised patient last BMD 07/17/11, order placed for BMD at [facility] 09/06/2016. Advised patient this order will expire, new order placed for BMD at [facility] for Hx of osteopenia. Advised patient to call [facility] directly for scheduling. Patient verbalizes understanding and is agreeable.   Routing to provider for final review. Patient is agreeable to disposition. Will close encounter.

## 2017-09-05 ENCOUNTER — Other Ambulatory Visit (HOSPITAL_BASED_OUTPATIENT_CLINIC_OR_DEPARTMENT_OTHER): Payer: BLUE CROSS/BLUE SHIELD

## 2017-09-05 ENCOUNTER — Ambulatory Visit (HOSPITAL_BASED_OUTPATIENT_CLINIC_OR_DEPARTMENT_OTHER): Payer: BLUE CROSS/BLUE SHIELD | Admitting: Oncology

## 2017-09-05 DIAGNOSIS — Z79810 Long term (current) use of selective estrogen receptor modulators (SERMs): Secondary | ICD-10-CM

## 2017-09-05 DIAGNOSIS — C50911 Malignant neoplasm of unspecified site of right female breast: Secondary | ICD-10-CM

## 2017-09-05 DIAGNOSIS — Z853 Personal history of malignant neoplasm of breast: Secondary | ICD-10-CM

## 2017-09-05 DIAGNOSIS — Z170 Estrogen receptor positive status [ER+]: Principal | ICD-10-CM

## 2017-09-05 DIAGNOSIS — C50411 Malignant neoplasm of upper-outer quadrant of right female breast: Secondary | ICD-10-CM

## 2017-09-05 LAB — CBC WITH DIFFERENTIAL/PLATELET
BASO%: 0.6 % (ref 0.0–2.0)
Basophils Absolute: 0 10*3/uL (ref 0.0–0.1)
EOS%: 1.5 % (ref 0.0–7.0)
Eosinophils Absolute: 0.1 10*3/uL (ref 0.0–0.5)
HCT: 39 % (ref 34.8–46.6)
HGB: 13.4 g/dL (ref 11.6–15.9)
LYMPH%: 22.8 % (ref 14.0–49.7)
MCH: 32 pg (ref 25.1–34.0)
MCHC: 34.4 g/dL (ref 31.5–36.0)
MCV: 93.1 fL (ref 79.5–101.0)
MONO#: 0.3 10*3/uL (ref 0.1–0.9)
MONO%: 5.8 % (ref 0.0–14.0)
NEUT#: 3.6 10*3/uL (ref 1.5–6.5)
NEUT%: 69.3 % (ref 38.4–76.8)
Platelets: 158 10*3/uL (ref 145–400)
RBC: 4.19 10*6/uL (ref 3.70–5.45)
RDW: 12.8 % (ref 11.2–14.5)
WBC: 5.2 10*3/uL (ref 3.9–10.3)
lymph#: 1.2 10*3/uL (ref 0.9–3.3)
nRBC: 0 % (ref 0–0)

## 2017-09-05 LAB — COMPREHENSIVE METABOLIC PANEL
ALT: 15 U/L (ref 0–55)
AST: 19 U/L (ref 5–34)
Albumin: 4.2 g/dL (ref 3.5–5.0)
Alkaline Phosphatase: 76 U/L (ref 40–150)
Anion Gap: 5 mEq/L (ref 3–11)
BUN: 11.1 mg/dL (ref 7.0–26.0)
CO2: 27 mEq/L (ref 22–29)
Calcium: 9.3 mg/dL (ref 8.4–10.4)
Chloride: 108 mEq/L (ref 98–109)
Creatinine: 0.8 mg/dL (ref 0.6–1.1)
EGFR: >60 mL/min/{1.73_m2} (ref >60)
Glucose: 88 mg/dl (ref 70–140)
Potassium: 4 mEq/L (ref 3.5–5.1)
Sodium: 141 mEq/L (ref 136–145)
Total Bilirubin: 1.13 mg/dL (ref 0.20–1.20)
Total Protein: 7.1 g/dL (ref 6.4–8.3)

## 2017-09-19 ENCOUNTER — Ambulatory Visit: Payer: BLUE CROSS/BLUE SHIELD | Admitting: Obstetrics and Gynecology

## 2017-09-25 ENCOUNTER — Ambulatory Visit
Admission: RE | Admit: 2017-09-25 | Discharge: 2017-09-25 | Disposition: A | Discharge status: Home / Self Care | Payer: BLUE CROSS/BLUE SHIELD | Source: Ambulatory Visit | Attending: Obstetrics and Gynecology | Admitting: Obstetrics and Gynecology

## 2017-09-25 DIAGNOSIS — Z780 Asymptomatic menopausal state: Secondary | ICD-10-CM | POA: Diagnosis not present

## 2017-09-25 DIAGNOSIS — M8589 Other specified disorders of bone density and structure, multiple sites: Secondary | ICD-10-CM | POA: Diagnosis not present

## 2017-09-25 DIAGNOSIS — M8580 Other specified disorders of bone density and structure, unspecified site: Secondary | ICD-10-CM

## 2017-09-26 ENCOUNTER — Encounter: Payer: Self-pay | Admitting: Obstetrics and Gynecology

## 2017-10-02 ENCOUNTER — Other Ambulatory Visit: Payer: Self-pay

## 2017-10-02 ENCOUNTER — Encounter: Payer: Self-pay | Admitting: Obstetrics and Gynecology

## 2017-10-02 ENCOUNTER — Ambulatory Visit (INDEPENDENT_AMBULATORY_CARE_PROVIDER_SITE_OTHER): Payer: BLUE CROSS/BLUE SHIELD | Admitting: Obstetrics and Gynecology

## 2017-10-02 VITALS — BP 142/90 | HR 68 | Resp 16 | Wt 173.0 lb

## 2017-10-02 DIAGNOSIS — R002 Palpitations: Secondary | ICD-10-CM

## 2017-10-02 DIAGNOSIS — F439 Reaction to severe stress, unspecified: Secondary | ICD-10-CM

## 2017-10-02 NOTE — Progress Notes (Signed)
Scheduled patient while in office to see Dr.Croitoru at [facility] on 10/04/2017 at 3:40 pm. Patient is agreeable to date and time.

## 2017-10-02 NOTE — Patient Instructions (Signed)
Palpitations A palpitation is the feeling that your heartbeat is irregular or is faster than normal. It may feel like your heart is fluttering or skipping a beat. Palpitations are usually not a serious problem. They may be caused by many things, including smoking, caffeine, alcohol, stress, and certain medicines. Although most causes of palpitations are not serious, palpitations can be a sign of a serious medical problem. In some cases, you may need further medical evaluation. Follow these instructions at home: Pay attention to any changes in your symptoms. Take these actions to help with your condition:  Avoid the following: ? Caffeinated coffee, tea, soft drinks, diet pills, and energy drinks. ? Chocolate. ? Alcohol.  Do not use any tobacco products, such as cigarettes, chewing tobacco, and e-cigarettes. If you need help quitting, ask your health care provider.  Try to reduce your stress and anxiety. Things that can help you relax include: ? Yoga. ? Meditation. ? Physical activity, such as swimming, jogging, or walking. ? Biofeedback. This is a method that helps you learn to use your mind to control things in your body, such as your heartbeats.  Get plenty of rest and sleep.  Take over-the-counter and prescription medicines only as told by your health care provider.  Keep all follow-up visits as told by your health care provider. This is important.  Contact a health care provider if:  You continue to have a fast or irregular heartbeat after 24 hours.  Your palpitations occur more often. Get help right away if:  You have chest pain or shortness of breath.  You have a severe headache.  You feel dizzy or you faint. This information is not intended to replace advice given to you by your health care provider. Make sure you discuss any questions you have with your health care provider. Document Released: 11/03/2000 Document Revised: 04/10/2016 Document Reviewed: 07/22/2015 Elsevier  Interactive Patient Education  2017 Elsevier Inc.  

## 2017-10-02 NOTE — Progress Notes (Signed)
GYNECOLOGY  VISIT   HPI: 51 y.o.   Married  Caucasian  female   G2P2002 with Patient's last menstrual period was 03/20/2006 (approximate).   here for     When sitting or driving car, she has sudden onset of tachycardia and flushing.  Lasts for 20 - 30 seconds.  Happens several times a day.  Some headaches.   No chest pain or pressure.  No shortness of breath.   Not feeling anxious when it occurs.  No hx of stress or anxiety.   No cardiac hx.  Eats dark chocolate daily.  No other caffeine use.   Had an ECHO in 2007. Started chemotherapy around this time.   Parents have dementia and patient is caring for them.  Significant stress about this.   GYNECOLOGIC HISTORY: Patient's last menstrual period was 03/20/2006 (approximate). Contraception:  Postmenopausal Menopausal hormone therapy:  none Last mammogram:   05-30-17 Diag.Rt.and Rt.Br.U/Sfor Br.pain/Neg/BiRads2--Pt.had screening MMG 08-15-17. 08-09-16 Density B/Neg/BiRads/TBC -- see Epic Last pap smear:   05/28/17 ASCUS. Neg HR HPV                              08-25-15 Neg:Neg HR HPV        OB History    Gravida Para Term Preterm AB Living   _0 SAB TAB Ectopic Multiple Live Births                     Patient Active Problem List   Diagnosis Date Noted  . Malignant neoplasm of upper-outer quadrant of right breast in female, estrogen receptor positive ([place]) 09/05/2017  . Breast cancer, right breast ([place]) 03/08/2015  . Dysuria 03/04/2014    Past Medical History:  Diagnosis Date  . Atypical squamous cell changes of undetermined significance (ASCUS) on cervical cytology with negative high risk human papilloma virus (HPV) test result 2018  . Cancer ([place]) 02/22/2006   rt breast cancer  . Crohn's disease [facility]) dx 2004  Crohn's ileitis   04-14-2003 resection terminal ileum, cecum, and appendix  . History of breast cancer oncologist-  dr Jana Hakim---  no recurrence (lov 11-29-2016)   dx 04/ 2007 --  Right breast  carcinoma , invasive DCIS, Stage IIB, Grade 2 (T1c N1) ER/PR positive, HER-2 negative--  03-12-2006  s/p  right lumpectomy w/ sln bx's and dissection --  adjuvant chemo and radiation therapy completed 01/ 2008-- completed tamoxifen 01/ 2018  . History of cancer chemotherapy completed 01/ 2008   right breast  . History of genital warts    as teenager--  per pt frozen and did not return  . Osteopenia 2018  . Personal history of chemotherapy   . Personal history of radiation therapy completed 01/ 2008   right breast  . PMB (postmenopausal bleeding) 06/07/2017    Past Surgical History:  Procedure Laterality Date  . BREAST LUMPECTOMY Right   . COLONOSCOPY    . D & C HYSTEROSCOPIC RESECTION POLYP  11-29-2009   dr Joan Flores  . LAPAROSCOPIC CHOLECYSTECTOMY  09/16/2002  . PORT-A-CATH REMOVAL  01/03/2007  . RESECTION TERMINAL ILEUM, CECUM, AND APPENDIX  04-14-2003   dr Excell Seltzer   Crohn's ileitis  . RIGHT BREAST LUMPECTOMY W/ MULTIPLE AXILLARY SENTINAL NODE BX'S AND DISSECTION  03/12/2006  . TRANSTHORACIC ECHOCARDIOGRAM  04/05/2006   ef 55-60%/  trivial MR and TR    Current Outpatient Medications  Medication Sig  Dispense Refill  . cholecalciferol (VITAMIN D) 400 UNITS TABS Take 5,000 Units by mouth daily.    . glucosamine-chondroitin 500-400 MG tablet Take 1 tablet by mouth daily.    . Multiple Vitamin (MULTIVITAMIN) tablet Take 1 tablet by mouth daily.    . tiZANidine (ZANAFLEX) 4 MG tablet as needed.  1   No current facility-administered medications for this visit.      ALLERGIES: Oxycodone  Family History  Problem Relation Age of Onset  . Heart attack Father   . Hypertension Maternal Grandmother   . Breast cancer Neg Hx     Social History   Socioeconomic History  . Marital status: Married    Spouse name: Not on file  . Number of children: Not on file  . Years of education: Not on file  . Highest education level: Not on file  Social Needs  . Financial resource strain: Not on  file  . Food insecurity - worry: Not on file  . Food insecurity - inability: Not on file  . Transportation needs - medical: Not on file  . Transportation needs - non-medical: Not on file  Occupational History  . Not on file  Tobacco Use  . Smoking status: Never Smoker  . Smokeless tobacco: Never Used  Substance and Sexual Activity  . Alcohol use: No    Alcohol/week: 0.0 oz  . Drug use: No  . Sexual activity: Yes    Partners: Male    Birth control/protection: Post-menopausal    Comment: husband with vasectomy  Other Topics Concern  . Not on file  Social History Narrative  . Not on file    ROS:  Pertinent items are noted in HPI.  PHYSICAL EXAMINATION:    BP (!) 142/90 (BP Location: Right Arm, Patient Position: Sitting, Cuff Size: Normal)   Pulse 68   Resp 16   Wt 173 lb (78.5 kg)   LMP 03/20/2006 (Approximate)   BMI 27.10 kg/m     General appearance: alert, cooperative and appears stated age   ASSESSMENT  Cardiac palpitations.  Situational stress.  PLAN  Will refer to cardiology for further evaluation and treatment.  We talked about resources available for her family in the care of patient's with dementia.  I encouraged family discussion about a safe and monitored living environment for her parents.  She will follow up with me as needed.   An After Visit Summary was printed and given to the patient.  15 minutes face to face time of which over 50% was spent in counseling.   

## 2017-10-03 ENCOUNTER — Telehealth: Payer: Self-pay | Admitting: *Deleted

## 2017-10-03 NOTE — Telephone Encounter (Signed)
Updated BMD report placed on Dr. Elza Rafter desk for review.  Routing to Dr. Quincy Simmonds.   Notes recorded by Burnice Logan, RN on 10/03/2017 at 11:26 AM EST Reviewed with Mable Paris Tech at [facility]. Corrected report faxed to office. See telephone encounter dated 10/03/17 to update provider. ------  Notes recorded by Burnice Logan, RN on 09/28/2017 at 2:42 PM EST Reviewed with Bailey Mech in medical records, will forward to Bone Density department for review and corrections. Advised to return call to office to notify once completed. ------

## 2017-10-04 ENCOUNTER — Encounter: Payer: Self-pay | Admitting: Cardiovascular Disease

## 2017-10-04 ENCOUNTER — Ambulatory Visit (INDEPENDENT_AMBULATORY_CARE_PROVIDER_SITE_OTHER): Payer: BLUE CROSS/BLUE SHIELD | Admitting: Cardiovascular Disease

## 2017-10-04 VITALS — BP 136/84 | HR 70 | Ht 66.5 in | Wt 173.0 lb

## 2017-10-04 DIAGNOSIS — Z853 Personal history of malignant neoplasm of breast: Secondary | ICD-10-CM

## 2017-10-04 DIAGNOSIS — R002 Palpitations: Secondary | ICD-10-CM

## 2017-10-04 NOTE — Progress Notes (Signed)
Cardiology ConsultationNote:    Date:  10/04/2017   ID:  Janice [name], DOB February 19, 1966, MRN [id number]  PCP:  Janice Cobbs, MD  Cardiologist:  Janice Klein, MD    Referring MD: Janice [name]*   Chief Complaint  Patient presents with  . New Patient (Initial Visit)   Janice [name] is a 51 y.o. female who is being seen today for the evaluation of palpitations at the request of Janice [name]*.   History of Present Illness:    Janice [name] is a 51 y.o. female with a hx of Crohn's disease and right breast cancer treated with surgery, radiation therapy and anthracycline-based chemotherapy in 2007, but without any history of structural heart disease or known cardiac risk factors.  I have met her before when she has accompanied her father, Janice [name] to clinic.  This is her first visit as a patient.  Over the last 3-4 weeks she has had palpitations several times a day, almost every day.  These are described as very abrupt onset and abrupt termination and last for roughly 10 seconds each time.  She describes the heartbeat as being very fast but regular and she feels it in her upper chest and lower throat area.  The palpitations associated sensation of mild lightheadedness, but she has no other symptoms of chest pain, dyspnea and has not experienced syncope.  She admits to being much more anxious in the last several weeks, due to the advanced age and multiple medical and social problems of both her parents.  She does not drink caffeine other than a glass of sweet tea once or twice a week.  She has a single small piece of dark chocolate after lunch every day.  The palpitations do not seem to be associated with either 1 of these.  The symptoms are also not associated with exertion.  In fact when she is able to go for longer walks when she does housework, the palpitations do not bother her.  She does not have hypertension, diabetes, hyperlipidemia, history of  smoking.  Her father has congestive heart failure and has had strokes.  Her mother has parkinsonism and high blood pressure.  She used to exercise by walking on a regular basis, but has found it hard to some of the motivation since she has been more involved with her parents care.  She does not snore and does not have daytime hypersomnolence.  She denies focal neurological complaints, claudication and leg edema.  Past Medical History:  Diagnosis Date  . Atypical squamous cell changes of undetermined significance (ASCUS) on cervical cytology with negative high risk human papilloma virus (HPV) test result 2018  . Cancer ([place]) 02/22/2006   rt breast cancer  . Crohn's disease [facility]) dx 2004  Crohn's ileitis   04-14-2003 resection terminal ileum, cecum, and appendix  . History of breast cancer oncologist-  Janice [name]---  no recurrence (lov 11-29-2016)   dx 04/ 2007 --  Right breast carcinoma , invasive DCIS, Stage IIB, Grade 2 (T1c N1) ER/PR positive, HER-2 negative--  03-12-2006  s/p  right lumpectomy w/ sln bx's and dissection --  adjuvant chemo and radiation therapy completed 01/ 2008-- completed tamoxifen 01/ 2018  . History of cancer chemotherapy completed 01/ 2008   right breast  . History of genital warts    as teenager--  per pt frozen and did not return  . Osteopenia 2018  . Personal history of chemotherapy   .  Personal history of radiation therapy completed 01/ 2008   right breast  . PMB (postmenopausal bleeding) 06/07/2017    Past Surgical History:  Procedure Laterality Date  . BREAST LUMPECTOMY Right   . COLONOSCOPY    . D & C HYSTEROSCOPIC RESECTION POLYP  11-29-2009   Janice [name]  . HYSTEROSCOPY W/D&C [name] 06/14/2017   Procedure: DILATATION AND CURETTAGE /HYSTEROSCOPY;  Surgeon: Janice Cobbs, MD;  Location: [facility];  Service: Gynecology;  Laterality: [name];  . LAPAROSCOPIC CHOLECYSTECTOMY  09/16/2002  . PORT-A-CATH REMOVAL  01/03/2007  . RESECTION  TERMINAL ILEUM, CECUM, AND APPENDIX  04-14-2003   Janice Janice [name]   Crohn's ileitis  . RIGHT BREAST LUMPECTOMY W/ MULTIPLE AXILLARY SENTINAL NODE BX'S AND DISSECTION  03/12/2006  . TRANSTHORACIC ECHOCARDIOGRAM  04/05/2006   ef 55-60%/  trivial MR and TR    Current Medications: Current Meds  Medication Sig  . cholecalciferol (VITAMIN D) 400 UNITS TABS Take 5,000 Units by mouth daily.  Marland Kitchen glucosamine-chondroitin 500-400 MG tablet Take 1 tablet by mouth daily.  . Multiple Vitamin (MULTIVITAMIN) tablet Take 1 tablet by mouth daily.  Marland Kitchen tiZANidine (ZANAFLEX) 4 MG tablet as needed.     Allergies:   Oxycodone   Social History   Socioeconomic History  . Marital status: Married    Spouse name: None  . Number of children: None  . Years of education: None  . Highest education level: None  Social Needs  . Financial resource strain: None  . Food insecurity - worry: None  . Food insecurity - inability: None  . Transportation needs - medical: None  . Transportation needs - non-medical: None  Occupational History  . None  Tobacco Use  . Smoking status: Never Smoker  . Smokeless tobacco: Never Used  Substance and Sexual Activity  . Alcohol use: No    Alcohol/week: 0.0 oz  . Drug use: No  . Sexual activity: Yes    Partners: Male    Birth control/protection: Post-menopausal    Comment: husband with vasectomy  Other Topics Concern  . None  Social History Narrative  . None     Family History: The patient's family history includes Heart attack in her father; Hypertension in her maternal grandmother. There is no history of Breast cancer. ROS:   Please see the history of present illness.     All other systems reviewed and are negative.  EKGs/Labs/Other Studies Reviewed:     EKG:  EKG is ordered today.  The ekg ordered today demonstrates normal sinus rhythm, normal tracing  Recent Labs: 09/05/2017: ALT 15; BUN 11.1; Creatinine 0.8; HGB 13.4; Platelets 158; Potassium 4.0; Sodium 141    Recent Lipid Panel    Component Value Date/Time   CHOL 136 09/29/2015 0824   TRIG 135 09/29/2015 0824   HDL 67 09/29/2015 0824   CHOLHDL 2.0 09/29/2015 0824   VLDL 27 09/29/2015 0824   LDLCALC 42 09/29/2015 0824    Physical Exam:    VS:  BP 136/84   Pulse 70   Ht 5' 6.5" (1.689 m)   Wt 173 lb (78.5 kg)   LMP 03/20/2006 (Approximate)   BMI 27.50 kg/m     Wt Readings from Last 3 Encounters:  10/04/17 173 lb (78.5 kg)  10/02/17 173 lb (78.5 kg)  09/05/17 173 lb 4.8 oz (78.6 kg)     GEN:  Well nourished, well developed in no acute distress HEENT: Normal NECK: No JVD; No carotid bruits LYMPHATICS: No lymphadenopathy  CARDIAC: RRR, no murmurs, rubs, gallops RESPIRATORY:  Clear to auscultation without rales, wheezing or rhonchi  ABDOMEN: Soft, non-tender, non-distended MUSCULOSKELETAL:  No edema; No deformity  SKIN: Warm and dry NEUROLOGIC:  Alert and oriented x 3 PSYCHIATRIC:  Normal affect   ASSESSMENT:    1. Heart palpitations   2. History of right breast cancer    PLAN:    In order of problems listed above:  1. Palpitations: These appear to represent true brief spells of arrhythmia with their abrupt onset and abrupt termination.  ECG is normal, without evidence of preexcitation.  We will try to catch them on a rhythm monitor.  She would prefer not to take additional medication but once the reassurance that they do not represent something serious.  We discussed the fact that in addition to identifying the actual source of her palpitations, it would be very important to make sure she has an otherwise structurally healthy heart. She had an echocardiogram around the time of her treatment with Adriamycin 11 years ago.  This was a normal study but was done prechemotherapy.  I find no sign of an echo done after the completion of chemo.  We will repeat an echocardiogram. 2. History of breast cancer: Treatment included surgery, chest radiation therapy and chemotherapy including  Adriamycin.  Repeat an echocardiogram for signs of cardiomyopathy from the anthracycline or indirect signs of coronary insufficiency that could be related to radiation.   Medication Adjustments/Labs and Tests Ordered: Current medicines are reviewed at length with the patient today.  Concerns regarding medicines are outlined above.  Orders Placed This Encounter  Procedures  . CARDIAC EVENT MONITOR  . EKG 12-Lead  . ECHOCARDIOGRAM COMPLETE   No orders of the defined types were placed in this encounter.   Signed, Janice Klein, MD  10/04/2017 5:50 PM    [facility]

## 2017-10-04 NOTE — Patient Instructions (Signed)
Medication Instructions: Dr Sallyanne Kuster recommends that you continue on your current medications as directed. Please refer to the Current Medication list given to you today.  Labwork: NONE ORDERED  Testing/Procedures: 1. Echocardiogram - Your physician has requested that you have an echocardiogram. Echocardiography is a painless test that uses sound waves to create images of your heart. It provides your doctor with information about the size and shape of your heart and how well your heart's chambers and valves are working. This procedure takes approximately one hour. There are no restrictions for this procedure.  2. 14-day Cardiac Event Monitor - Your physician has recommended that you wear an event monitor. Event monitors are medical devices that record the heart's electrical activity. Doctors most often [place] these monitors to diagnose arrhythmias. Arrhythmias are problems with the speed or rhythm of the heartbeat. The monitor is a small, portable device. You can wear one while you do your normal daily activities. This is usually used to diagnose what is causing palpitations/syncope (passing out).  >>These studies have been ordered to be completed/placed at our [facility] location - [address]  Follow-up: Dr Sallyanne Kuster recommends that you follow-up with him as needed.

## 2017-10-05 NOTE — Telephone Encounter (Signed)
Please report results to the patient showing osteopenia of the hip and spine.  She has had some bone loss since her BMD in 2012.  Her fracture risk is still considered to be low.   No prescription bond building medication is indicated.   I recommend weight bearing exercise, calcium 1200 mg daily in divided doses, and al least vitamin D 800 IU daily.   I see in her chart that she has been on high dose Vit D, but I do not see a vit D level check in Epic. This may have been done with another provider.  If not, she will need this eventually to guide her on her proper dosage.

## 2017-10-05 NOTE — Telephone Encounter (Signed)
Spoke with patient, advised as seen below per Dr. Quincy Simmonds. Patient states she does not have PCP, if Vit D was checked it would be with Dr. Jana Hakim. Offered lab appt for Vit D, patient declined. Patient request this be drawn at next AEX 02/14/18.   Advised patient would update Dr. Quincy Simmonds and return call with any additional recommendations, patient is agreeable.   Routing to provider for final review. Patient is agreeable to disposition. Will close encounter.

## 2017-10-23 ENCOUNTER — Ambulatory Visit (HOSPITAL_COMMUNITY): Payer: BLUE CROSS/BLUE SHIELD | Attending: Cardiology

## 2017-10-23 ENCOUNTER — Ambulatory Visit (INDEPENDENT_AMBULATORY_CARE_PROVIDER_SITE_OTHER): Payer: BLUE CROSS/BLUE SHIELD

## 2017-10-23 ENCOUNTER — Other Ambulatory Visit: Payer: Self-pay

## 2017-10-23 DIAGNOSIS — R002 Palpitations: Secondary | ICD-10-CM | POA: Diagnosis not present

## 2017-10-24 ENCOUNTER — Telehealth: Payer: Self-pay | Admitting: Cardiovascular Disease

## 2017-10-24 NOTE — Telephone Encounter (Signed)
Returning your call. °

## 2017-10-31 NOTE — Telephone Encounter (Signed)
Patient reviewed results in mychart, 10/24/18 at 7:09p. Returned call to patient to verify.  Patient had received the results. No further questions regarding echocardiogram  She was, however, concerned about her event monitor. She was to wear a 14-day event monitor, but only has enough materials to get her through 12 days. Patient has had difficulty with sensitive skin in response to the adhesive on the strips. Patient reports she has had several episodes of the palpitations described to Dr C at her office visit on 10/04/17 and marked the symptoms using the event monitor.  I advised the patient to continue to wear the monitor as long as supplies she currently has will last her and return the monitor.

## 2018-02-14 ENCOUNTER — Encounter: Payer: Self-pay | Admitting: Obstetrics and Gynecology

## 2018-02-14 ENCOUNTER — Ambulatory Visit (INDEPENDENT_AMBULATORY_CARE_PROVIDER_SITE_OTHER): Payer: BLUE CROSS/BLUE SHIELD | Admitting: Obstetrics and Gynecology

## 2018-02-14 ENCOUNTER — Other Ambulatory Visit (HOSPITAL_COMMUNITY)
Admission: RE | Admit: 2018-02-14 | Discharge: 2018-02-14 | Disposition: A | Discharge status: Home / Self Care | Payer: BLUE CROSS/BLUE SHIELD | Source: Ambulatory Visit | Attending: Obstetrics and Gynecology | Admitting: Obstetrics and Gynecology

## 2018-02-14 ENCOUNTER — Other Ambulatory Visit: Payer: Self-pay

## 2018-02-14 VITALS — BP 120/70 | HR 68 | Resp 16 | Ht 67.0 in | Wt 168.0 lb

## 2018-02-14 DIAGNOSIS — Z01419 Encounter for gynecological examination (general) (routine) without abnormal findings: Secondary | ICD-10-CM | POA: Insufficient documentation

## 2018-02-14 NOTE — Patient Instructions (Signed)

## 2018-02-14 NOTE — Progress Notes (Signed)
52 y.o. G86P2002 Married Caucasian female here for annual exam.    Noticing some white discharge.  No itching or burning or odor.  No recent abx.   No vaginal bleeding.   Really wants a pap.  Worried about cancer.   Saw cardiology for palpitations and was dx with PVCs.  Granduated form her cancer care with Dr. Jana Hakim.  PCP: No PCP     Patient's last menstrual period was 03/20/2006 (approximate).           Sexually active: Yes.    The current method of family planning is post menopausal status.    Exercising: Yes.    walking Smoker:  no  Health Maintenance: Pap:  05/28/17 ASCUS.Neg HR HPV History of abnormal Pap:  Yes.  See above.  MMG:  05-30-17 Diag.Rt.and Rt.Br.U/Sfor Br.pain/Neg/BiRads2--Pt.had screening MMG 08-15-17. 08-09-16 Density B/Neg/BiRads/TBC -- see Epic Colonoscopy:  ? 2016 1 small polyp with Dr. Cristina Gong. Next due 2019/2020 BMD:   09/25/17  Result  Osteopenia TDaP:  08/12/14 Gardasil:   n/a HIV: donated blood about 25 years ago Hep C: donated blood about 25 years ago Screening Labs:  Discuss today   reports that she has never smoked. She has never used smokeless tobacco. She reports that she does not drink alcohol or use drugs.  Past Medical History:  Diagnosis Date  . Atypical squamous cell changes of undetermined significance (ASCUS) on cervical cytology with negative high risk human papilloma virus (HPV) test result 2018  . Cancer ([place]) 02/22/2006   rt breast cancer  . Crohn's disease [facility]) dx 2004  Crohn's ileitis   04-14-2003 resection terminal ileum, cecum, and appendix  . History of breast cancer oncologist-  dr Jana Hakim---  no recurrence (lov 11-29-2016)   dx 04/ 2007 --  Right breast carcinoma , invasive DCIS, Stage IIB, Grade 2 (T1c N1) ER/PR positive, HER-2 negative--  03-12-2006  s/p  right lumpectomy w/ sln bx's and dissection --  adjuvant chemo and radiation therapy completed 01/ 2008-- completed tamoxifen 01/ 2018  . History of cancer  chemotherapy completed 01/ 2008   right breast  . History of genital warts    as teenager--  per pt frozen and did not return  . Osteopenia 2018  . Personal history of chemotherapy   . Personal history of radiation therapy completed 01/ 2008   right breast  . PMB (postmenopausal bleeding) 06/07/2017  . Shingles     Past Surgical History:  Procedure Laterality Date  . BREAST LUMPECTOMY Right   . COLONOSCOPY    . D & C HYSTEROSCOPIC RESECTION POLYP  11-29-2009   dr Joan Flores  . HYSTEROSCOPY W/D&C [name] 06/14/2017   Procedure: DILATATION AND CURETTAGE /HYSTEROSCOPY;  Surgeon: Nunzio Cobbs, MD;  Location: [facility];  Service: Gynecology;  Laterality: [name];  . LAPAROSCOPIC CHOLECYSTECTOMY  09/16/2002  . PORT-A-CATH REMOVAL  01/03/2007  . RESECTION TERMINAL ILEUM, CECUM, AND APPENDIX  04-14-2003   dr Excell Seltzer   Crohn's ileitis  . RIGHT BREAST LUMPECTOMY W/ MULTIPLE AXILLARY SENTINAL NODE BX'S AND DISSECTION  03/12/2006  . TRANSTHORACIC ECHOCARDIOGRAM  04/05/2006   ef 55-60%/  trivial MR and TR    Current Outpatient Medications  Medication Sig Dispense Refill  . cholecalciferol (VITAMIN D) 400 UNITS TABS Take 5,000 Units by mouth daily.    Marland Kitchen glucosamine-chondroitin 500-400 MG tablet Take 1 tablet by mouth daily.    . Multiple Vitamin (MULTIVITAMIN) tablet Take 1 tablet by mouth daily.    Marland Kitchen  tiZANidine (ZANAFLEX) 4 MG tablet as needed.  1   No current facility-administered medications for this visit.     Family History  Problem Relation Age of Onset  . Heart attack Father   . Hypertension Maternal Grandmother   . Breast cancer Neg Hx     ROS:  Pertinent items are noted in HPI.  Otherwise, a comprehensive ROS was negative.  Exam:   BP 120/70 (BP Location: Left Arm, Patient Position: Sitting, Cuff Size: Normal)   Pulse 68   Resp 16   Ht _0  (1.702 m)   Wt 168 lb (76.2 kg)   LMP 03/20/2006 (Approximate)   BMI 26.31 kg/m     General appearance:  alert, cooperative and appears stated age Head: Normocephalic, without obvious abnormality, atraumatic Neck: no adenopathy, supple, symmetrical, trachea midline and thyroid normal to inspection and palpation Lungs: clear to auscultation bilaterally Breasts: left - normal appearance, no masses or tenderness, No nipple retraction or dimpling, No nipple discharge or bleeding, No axillary or supraclavicular adenopathy. Right with scars consistent wit prior surgery, no nodes, retractions, or nipple discharge.  Heart: regular rate and rhythm Abdomen: soft, non-tender; no masses, no organomegaly Extremities: extremities normal, atraumatic, no cyanosis or edema Skin: Skin color, texture, turgor normal. No rashes or lesions Lymph nodes: Cervical, supraclavicular, and axillary nodes normal. No abnormal inguinal nodes palpated Neurologic: Grossly normal  Pelvic: External genitalia:  no lesions              Urethra:  normal appearing urethra with no masses, tenderness or lesions              Bartholins and Skenes: normal                 Vagina: normal appearing vagina with normal color and discharge, no lesions              Cervix: no lesions              Pap taken: Yes.   Bimanual Exam:  Uterus:  normal size, contour, position, consistency, mobility, non-tender              Adnexa: no mass, fullness, tenderness              Rectal exam: Yes.  .  Confirms.              Anus:  normal sphincter tone, no lesions  Chaperone was present for exam.  Assessment:   Well woman visit with normal exam. Hx ASCUS and negative HR HPV.  Osteopenia.  Hx right breast cancer.  Hx partial colectomy.  Hx shingles.   Plan: Mammogram screening discussed. Recommended self breast awareness. Pap and HR HPV as above. Vaginitis testing from pap. Guidelines for Calcium, Vitamin D, regular exercise program including cardiovascular and weight bearing exercise. Cholesterol, TSH, vit D. Declines Shingrix. Follow up  annually and prn.   After visit summary provided.

## 2018-02-15 LAB — LIPID PANEL
Chol/HDL Ratio: 2.6 ratio (ref 0.0–4.4)
Cholesterol, Total: 161 mg/dL (ref 100–199)
HDL: 63 mg/dL (ref >39)
LDL Calculated: 65 mg/dL (ref 0–99)
Triglycerides: 164 mg/dL — ABNORMAL HIGH (ref 0–149)
VLDL Cholesterol Cal: 33 mg/dL (ref 5–40)

## 2018-02-15 LAB — TSH: TSH: 2.47 u[IU]/mL (ref 0.450–4.500)

## 2018-02-15 LAB — VITAMIN D 25 HYDROXY (VIT D DEFICIENCY, FRACTURES): Vit D, 25-Hydroxy: 40.5 ng/mL (ref 30.0–100.0)

## 2018-02-18 LAB — CYTOLOGY - PAP
Bacterial vaginitis: POSITIVE — AB
Candida vaginitis: NEGATIVE
Diagnosis: NEGATIVE
HPV: NOT DETECTED
Trichomonas: NEGATIVE

## 2018-02-20 ENCOUNTER — Telehealth: Payer: Self-pay

## 2018-02-20 MED ORDER — METRONIDAZOLE 500 MG PO TABS: 500.0000 mg | ORAL_TABLET | Freq: Two times a day (BID) | ORAL | 0 refills | Status: DC

## 2018-02-20 NOTE — Telephone Encounter (Signed)
-----   Message from Nunzio Cobbs, MD sent at 02/19/2018  3:12 PM EDT ----- Please inform of her pap showing bacterial vaginosis. She reported vaginal discharge at her office visit, so we did this vaginitis check. She may treat with Flagyl 500 mg po bid for 7 days or Metrogel pv at hs for 5 nights.  Please send Rx to pharmacy of choice. ETOH precautions.   Her pap otherwise showed normal cells and negative HR HPV.  Recall - 02.

## 2018-02-20 NOTE — Telephone Encounter (Signed)
Spoke with patient. Results given. Rx for Flagyl 500 mg po BID x 7 days #14 0RF sent to pharmacy on file. Avoid alcohol during treatment and 24 hours after completing medication. Don't mix with alcohol if mixed can cause severe nausea, vomiting and abdominal cramping. Patient verbalizes understanding. 02 recall placed. Encounter closed.

## 2018-05-02 DIAGNOSIS — K121 Other forms of stomatitis: Secondary | ICD-10-CM | POA: Insufficient documentation

## 2018-05-02 DIAGNOSIS — K219 Gastro-esophageal reflux disease without esophagitis: Secondary | ICD-10-CM | POA: Diagnosis not present

## 2018-05-02 DIAGNOSIS — K148 Other diseases of tongue: Secondary | ICD-10-CM | POA: Insufficient documentation

## 2018-05-02 DIAGNOSIS — K5090 Crohn's disease, unspecified, without complications: Secondary | ICD-10-CM | POA: Diagnosis not present

## 2018-05-30 DIAGNOSIS — K219 Gastro-esophageal reflux disease without esophagitis: Secondary | ICD-10-CM | POA: Diagnosis not present

## 2018-05-31 ENCOUNTER — Other Ambulatory Visit: Payer: Self-pay | Admitting: Oncology

## 2018-05-31 DIAGNOSIS — Z1231 Encounter for screening mammogram for malignant neoplasm of breast: Secondary | ICD-10-CM

## 2018-08-19 ENCOUNTER — Ambulatory Visit
Admission: RE | Admit: 2018-08-19 | Discharge: 2018-08-19 | Disposition: A | Discharge status: Home / Self Care | Payer: BLUE CROSS/BLUE SHIELD | Source: Ambulatory Visit | Attending: Oncology | Admitting: Oncology

## 2018-08-19 DIAGNOSIS — Z1231 Encounter for screening mammogram for malignant neoplasm of breast: Secondary | ICD-10-CM | POA: Diagnosis not present

## 2018-11-07 DIAGNOSIS — H10023 Other mucopurulent conjunctivitis, bilateral: Secondary | ICD-10-CM | POA: Diagnosis not present

## 2018-11-15 DIAGNOSIS — H0100A Unspecified blepharitis right eye, upper and lower eyelids: Secondary | ICD-10-CM | POA: Diagnosis not present

## 2018-11-15 DIAGNOSIS — H0100B Unspecified blepharitis left eye, upper and lower eyelids: Secondary | ICD-10-CM | POA: Diagnosis not present

## 2018-11-15 DIAGNOSIS — H1033 Unspecified acute conjunctivitis, bilateral: Secondary | ICD-10-CM | POA: Diagnosis not present

## 2018-11-28 DIAGNOSIS — H1033 Unspecified acute conjunctivitis, bilateral: Secondary | ICD-10-CM | POA: Diagnosis not present

## 2018-11-28 DIAGNOSIS — H04123 Dry eye syndrome of bilateral lacrimal glands: Secondary | ICD-10-CM | POA: Diagnosis not present

## 2018-11-28 DIAGNOSIS — H01001 Unspecified blepharitis right upper eyelid: Secondary | ICD-10-CM | POA: Diagnosis not present

## 2018-11-28 DIAGNOSIS — H01004 Unspecified blepharitis left upper eyelid: Secondary | ICD-10-CM | POA: Diagnosis not present

## 2018-12-26 ENCOUNTER — Other Ambulatory Visit: Payer: Self-pay

## 2018-12-26 ENCOUNTER — Emergency Department (HOSPITAL_COMMUNITY)
Admission: EM | Admit: 2018-12-26 | Discharge: 2018-12-26 | Disposition: A | Discharge status: Home / Self Care | Payer: BLUE CROSS/BLUE SHIELD | Attending: Emergency Medicine | Admitting: Emergency Medicine

## 2018-12-26 DIAGNOSIS — Z79899 Other long term (current) drug therapy: Secondary | ICD-10-CM | POA: Diagnosis not present

## 2018-12-26 DIAGNOSIS — R1013 Epigastric pain: Secondary | ICD-10-CM | POA: Insufficient documentation

## 2018-12-26 DIAGNOSIS — R112 Nausea with vomiting, unspecified: Secondary | ICD-10-CM | POA: Insufficient documentation

## 2018-12-26 DIAGNOSIS — Z853 Personal history of malignant neoplasm of breast: Secondary | ICD-10-CM | POA: Diagnosis not present

## 2018-12-26 DIAGNOSIS — Z9221 Personal history of antineoplastic chemotherapy: Secondary | ICD-10-CM | POA: Insufficient documentation

## 2018-12-26 DIAGNOSIS — R1084 Generalized abdominal pain: Secondary | ICD-10-CM | POA: Diagnosis not present

## 2018-12-26 DIAGNOSIS — Z209 Contact with and (suspected) exposure to unspecified communicable disease: Secondary | ICD-10-CM | POA: Insufficient documentation

## 2018-12-26 DIAGNOSIS — Z923 Personal history of irradiation: Secondary | ICD-10-CM | POA: Insufficient documentation

## 2018-12-26 DIAGNOSIS — K5090 Crohn's disease, unspecified, without complications: Secondary | ICD-10-CM | POA: Diagnosis not present

## 2018-12-26 DIAGNOSIS — R0902 Hypoxemia: Secondary | ICD-10-CM | POA: Diagnosis not present

## 2018-12-26 DIAGNOSIS — R197 Diarrhea, unspecified: Secondary | ICD-10-CM | POA: Insufficient documentation

## 2018-12-26 DIAGNOSIS — I959 Hypotension, unspecified: Secondary | ICD-10-CM | POA: Diagnosis not present

## 2018-12-26 LAB — CBC
HCT: 43.2 % (ref 36.0–46.0)
Hemoglobin: 14.5 g/dL (ref 12.0–15.0)
MCH: 31.4 pg (ref 26.0–34.0)
MCHC: 33.6 g/dL (ref 30.0–36.0)
MCV: 93.5 fL (ref 80.0–100.0)
Platelets: 158 10*3/uL (ref 150–400)
RBC: 4.62 MIL/uL (ref 3.87–5.11)
RDW: 12.4 % (ref 11.5–15.5)
WBC: 6.6 10*3/uL (ref 4.0–10.5)
nRBC: 0 % (ref 0.0–0.2)

## 2018-12-26 LAB — COMPREHENSIVE METABOLIC PANEL
ALT: 19 U/L (ref 0–44)
AST: 26 U/L (ref 15–41)
Albumin: 4.3 g/dL (ref 3.5–5.0)
Alkaline Phosphatase: 78 U/L (ref 38–126)
Anion gap: 12 (ref 5–15)
BUN: 16 mg/dL (ref 6–20)
CO2: 23 mmol/L (ref 22–32)
Calcium: 9.8 mg/dL (ref 8.9–10.3)
Chloride: 107 mmol/L (ref 98–111)
Creatinine, Ser: 0.93 mg/dL (ref 0.44–1.00)
GFR calc Af Amer: >60 mL/min (ref >60)
GFR calc non Af Amer: >60 mL/min (ref >60)
Glucose, Bld: 128 mg/dL — ABNORMAL HIGH (ref 70–99)
Potassium: 4 mmol/L (ref 3.5–5.1)
Sodium: 142 mmol/L (ref 135–145)
Total Bilirubin: 2.1 mg/dL — ABNORMAL HIGH (ref 0.3–1.2)
Total Protein: 7.2 g/dL (ref 6.5–8.1)

## 2018-12-26 LAB — LIPASE, BLOOD: Lipase: 31 U/L (ref 11–51)

## 2018-12-26 MED ORDER — LACTATED RINGERS IV BOLUS
1000.0000 mL | Freq: Once | INTRAVENOUS | Status: AC
  Administered 2018-12-26: 1000 mL via INTRAVENOUS

## 2018-12-26 MED ORDER — LACTATED RINGERS IV BOLUS
500.0000 mL | Freq: Once | INTRAVENOUS | Status: AC
  Administered 2018-12-26: 500 mL via INTRAVENOUS

## 2018-12-26 MED ORDER — ONDANSETRON HCL 4 MG PO TABS: 4.0000 mg | ORAL_TABLET | Freq: Four times a day (QID) | ORAL | 0 refills | Status: DC

## 2018-12-26 MED ORDER — ONDANSETRON 4 MG PO TBDP
4.0000 mg | ORAL_TABLET | Freq: Once | ORAL | Status: AC | PRN
  Administered 2018-12-26: 4 mg via ORAL
  Filled 2018-12-26: qty 1

## 2018-12-26 NOTE — ED Provider Notes (Signed)
[facility] EMERGENCY DEPARTMENT Provider Note   CSN: [id number] Arrival date & time: 12/26/18  2010   History   Chief Complaint Chief Complaint  Patient presents with  . Abdominal Pain    HPI Janice [name] is a 53 y.o. female.  HPI   Janice [name] is a 52 y.o. female with PMH of right-sided breast cancer status post right lumpectomy and chemotherapy/radiation, Crohn's disease status post resection of terminal ileum, cecum, and cholecystectomy, shingles, osteopenia who presents with, vomiting, and diarrhea.  Patient states that she has had 10+ episodes of watery diarrhea without blood or mucus per day for the last 3 days.  Developed nausea with about 6 or 7 episodes of nonbloody nonbilious emesis without frank coffee-ground emesis starting this afternoon.  No fevers or chills.  No recent falls or trauma.  Does not feel that this is related to Crohn's disease flares that she has had in the past.  Feels better after receiving Zofran and IV fluid by EMS prior to arrival.  Last surgery for Crohn's disease was in the early 2000s.  In remission from breast cancer and has not been on chemotherapy for the past 2 to 3 years.  Mild epigastric pain earlier during episodes of emesis which has resolved.  No urinary symptoms.  Reports that her father was recently diagnosed with pneumonia and lives in an assisted-living facility where a "GI bug" has been going around. She has been in close contact with him recently.  Past Medical History:  Diagnosis Date  . Atypical squamous cell changes of undetermined significance (ASCUS) on cervical cytology with negative high risk human papilloma virus (HPV) test result 2018  . Cancer ([place]) 02/22/2006   rt breast cancer  . Crohn's disease [facility]) dx 2004  Crohn's ileitis   04-14-2003 resection terminal ileum, cecum, and appendix  . History of breast cancer oncologist-  dr Jana Hakim---  no recurrence (lov 11-29-2016)   dx 04/ 2007 --  Right breast  carcinoma , invasive DCIS, Stage IIB, Grade 2 (T1c N1) ER/PR positive, HER-2 negative--  03-12-2006  s/p  right lumpectomy w/ sln bx's and dissection --  adjuvant chemo and radiation therapy completed 01/ 2008-- completed tamoxifen 01/ 2018  . History of cancer chemotherapy completed 01/ 2008   right breast  . History of genital warts    as teenager--  per pt frozen and did not return  . Osteopenia 2018  . Personal history of chemotherapy   . Personal history of radiation therapy completed 01/ 2008   right breast  . PMB (postmenopausal bleeding) 06/07/2017  . Shingles     Patient Active Problem List   Diagnosis Date Noted  . Malignant neoplasm of upper-outer quadrant of right breast in female, estrogen receptor positive ([place]) 09/05/2017  . Breast cancer, right breast ([place]) 03/08/2015  . Dysuria 03/04/2014    Past Surgical History:  Procedure Laterality Date  . BREAST LUMPECTOMY Right   . COLONOSCOPY    . D & C HYSTEROSCOPIC RESECTION POLYP  11-29-2009   dr Joan Flores  . HYSTEROSCOPY W/D&C [name] 06/14/2017   Procedure: DILATATION AND CURETTAGE /HYSTEROSCOPY;  Surgeon: Nunzio Cobbs, MD;  Location: [facility];  Service: Gynecology;  Laterality: [name];  . LAPAROSCOPIC CHOLECYSTECTOMY  09/16/2002  . PORT-A-CATH REMOVAL  01/03/2007  . RESECTION TERMINAL ILEUM, CECUM, AND APPENDIX  04-14-2003   dr Excell Seltzer   Crohn's ileitis  . RIGHT BREAST LUMPECTOMY W/ MULTIPLE AXILLARY SENTINAL NODE BX'S AND  DISSECTION  03/12/2006  . TRANSTHORACIC ECHOCARDIOGRAM  04/05/2006   ef 55-60%/  trivial MR and TR     OB History    Gravida  2   Para  2   Term  2   Preterm      AB      Living  2     SAB      TAB      Ectopic      Multiple      Live Births               Home Medications    Prior to Admission medications   Medication Sig Start Date End Date Taking? Authorizing Provider  BIOTIN PO Take 1 tablet by mouth daily.   Yes [provider]    cholecalciferol (VITAMIN D) 400 UNITS TABS Take 5,000 Units by mouth daily.   Yes [provider]  Cholecalciferol (VITAMIN D-3) 125 MCG (5000 UT) TABS Take 1 tablet by mouth See admin instructions. Monday through Friday   Yes [provider]  Multiple Vitamin (MULTIVITAMIN) tablet Take 1 tablet by mouth daily.   Yes [provider]  vitamin B-12 (CYANOCOBALAMIN) 100 MCG tablet Take 100 mcg by mouth daily.   Yes [provider]  ondansetron (ZOFRAN) 4 MG tablet Take 1 tablet (4 mg total) by mouth every 6 (six) hours. 12/26/18   , Rodena Goldmann, MD    Family History Family History  Problem Relation Age of Onset  . Heart attack Father   . Hypertension Maternal Grandmother   . Breast cancer Neg Hx     Social History Social History   Tobacco Use  . Smoking status: Never Smoker  . Smokeless tobacco: Never Used  Substance Use Topics  . Alcohol use: No    Alcohol/week: 0.0 standard drinks  . Drug use: No     Allergies   Oxycodone   Review of Systems Review of Systems  Constitutional: Negative for chills and fever.  HENT: Negative for ear pain and sore throat.   Eyes: Negative for pain and visual disturbance.  Respiratory: Negative for cough and shortness of breath.   Cardiovascular: Negative for chest pain and palpitations.  Gastrointestinal: Positive for abdominal pain, diarrhea, nausea and vomiting.  Genitourinary: Negative for dysuria and hematuria.  Musculoskeletal: Negative for arthralgias and back pain.  Skin: Negative for color change and rash.  Neurological: Negative for seizures and syncope.  All other systems reviewed and are negative.    Physical Exam Updated Vital Signs BP 111/66   Pulse (!) 104   Temp 97.9 F (36.6 C) (Oral)   Resp (!) 34   Ht 5' 7" (1.702 m)   Wt 79.4 kg   LMP 03/20/2006 (Approximate)   SpO2 100%   BMI 27.41 kg/m   Physical Exam Vitals signs and nursing note reviewed.  Constitutional:       General: She is not in acute distress.    Appearance: Normal appearance. She is well-developed. She is not diaphoretic.  HENT:     Head: Normocephalic and atraumatic.     Mouth/Throat:     Lips: Pink.     Mouth: Mucous membranes are dry.  Eyes:     Conjunctiva/sclera: Conjunctivae normal.  Neck:     Musculoskeletal: Neck supple.  Cardiovascular:     Rate and Rhythm: Normal rate and regular rhythm.     Heart sounds: No murmur.  Pulmonary:     Effort: Pulmonary effort is  normal. No respiratory distress.     Breath sounds: Normal breath sounds.  Abdominal:     General: Abdomen is flat.     Palpations: Abdomen is soft.     Tenderness: There is no abdominal tenderness. There is no guarding or rebound. Negative signs include Murphy's sign, Rovsing's sign and McBurney's sign.  Skin:    General: Skin is warm and dry.  Neurological:     Mental Status: She is alert.  Psychiatric:        Behavior: Behavior is cooperative.      ED Treatments / Results  Labs (all labs ordered are listed, but only abnormal results are displayed) Labs Reviewed  COMPREHENSIVE METABOLIC PANEL - Abnormal; Notable for the following components:      Result Value   Glucose, Bld 128 (*)    Total Bilirubin 2.1 (*)    All other components within normal limits  LIPASE, BLOOD  CBC  URINALYSIS, ROUTINE W REFLEX MICROSCOPIC    EKG None  Radiology No results found.  Procedures Procedures (including critical care time)  Medications Ordered in ED Medications  ondansetron (ZOFRAN-ODT) disintegrating tablet 4 mg (4 mg Oral Given 12/26/18 2022)  lactated ringers bolus 1,000 mL (0 mLs Intravenous Stopped 12/26/18 2117)  lactated ringers bolus 500 mL (0 mLs Intravenous Stopped 12/26/18 2333)     Initial Impression / Assessment and Plan / ED Course  I have reviewed the triage vital signs and the nursing notes.  Pertinent labs & imaging results that were available during my care of the patient were reviewed by  me and considered in my medical decision making (see chart for details).      MDM:  Imaging: None indicated at this time.  ED Provider Interpretation of EKG: None indicated at this time.  Labs: CBC normal.  CMP with total bilirubin of 2.1 otherwise normal, lipase 31.  On initial evaluation, patient appears dehydrated and slightly uncomfortable. Afebrile and hemodynamically stable. Alert and oriented x4, pleasant, and cooperative.  Patient presents with nausea, vomiting, and diarrhea as detailed above.  On exam, patient has no tenderness to palpation.  Dry mucous membranes.  Appears volume down overall with dry lips.  No right upper quadrant pain subjectively and patient is status post cholecystectomy.  No jaundice and only minimal elevation of T bili which she has had in the past.  No fever.  Low suspicion for choledocholithiasis or cholangitis.  No pain at McBurney's point and no Rovsing sign.  Doubt appendicitis.  No pelvic complaints including vaginal discharge or bleeding.  No suprapubic pain.  No history of diverticulosis or diverticulitis on prior colonoscopy and no pain in the left lower quadrant.  Doubt the same at this time.  Denies urinary symptoms and given her history of diarrhea feel that urinary source is unlikely.  Low suspicion for pyelonephritis.  Labs are unremarkable otherwise as above without evidence for pancreatitis.  No electrolyte abnormality.  Blood counts normal.  Given a total of 1.5 L of IV lactated Ringer's and received 500 mL of IV normal saline by EMS prior to arrival.  Given Zofran in the ED additionally for nausea with no recurrence of nausea or emesis.   Low suspicion for Crohn's flare at this time.  Overall suspect likely viral gastroenteritis without evidence for significant intra-abdominal pathology.  Patient was hungry and would like to eat and drink and be discharged home.  She was agreeable with follow-up as an outpatient was discharged home with a  prescription for  Zofran.  Counseled on PCP follow-up and given strict return precautions.  The plan for this patient was discussed with Dr. Gilford Raid who voiced agreement and who oversaw evaluation and treatment of this patient.   The patient was fully informed and involved with the history taking, evaluation, workup including labs/images, and plan. The patient's concerns and questions were addressed to the patient's satisfaction and she expressed agreement with the plan to DC home.    Final Clinical Impressions(s) / ED Diagnoses   Final diagnoses:  Nausea vomiting and diarrhea    ED Discharge Orders         Ordered    ondansetron (ZOFRAN) 4 MG tablet  Every 6 hours     12/26/18 2319           , Rodena Goldmann, MD 12/26/18 7425    Isla Pence, MD 12/26/18 [phone]

## 2018-12-26 NOTE — ED Triage Notes (Signed)
Per pt she has been having abdominal pain since 3pm today with N/V and some diarrhea. Pt hx of chromes disease. Pt has chills no fever. Father in a facility and GI bug has been going through the facility. Was given 4 mg of Zofran 500 bolus. 120/58, p 88, rr 16, 96 ra, cgb 111

## 2019-01-20 ENCOUNTER — Other Ambulatory Visit: Payer: Self-pay | Admitting: Obstetrics and Gynecology

## 2019-01-20 DIAGNOSIS — Z1231 Encounter for screening mammogram for malignant neoplasm of breast: Secondary | ICD-10-CM

## 2019-03-12 ENCOUNTER — Ambulatory Visit: Payer: BLUE CROSS/BLUE SHIELD | Admitting: Obstetrics and Gynecology

## 2019-04-19 DIAGNOSIS — R300 Dysuria: Secondary | ICD-10-CM | POA: Diagnosis not present

## 2019-04-19 DIAGNOSIS — N3000 Acute cystitis without hematuria: Secondary | ICD-10-CM | POA: Diagnosis not present

## 2019-06-19 DIAGNOSIS — M1812 Unilateral primary osteoarthritis of first carpometacarpal joint, left hand: Secondary | ICD-10-CM | POA: Diagnosis not present

## 2019-06-19 DIAGNOSIS — M65331 Trigger finger, right middle finger: Secondary | ICD-10-CM | POA: Diagnosis not present

## 2019-07-04 ENCOUNTER — Ambulatory Visit: Payer: BLUE CROSS/BLUE SHIELD | Admitting: Obstetrics and Gynecology

## 2019-08-11 ENCOUNTER — Other Ambulatory Visit: Payer: Self-pay

## 2019-08-13 ENCOUNTER — Encounter: Payer: Self-pay | Admitting: Obstetrics and Gynecology

## 2019-08-13 ENCOUNTER — Other Ambulatory Visit: Payer: Self-pay

## 2019-08-13 ENCOUNTER — Ambulatory Visit (INDEPENDENT_AMBULATORY_CARE_PROVIDER_SITE_OTHER): Payer: BLUE CROSS/BLUE SHIELD | Admitting: Obstetrics and Gynecology

## 2019-08-13 VITALS — BP 142/88 | HR 80 | Temp 97.7°F | Resp 12 | Ht 66.5 in | Wt 169.6 lb

## 2019-08-13 DIAGNOSIS — E781 Pure hyperglyceridemia: Secondary | ICD-10-CM

## 2019-08-13 DIAGNOSIS — Z01419 Encounter for gynecological examination (general) (routine) without abnormal findings: Secondary | ICD-10-CM

## 2019-08-13 DIAGNOSIS — E28319 Asymptomatic premature menopause: Secondary | ICD-10-CM

## 2019-08-13 NOTE — Progress Notes (Signed)
53 y.o. G73P2002 Married Caucasian female here for annual exam.    Denies vaginal bleeding.  Denies bladder problems.   Parents are in assisted living.   PCP: No PCP     Patient's last menstrual period was 03/20/2006 (approximate).           Sexually active: Yes.    The current method of family planning is post menopausal status.    Exercising: Yes.    walking Smoker:  no  Health Maintenance: Pap:  02/14/18 Neg:Neg HR HPV History of abnormal Pap:  Yes, 05/28/17 ASCUS:Neg HR HPV.  Pap 08/25/15 - normal, neg HR HPV. MMG:  08/19/18 BIRADS 1 negative/density b -- scheduled 08/21/19 Colonoscopy:  2016 1 small polyp with Dr. Cristina Gong. BMD:   09/25/17  Result  Osteopenia TDaP:  08/12/14 Gardasil:   n/a HIV and Hep C: patient has donated blood in the past Screening Labs:  Discuss today   reports that she has never smoked. She has never used smokeless tobacco. She reports that she does not drink alcohol or use drugs.  Past Medical History:  Diagnosis Date  . Atypical squamous cell changes of undetermined significance (ASCUS) on cervical cytology with negative high risk human papilloma virus (HPV) test result 2018  . Cancer ([place]) 02/22/2006   rt breast cancer  . Crohn's disease [facility]) dx 2004  Crohn's ileitis   04-14-2003 resection terminal ileum, cecum, and appendix  . History of breast cancer oncologist-  dr Jana Hakim---  no recurrence (lov 11-29-2016)   dx 04/ 2007 --  Right breast carcinoma , invasive DCIS, Stage IIB, Grade 2 (T1c N1) ER/PR positive, HER-2 negative--  03-12-2006  s/p  right lumpectomy w/ sln bx's and dissection --  adjuvant chemo and radiation therapy completed 01/ 2008-- completed tamoxifen 01/ 2018  . History of cancer chemotherapy completed 01/ 2008   right breast  . History of genital warts    as teenager--  per pt frozen and did not return  . Osteopenia 2018  . Personal history of chemotherapy   . Personal history of radiation therapy completed 01/ 2008   right  breast  . PMB (postmenopausal bleeding) 06/07/2017  . Shingles     Past Surgical History:  Procedure Laterality Date  . BREAST LUMPECTOMY Right   . COLONOSCOPY    . D & C HYSTEROSCOPIC RESECTION POLYP  11-29-2009   dr Joan Flores  . HYSTEROSCOPY W/D&C [name] 06/14/2017   Procedure: DILATATION AND CURETTAGE /HYSTEROSCOPY;  Surgeon: Nunzio Cobbs, MD;  Location[facility];  Service: Gynecology;  Laterality: [name];  . LAPAROSCOPIC CHOLECYSTECTOMY  09/16/2002  . PORT-A-CATH REMOVAL  01/03/2007  . RESECTION TERMINAL ILEUM, CECUM, AND APPENDIX  04-14-2003   dr Excell Seltzer   Crohn's ileitis  . RIGHT BREAST LUMPECTOMY W/ MULTIPLE AXILLARY SENTINAL NODE BX'S AND DISSECTION  03/12/2006  . TRANSTHORACIC ECHOCARDIOGRAM  04/05/2006   ef 55-60%/  trivial MR and TR    Current Outpatient Medications  Medication Sig Dispense Refill  . BIOTIN PO Take 1 tablet by mouth daily.    . cholecalciferol (VITAMIN D) 400 UNITS TABS Take 5,000 Units by mouth daily.    . Cholecalciferol (VITAMIN D-3) 125 MCG (5000 UT) TABS Take 1 tablet by mouth See admin instructions. Monday through Friday    . Multiple Vitamin (MULTIVITAMIN) tablet Take 1 tablet by mouth daily.    . vitamin B-12 (CYANOCOBALAMIN) 100 MCG tablet Take 100 mcg by mouth daily.     No current facility-administered  medications for this visit.     Family History  Problem Relation Age of Onset  . Heart attack Father   . Hypertension Maternal Grandmother   . Breast cancer Neg Hx     Review of Systems  Constitutional: Negative.   HENT: Negative.   Eyes: Negative.   Respiratory: Negative.   Cardiovascular: Negative.   Gastrointestinal: Negative.   Endocrine: Negative.   Genitourinary: Negative.   Musculoskeletal: Negative.   Skin: Negative.   Allergic/Immunologic: Negative.   Neurological: Negative.   Hematological: Negative.   Psychiatric/Behavioral: Negative.     Exam:   BP (!) 142/88 (BP Location: Left Arm, Patient  Position: Sitting, Cuff Size: Normal)   Pulse 80   Temp 97.7 F (36.5 C) (Temporal)   Resp 12   Ht 5' 6.5" (1.689 m)   Wt 169 lb 9.6 oz (76.9 kg)   LMP 03/20/2006 (Approximate)   BMI 26.96 kg/m     General appearance: alert, cooperative and appears stated age Head: normocephalic, without obvious abnormality, atraumatic Neck: no adenopathy, supple, symmetrical, trachea midline and thyroid normal to inspection and palpation Lungs: clear to auscultation bilaterally Breasts: right breast scar, no masses or tenderness, No nipple retraction or dimpling, No nipple discharge or bleeding, No axillary adenopathy Left breast normal appearance, no masses or tenderness, No nipple retraction or dimpling, No nipple discharge or bleeding, No axillary adenopathy Heart: regular rate and rhythm Abdomen: soft, non-tender; no masses, no organomegaly Extremities: extremities normal, atraumatic, no cyanosis or edema Skin: skin color, texture, turgor normal. No rashes or lesions Lymph nodes: cervical, supraclavicular, and axillary nodes normal. Neurologic: grossly normal  Pelvic: External genitalia:  no lesions              No abnormal inguinal nodes palpated.              Urethra:  normal appearing urethra with no masses, tenderness or lesions              Bartholins and Skenes: normal                 Vagina: normal appearing vagina with normal color and discharge, no lesions              Cervix: no lesions              Pap taken: No. Bimanual Exam:  Uterus:  normal size, contour, position, consistency, mobility, non-tender              Adnexa: no mass, fullness, tenderness              Rectal exam: Yes.  .  Confirms.              Anus:  normal sphincter tone, no lesions  Chaperone was present for exam.  Assessment:   Well woman visit with normal exam. Hx ASCUS and negative HR HPV.  Osteopenia.  Early menopause.  Hx right breast cancer.  Hx partial colectomy.  Hx shingles.   Plan: Mammogram  screening discussed. Self breast awareness reviewed. Pap and HR HPV as above. Guidelines for Calcium, Vitamin D, regular exercise program including cardiovascular and weight bearing exercise. 3 year risk of CIN 3 is 0.14%.   Flu vaccine recommended.  Routine labs.  She will monitor her BP at home and see a PCP if it is consistently >= 140/90.  BMD ordered. Follow up annually and prn.   After visit summary provided.

## 2019-08-13 NOTE — Patient Instructions (Signed)

## 2019-08-14 LAB — CBC
Hematocrit: 40.1 % (ref 34.0–46.6)
Hemoglobin: 13.7 g/dL (ref 11.1–15.9)
MCH: 32.2 pg (ref 26.6–33.0)
MCHC: 34.2 g/dL (ref 31.5–35.7)
MCV: 94 fL (ref 79–97)
Platelets: 198 10*3/uL (ref 150–450)
RBC: 4.25 x10E6/uL (ref 3.77–5.28)
RDW: 12.6 % (ref 11.7–15.4)
WBC: 5.5 10*3/uL (ref 3.4–10.8)

## 2019-08-14 LAB — COMPREHENSIVE METABOLIC PANEL
ALT: 13 IU/L (ref 0–32)
AST: 16 IU/L (ref 0–40)
Albumin/Globulin Ratio: 1.8 (ref 1.2–2.2)
Albumin: 4.6 g/dL (ref 3.8–4.9)
Alkaline Phosphatase: 100 IU/L (ref 39–117)
BUN/Creatinine Ratio: 12 (ref 9–23)
BUN: 9 mg/dL (ref 6–24)
Bilirubin Total: 0.5 mg/dL (ref 0.0–1.2)
CO2: 24 mmol/L (ref 20–29)
Calcium: 9.7 mg/dL (ref 8.7–10.2)
Chloride: 104 mmol/L (ref 96–106)
Creatinine, Ser: 0.76 mg/dL (ref 0.57–1.00)
GFR calc Af Amer: 103 mL/min/{1.73_m2} (ref >59)
GFR calc non Af Amer: 89 mL/min/{1.73_m2} (ref >59)
Globulin, Total: 2.5 g/dL (ref 1.5–4.5)
Glucose: 93 mg/dL (ref 65–99)
Potassium: 3.9 mmol/L (ref 3.5–5.2)
Sodium: 141 mmol/L (ref 134–144)
Total Protein: 7.1 g/dL (ref 6.0–8.5)

## 2019-08-14 LAB — LIPID PANEL
Chol/HDL Ratio: 2.8 ratio (ref 0.0–4.4)
Cholesterol, Total: 182 mg/dL (ref 100–199)
HDL: 65 mg/dL (ref >39)
LDL Chol Calc (NIH): 78 mg/dL (ref 0–99)
Triglycerides: 238 mg/dL — ABNORMAL HIGH (ref 0–149)
VLDL Cholesterol Cal: 39 mg/dL (ref 5–40)

## 2019-08-16 ENCOUNTER — Encounter: Payer: Self-pay | Admitting: Obstetrics and Gynecology

## 2019-08-16 NOTE — Addendum Note (Signed)
Addended by: Yisroel Ramming, Dietrich Pates E on: 08/16/2019 08:04 AM   Modules accepted: Orders

## 2019-08-20 DIAGNOSIS — H01001 Unspecified blepharitis right upper eyelid: Secondary | ICD-10-CM | POA: Diagnosis not present

## 2019-08-20 DIAGNOSIS — H524 Presbyopia: Secondary | ICD-10-CM | POA: Diagnosis not present

## 2019-08-20 DIAGNOSIS — H5213 Myopia, bilateral: Secondary | ICD-10-CM | POA: Diagnosis not present

## 2019-08-21 ENCOUNTER — Ambulatory Visit
Admission: RE | Admit: 2019-08-21 | Discharge: 2019-08-21 | Disposition: A | Discharge status: Home / Self Care | Payer: BC Managed Care – PPO | Source: Ambulatory Visit | Attending: Obstetrics and Gynecology | Admitting: Obstetrics and Gynecology

## 2019-08-21 ENCOUNTER — Other Ambulatory Visit: Payer: Self-pay

## 2019-08-21 DIAGNOSIS — Z1231 Encounter for screening mammogram for malignant neoplasm of breast: Secondary | ICD-10-CM | POA: Diagnosis not present

## 2019-08-21 HISTORY — DX: Malignant neoplasm of unspecified site of unspecified female breast: C50.919

## 2019-09-08 ENCOUNTER — Other Ambulatory Visit (INDEPENDENT_AMBULATORY_CARE_PROVIDER_SITE_OTHER): Payer: BC Managed Care – PPO

## 2019-09-08 DIAGNOSIS — E781 Pure hyperglyceridemia: Secondary | ICD-10-CM | POA: Diagnosis not present

## 2019-09-09 LAB — LIPID PANEL
Chol/HDL Ratio: 2.8 ratio (ref 0.0–4.4)
Cholesterol, Total: 161 mg/dL (ref 100–199)
HDL: 58 mg/dL (ref >39)
LDL Chol Calc (NIH): 75 mg/dL (ref 0–99)
Triglycerides: 168 mg/dL — ABNORMAL HIGH (ref 0–149)
VLDL Cholesterol Cal: 28 mg/dL (ref 5–40)

## 2019-10-09 DIAGNOSIS — M65331 Trigger finger, right middle finger: Secondary | ICD-10-CM | POA: Diagnosis not present

## 2019-10-09 DIAGNOSIS — M1812 Unilateral primary osteoarthritis of first carpometacarpal joint, left hand: Secondary | ICD-10-CM | POA: Diagnosis not present

## 2019-12-04 DIAGNOSIS — M65331 Trigger finger, right middle finger: Secondary | ICD-10-CM | POA: Diagnosis not present

## 2019-12-23 DIAGNOSIS — R143 Flatulence: Secondary | ICD-10-CM | POA: Diagnosis not present

## 2019-12-23 DIAGNOSIS — R152 Fecal urgency: Secondary | ICD-10-CM | POA: Diagnosis not present

## 2019-12-23 DIAGNOSIS — K5000 Crohn's disease of small intestine without complications: Secondary | ICD-10-CM | POA: Diagnosis not present

## 2019-12-23 DIAGNOSIS — K591 Functional diarrhea: Secondary | ICD-10-CM | POA: Diagnosis not present

## 2020-02-09 ENCOUNTER — Encounter: Payer: Self-pay | Admitting: Certified Nurse Midwife

## 2020-02-10 DIAGNOSIS — Z1159 Encounter for screening for other viral diseases: Secondary | ICD-10-CM | POA: Diagnosis not present

## 2020-02-12 ENCOUNTER — Other Ambulatory Visit: Payer: Self-pay | Admitting: Obstetrics and Gynecology

## 2020-02-12 DIAGNOSIS — Z1231 Encounter for screening mammogram for malignant neoplasm of breast: Secondary | ICD-10-CM

## 2020-02-13 DIAGNOSIS — K5000 Crohn's disease of small intestine without complications: Secondary | ICD-10-CM | POA: Diagnosis not present

## 2020-02-13 DIAGNOSIS — Z980 Intestinal bypass and anastomosis status: Secondary | ICD-10-CM | POA: Diagnosis not present

## 2020-02-13 DIAGNOSIS — R197 Diarrhea, unspecified: Secondary | ICD-10-CM | POA: Diagnosis not present

## 2020-02-13 DIAGNOSIS — K529 Noninfective gastroenteritis and colitis, unspecified: Secondary | ICD-10-CM | POA: Diagnosis not present

## 2020-04-20 DIAGNOSIS — L813 Cafe au lait spots: Secondary | ICD-10-CM | POA: Diagnosis not present

## 2020-04-20 DIAGNOSIS — L819 Disorder of pigmentation, unspecified: Secondary | ICD-10-CM | POA: Diagnosis not present

## 2020-04-20 DIAGNOSIS — L918 Other hypertrophic disorders of the skin: Secondary | ICD-10-CM | POA: Diagnosis not present

## 2020-04-20 DIAGNOSIS — D1801 Hemangioma of skin and subcutaneous tissue: Secondary | ICD-10-CM | POA: Diagnosis not present

## 2020-07-08 ENCOUNTER — Encounter: Payer: Self-pay | Admitting: Genetic Counselor

## 2020-08-23 ENCOUNTER — Other Ambulatory Visit: Payer: Self-pay

## 2020-08-23 ENCOUNTER — Ambulatory Visit
Admission: RE | Admit: 2020-08-23 | Discharge: 2020-08-23 | Disposition: A | Discharge status: Home / Self Care | Payer: BC Managed Care – PPO | Source: Ambulatory Visit | Attending: Obstetrics and Gynecology | Admitting: Obstetrics and Gynecology

## 2020-08-23 DIAGNOSIS — Z1231 Encounter for screening mammogram for malignant neoplasm of breast: Secondary | ICD-10-CM | POA: Diagnosis not present

## 2020-09-27 NOTE — Progress Notes (Signed)
54 y.o. G53P2002 Married Caucasian female here for annual exam.    Denies vaginal bleeding, discharge, bladder or bowel concerns.  No vaginal dryness.   Hair thinning.  Has a dermatologist.   Considering abdominal plastic surgery.   Father passed in November.  Mother is at [facility].   Not vaccinated against Covid.   PCP:   None  Patient's last menstrual period was 03/20/2006 (approximate).           Sexually active: Yes.    The current method of family planning is post menopausal status.    Exercising: Yes.    walking Smoker:  no  Health Maintenance: Pap: 02/14/18 Neg:Neg HR HPV, 05-28-17 ASCUS:Neg HR HPV, 08-25-15 Neg:Neg HR HPV History of abnormal Pap:  Yes, 05/28/17 ASCUS:Neg HR HPV MMG: 08-23-20 3D/Neg/density B/BiRads2 Colonoscopy: 2021 normal   BMD: 09-25-17  Result :Osteopenia TDaP:  08-12-14 Gardasil:   no JAS:NKNLZJQ blood in the past Hep C:donated blood in the past Screening Labs:  Today.    reports that she has never smoked. She has never used smokeless tobacco. She reports that she does not drink alcohol and does not use drugs.  Past Medical History:  Diagnosis Date  . Atypical squamous cell changes of undetermined significance (ASCUS) on cervical cytology with negative high risk human papilloma virus (HPV) test result 2018  . Breast cancer ([place])    right  . Cancer ([place]) 02/22/2006   rt breast cancer  . Crohn's disease [facility]) dx 2004  Crohn's ileitis   04-14-2003 resection terminal ileum, cecum, and appendix  . History of breast cancer oncologist-  dr Jana Hakim---  no recurrence (lov 11-29-2016)   dx 04/ 2007 --  Right breast carcinoma , invasive DCIS, Stage IIB, Grade 2 (T1c N1) ER/PR positive, HER-2 negative--  03-12-2006  s/p  right lumpectomy w/ sln bx's and dissection --  adjuvant chemo and radiation therapy completed 01/ 2008-- completed tamoxifen 01/ 2018  . History of cancer chemotherapy completed 01/ 2008   right breast  . History of genital warts    as  teenager--  per pt frozen and did not return  . Hypertriglyceridemia   . Osteopenia 2018  . Personal history of chemotherapy   . Personal history of radiation therapy completed 01/ 2008   right breast  . PMB (postmenopausal bleeding) 06/07/2017  . Shingles     Past Surgical History:  Procedure Laterality Date  . BREAST LUMPECTOMY Right   . COLONOSCOPY    . D & C HYSTEROSCOPIC RESECTION POLYP  11-29-2009   dr Joan Flores  . HYSTEROSCOPY WITH D & C [name] 06/14/2017   Procedure: DILATATION AND CURETTAGE /HYSTEROSCOPY;  Surgeon: Nunzio Cobbs, MD;  Location: [facility];  Service: Gynecology;  Laterality: [name];  . LAPAROSCOPIC CHOLECYSTECTOMY  09/16/2002  . PORT-A-CATH REMOVAL  01/03/2007  . RESECTION TERMINAL ILEUM, CECUM, AND APPENDIX  04-14-2003   dr Excell Seltzer   Crohn's ileitis  . RIGHT BREAST LUMPECTOMY W/ MULTIPLE AXILLARY SENTINAL NODE BX'S AND DISSECTION  03/12/2006  . TRANSTHORACIC ECHOCARDIOGRAM  04/05/2006   ef 55-60%/  trivial MR and TR    Current Outpatient Medications  Medication Sig Dispense Refill  . BIOTIN PO Take 1 tablet by mouth daily.    . Cholecalciferol (VITAMIN D-3) 125 MCG (5000 UT) TABS Take 1 tablet by mouth See admin instructions. Monday through Friday    . Multiple Vitamin (MULTIVITAMIN) tablet Take 1 tablet by mouth daily.    . Probiotic Product (ALIGN)  4 MG CAPS Take 1 capsule by mouth daily.    . vitamin B-12 (CYANOCOBALAMIN) 100 MCG tablet Take 100 mcg by mouth daily.     No current facility-administered medications for this visit.    Family History  Problem Relation Age of Onset  . Heart attack Father   . Pneumonia Father        covid 34  . Dementia Father   . Dementia Mother   . Hypertension Maternal Grandmother   . Breast cancer Neg Hx     Review of Systems  All other systems reviewed and are negative.   Exam:   BP 138/80   Pulse 74   Ht 5' 7" (1.702 m)   Wt 162 lb (73.5 kg)   LMP 03/20/2006 (Approximate)   SpO2  96%   BMI 25.37 kg/m     General appearance: alert, cooperative and appears stated age Head: normocephalic, without obvious abnormality, atraumatic Neck: no adenopathy, supple, symmetrical, trachea midline and thyroid normal to inspection and palpation Lungs: clear to auscultation bilaterally Breasts: right breast - scar noted, no masses or tenderness, No nipple retraction or dimpling, No nipple discharge or bleeding, No axillary adenopathy Left breast - normal appearance, no masses or tenderness, No nipple retraction or dimpling, No nipple discharge or bleeding, No axillary adenopathy Heart: regular rate and rhythm Abdomen: soft, non-tender; no masses, no organomegaly Extremities: extremities normal, atraumatic, no cyanosis or edema Skin: skin color, texture, turgor normal. No rashes or lesions Lymph nodes: cervical, supraclavicular, and axillary nodes normal. Neurologic: grossly normal  Pelvic: External genitalia:  no lesions              No abnormal inguinal nodes palpated.              Urethra:  normal appearing urethra with no masses, tenderness or lesions              Bartholins and Skenes: normal                 Vagina: normal appearing vagina with normal color and discharge, no lesions              Cervix: no lesions              Pap taken: No. Bimanual Exam:  Uterus:  normal size, contour, position, consistency, mobility, non-tender              Adnexa: no mass, fullness, tenderness              Rectal exam: Yes.  .  Confirms.              Anus:  normal sphincter tone, no lesions  Chaperone was present for exam.  Assessment:   Well woman visit with normal exam. Hx ASCUS and negative HR HPV.  Osteopenia.  Early menopause.  Hx right breast cancer.  Hx partial colectomy.  Hx shingles.  Plan: Mammogram screening discussed. Self breast awareness reviewed. Pap and HR HPV in 2024.  5 year risk of CIN 3 is 0.14%. Guidelines for Calcium, Vitamin D, regular exercise program  including cardiovascular and weight bearing exercise. BMD ordered at [facility].   Patient will call to schedule.  Routine labs. Follow up annually and prn.

## 2020-09-29 ENCOUNTER — Ambulatory Visit (INDEPENDENT_AMBULATORY_CARE_PROVIDER_SITE_OTHER): Payer: BC Managed Care – PPO | Admitting: Obstetrics and Gynecology

## 2020-09-29 ENCOUNTER — Encounter: Payer: Self-pay | Admitting: Obstetrics and Gynecology

## 2020-09-29 ENCOUNTER — Other Ambulatory Visit: Payer: Self-pay

## 2020-09-29 VITALS — BP 138/80 | HR 74 | Ht 67.0 in | Wt 162.0 lb

## 2020-09-29 DIAGNOSIS — Z01419 Encounter for gynecological examination (general) (routine) without abnormal findings: Secondary | ICD-10-CM

## 2020-09-29 DIAGNOSIS — H524 Presbyopia: Secondary | ICD-10-CM | POA: Diagnosis not present

## 2020-09-29 DIAGNOSIS — H5213 Myopia, bilateral: Secondary | ICD-10-CM | POA: Diagnosis not present

## 2020-09-29 DIAGNOSIS — H01001 Unspecified blepharitis right upper eyelid: Secondary | ICD-10-CM | POA: Diagnosis not present

## 2020-09-29 DIAGNOSIS — Z780 Asymptomatic menopausal state: Secondary | ICD-10-CM | POA: Diagnosis not present

## 2020-09-29 DIAGNOSIS — H04123 Dry eye syndrome of bilateral lacrimal glands: Secondary | ICD-10-CM | POA: Diagnosis not present

## 2020-09-29 LAB — HM DIABETES EYE EXAM

## 2020-09-29 NOTE — Patient Instructions (Signed)

## 2020-09-30 LAB — CBC
Hematocrit: 40.9 % (ref 34.0–46.6)
Hemoglobin: 13.9 g/dL (ref 11.1–15.9)
MCH: 32.4 pg (ref 26.6–33.0)
MCHC: 34 g/dL (ref 31.5–35.7)
MCV: 95 fL (ref 79–97)
Platelets: 189 10*3/uL (ref 150–450)
RBC: 4.29 x10E6/uL (ref 3.77–5.28)
RDW: 12.4 % (ref 11.7–15.4)
WBC: 5.2 10*3/uL (ref 3.4–10.8)

## 2020-09-30 LAB — COMPREHENSIVE METABOLIC PANEL
ALT: 10 IU/L (ref 0–32)
AST: 17 IU/L (ref 0–40)
Albumin/Globulin Ratio: 2.3 — ABNORMAL HIGH (ref 1.2–2.2)
Albumin: 4.8 g/dL (ref 3.8–4.9)
Alkaline Phosphatase: 85 IU/L (ref 44–121)
BUN/Creatinine Ratio: 16 (ref 9–23)
BUN: 10 mg/dL (ref 6–24)
Bilirubin Total: 0.8 mg/dL (ref 0.0–1.2)
CO2: 27 mmol/L (ref 20–29)
Calcium: 9.7 mg/dL (ref 8.7–10.2)
Chloride: 102 mmol/L (ref 96–106)
Creatinine, Ser: 0.63 mg/dL (ref 0.57–1.00)
GFR calc Af Amer: 117 mL/min/{1.73_m2} (ref >59)
GFR calc non Af Amer: 101 mL/min/{1.73_m2} (ref >59)
Globulin, Total: 2.1 g/dL (ref 1.5–4.5)
Glucose: 88 mg/dL (ref 65–99)
Potassium: 3.8 mmol/L (ref 3.5–5.2)
Sodium: 140 mmol/L (ref 134–144)
Total Protein: 6.9 g/dL (ref 6.0–8.5)

## 2020-09-30 LAB — LIPID PANEL
Chol/HDL Ratio: 2.6 ratio (ref 0.0–4.4)
Cholesterol, Total: 172 mg/dL (ref 100–199)
HDL: 67 mg/dL (ref >39)
LDL Chol Calc (NIH): 82 mg/dL (ref 0–99)
Triglycerides: 134 mg/dL (ref 0–149)
VLDL Cholesterol Cal: 23 mg/dL (ref 5–40)

## 2020-09-30 LAB — VITAMIN D 25 HYDROXY (VIT D DEFICIENCY, FRACTURES): Vit D, 25-Hydroxy: 46.2 ng/mL (ref 30.0–100.0)

## 2020-10-12 ENCOUNTER — Ambulatory Visit (INDEPENDENT_AMBULATORY_CARE_PROVIDER_SITE_OTHER): Payer: Self-pay | Admitting: Plastic Surgery

## 2020-10-12 ENCOUNTER — Other Ambulatory Visit: Payer: Self-pay

## 2020-10-12 ENCOUNTER — Encounter: Payer: Self-pay | Admitting: Plastic Surgery

## 2020-10-12 VITALS — BP 148/85 | HR 73 | Temp 98.7°F | Ht 66.0 in | Wt 163.2 lb

## 2020-10-12 DIAGNOSIS — C50411 Malignant neoplasm of upper-outer quadrant of right female breast: Secondary | ICD-10-CM

## 2020-10-12 DIAGNOSIS — Z719 Counseling, unspecified: Secondary | ICD-10-CM

## 2020-10-12 DIAGNOSIS — C50911 Malignant neoplasm of unspecified site of right female breast: Secondary | ICD-10-CM

## 2020-10-12 DIAGNOSIS — Z170 Estrogen receptor positive status [ER+]: Secondary | ICD-10-CM

## 2020-10-12 NOTE — Progress Notes (Signed)
Patient ID: Janice [name], female    DOB: [birth year], 54 y.o.   MRN: [id number]   Chief Complaint  Patient presents with  . Skin Problem    The patient is a 54 year old female here for evaluation of her abdomen.  The patient has a history of Crohn's disease and a cholecystectomy.  She also had breast cancer in 2007.  She had radiation and chemotherapy to the right breast.  She had a portion of her colon resected due to her Crohn's disease in 2004.  She does not have diabetes and is not on a blood thinner. She is 5 feet 6 inches tall and weighs 163 pounds.   She has some laxity skin with a little bit of excess fat on the abdominal area.  She does not have a hernia or diastases.  She is interested in a possible abdominoplasty and liposuction.  Possibly liposuction of the thighs as well.  She is a little bit nervous about surgery due to her Crohn's disease so she wants to be cautious.    Review of Systems  Constitutional: Negative.   HENT: Negative.   Eyes: Negative.   Respiratory: Negative.   Cardiovascular: Negative.   Gastrointestinal: Negative.   Endocrine: Negative.   Genitourinary: Negative.   Musculoskeletal: Negative.   Hematological: Negative.   Psychiatric/Behavioral: Negative.     Past Medical History:  Diagnosis Date  . Atypical squamous cell changes of undetermined significance (ASCUS) on cervical cytology with negative high risk human papilloma virus (HPV) test result 2018  . Breast cancer ([place])    right  . Cancer ([place]) 02/22/2006   rt breast cancer  . Crohn's disease [facility]) dx 2004  Crohn's ileitis   04-14-2003 resection terminal ileum, cecum, and appendix  . History of breast cancer oncologist-  dr Jana Hakim---  no recurrence (lov 11-29-2016)   dx 04/ 2007 --  Right breast carcinoma , invasive DCIS, Stage IIB, Grade 2 (T1c N1) ER/PR positive, HER-2 negative--  03-12-2006  s/p  right lumpectomy w/ sln bx's and dissection --  adjuvant chemo and radiation therapy  completed 01/ 2008-- completed tamoxifen 01/ 2018  . History of cancer chemotherapy completed 01/ 2008   right breast  . History of genital warts    as teenager--  per pt frozen and did not return  . Hypertriglyceridemia   . Osteopenia 2018  . Personal history of chemotherapy   . Personal history of radiation therapy completed 01/ 2008   right breast  . PMB (postmenopausal bleeding) 06/07/2017  . Shingles     Past Surgical History:  Procedure Laterality Date  . BREAST LUMPECTOMY Right   . COLONOSCOPY    . D & C HYSTEROSCOPIC RESECTION POLYP  11-29-2009   dr Joan Flores  . HYSTEROSCOPY WITH D & C [name] 06/14/2017   Procedure: DILATATION AND CURETTAGE /HYSTEROSCOPY;  Surgeon: Nunzio Cobbs, MD;  Location: [facility];  Service: Gynecology;  Laterality: [name];  . LAPAROSCOPIC CHOLECYSTECTOMY  09/16/2002  . PORT-A-CATH REMOVAL  01/03/2007  . RESECTION TERMINAL ILEUM, CECUM, AND APPENDIX  04-14-2003   dr Excell Seltzer   Crohn's ileitis  . RIGHT BREAST LUMPECTOMY W/ MULTIPLE AXILLARY SENTINAL NODE BX'S AND DISSECTION  03/12/2006  . TRANSTHORACIC ECHOCARDIOGRAM  04/05/2006   ef 55-60%/  trivial MR and TR      Current Outpatient Medications:  .  BIOTIN PO, Take 1 tablet by mouth daily., Disp: , Rfl:  .  Cholecalciferol (VITAMIN D-3) 125 MCG (  5000 UT) TABS, Take 1 tablet by mouth See admin instructions. Monday through Friday, Disp: , Rfl:  .  hyoscyamine (LEVSIN) 0.125 MG tablet, Take 0.125 mg by mouth every 4 (four) hours as needed., Disp: , Rfl:  .  Multiple Vitamin (MULTIVITAMIN) tablet, Take 1 tablet by mouth daily., Disp: , Rfl:  .  Probiotic Product (ALIGN) 4 MG CAPS, Take 1 capsule by mouth daily., Disp: , Rfl:  .  vitamin B-12 (CYANOCOBALAMIN) 100 MCG tablet, Take 100 mcg by mouth daily. (Patient not taking: Reported on 10/12/2020), Disp: , Rfl:    Objective:   Vitals:   10/12/20 1415  BP: (!) 148/85  Pulse: 73  Temp: 98.7 F (37.1 C)  SpO2: 99%    Physical  Exam Vitals and nursing note reviewed.  Constitutional:      Appearance: Normal appearance.  HENT:     Head: Normocephalic and atraumatic.  Cardiovascular:     Rate and Rhythm: Normal rate.     Pulses: Normal pulses.  Pulmonary:     Effort: Pulmonary effort is normal.  Abdominal:     General: Abdomen is flat. There is no distension.  Neurological:     General: No focal deficit present.     Mental Status: She is alert and oriented to person, place, and time.  Psychiatric:        Mood and Affect: Mood normal.        Behavior: Behavior normal.        Thought Content: Thought content normal.     Assessment & Plan:  Malignant neoplasm of upper-outer quadrant of right breast in female, estrogen receptor positive ([place])  Malignant neoplasm of right breast in female, estrogen receptor positive, unspecified site of breast ([place])  Encounter for counseling  We discussed options for abdominoplasty and liposuction.  We will provide her with a quote and give her time to think about it.  She knows to call with any questions or concerns.  Pictures were obtained of the patient and placed in the chart with the patient's or guardian's permission.  [place], DO

## 2020-10-19 ENCOUNTER — Encounter: Payer: Self-pay | Admitting: Plastic Surgery

## 2020-10-19 DIAGNOSIS — Z719 Counseling, unspecified: Secondary | ICD-10-CM | POA: Insufficient documentation

## 2020-10-22 DIAGNOSIS — K5000 Crohn's disease of small intestine without complications: Secondary | ICD-10-CM | POA: Diagnosis not present

## 2020-10-22 DIAGNOSIS — K591 Functional diarrhea: Secondary | ICD-10-CM | POA: Diagnosis not present

## 2020-11-02 DIAGNOSIS — Z719 Counseling, unspecified: Secondary | ICD-10-CM

## 2020-11-15 ENCOUNTER — Telehealth: Payer: Self-pay

## 2020-11-15 NOTE — Telephone Encounter (Signed)
Patient called to find out what type of compression garment she needs to buy for her upcoming surgery (mini abdominoplasty and liposuction - SX 12/27/2020 ) and where can she get it.  Please call.

## 2020-11-16 NOTE — Telephone Encounter (Signed)
Returned patients call. Advised her the PA will go over in detail at the Pre-OP what to expect after surgery.  She will have a binder on when she wakes up from surgery. After her first PO visit, the physician will advise when she is ready to  start to wearing a garment like Spanx for compression.  Patient wanted to go ahead and order a garment now.

## 2020-12-13 NOTE — Progress Notes (Signed)
ICD-10-CM   1. Encounter for cosmetic surgery  Z41.1       Patient ID: Janice [name], female    DOB: 11-09-1966, 55 y.o.   MRN: [id number]   History of Present Illness: Janice [name] is a 55 y.o.  female  with a history of abdominal skin laxity and excess spot.  She presents for preoperative evaluation for upcoming procedure, mini abdominoplasty with liposuction of the flanks, thighs, and mons scheduled for 12/27/2020 with Dr. Marla Roe.  Summary from previous visit: Patient is 5 feet 6 inches tall weighs 163 pounds.  She has some laxity of the skin and a little bit of excess fat on the abdominal area.  She does not have a hernia or diastases.  She is interested in abdominoplasty with liposuction as well as possible liposuction of the thighs.  She has a history of Crohn's disease and had to have a portion of her colon resected due to this in 2004.  She is a little nervous about the surgery due to her Crohn's disease and so wants to be cautious.  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   Past Medical History: Allergies: No Known Allergies  Current Medications:  Current Outpatient Medications:  .  BIOTIN PO, Take 1 tablet by mouth daily., Disp: , Rfl:  .  cephALEXin (KEFLEX) 500 MG capsule, Take 1 capsule (500 mg total) by mouth 4 (four) times daily for 3 days. For use AFTER Surgery, Disp: 12 capsule, Rfl: 0 .  Cholecalciferol (VITAMIN D-3) 125 MCG (5000 UT) TABS, Take 1 tablet by mouth See admin instructions. Monday through Friday, Disp: , Rfl:  .  HYDROcodone-acetaminophen (NORCO) 5-325 MG tablet, Take 1 tablet by mouth every 8 (eight) hours as needed for up to 7 days for severe pain. For use AFTER Surgery, Disp: 21 tablet, Rfl: 0 .  hyoscyamine (LEVSIN) 0.125 MG tablet, Take 0.125 mg by mouth every 4  (four) hours as needed., Disp: , Rfl:  .  Multiple Vitamin (MULTIVITAMIN) tablet, Take 1 tablet by mouth daily., Disp: , Rfl:  .  ondansetron (ZOFRAN-ODT) 4 MG disintegrating tablet, Take 1 tablet (4 mg total) by mouth every 8 (eight) hours as needed for nausea or vomiting., Disp: 20 tablet, Rfl: 0 .  Probiotic Product (ALIGN) 4 MG CAPS, Take 1 capsule by mouth daily., Disp: , Rfl:  .  vitamin B-12 (CYANOCOBALAMIN) 100 MCG tablet, Take 100 mcg by mouth daily., Disp: , Rfl:   Past Medical Problems: Past Medical History:  Diagnosis Date  . Atypical squamous cell changes of undetermined significance (ASCUS) on cervical cytology with negative high risk human papilloma virus (HPV) test result 2018  . Breast cancer ([place])    right  . Cancer ([place]) 02/22/2006   rt breast cancer  . Crohn's disease [facility]) dx 2004  Crohn's ileitis   04-14-2003 resection terminal ileum, cecum, and appendix  . History of breast cancer oncologist-  dr Jana Hakim---  no recurrence (lov 11-29-2016)   dx 04/ 2007 --  Right breast carcinoma , invasive DCIS, Stage IIB, Grade 2 (T1c N1) ER/PR positive, HER-2 negative--  03-12-2006  s/p  right lumpectomy w/ sln bx's and dissection --  adjuvant chemo and radiation therapy completed 01/ 2008-- completed tamoxifen 01/ 2018  . History of cancer chemotherapy completed 01/ 2008   right breast  .  History of genital warts    as teenager--  per pt frozen and did not return  . Hypertriglyceridemia   . Osteopenia 2018  . Personal history of chemotherapy   . Personal history of radiation therapy completed 01/ 2008   right breast  . PMB (postmenopausal bleeding) 06/07/2017  . Shingles     Past Surgical History: Past Surgical History:  Procedure Laterality Date  . BREAST LUMPECTOMY Right   . COLONOSCOPY    . D & C HYSTEROSCOPIC RESECTION POLYP  11-29-2009   dr Joan Flores  . HYSTEROSCOPY WITH D & C [name] 06/14/2017   Procedure: DILATATION AND CURETTAGE /HYSTEROSCOPY;  Surgeon: Nunzio Cobbs, MD;  Location: [facility];  Service: Gynecology;  Laterality: [name];  . LAPAROSCOPIC CHOLECYSTECTOMY  09/16/2002  . PORT-A-CATH REMOVAL  01/03/2007  . RESECTION TERMINAL ILEUM, CECUM, AND APPENDIX  04-14-2003   dr Excell Seltzer   Crohn's ileitis  . RIGHT BREAST LUMPECTOMY W/ MULTIPLE AXILLARY SENTINAL NODE BX'S AND DISSECTION  03/12/2006  . TRANSTHORACIC ECHOCARDIOGRAM  04/05/2006   ef 55-60%/  trivial MR and TR    Social History: Social History   Socioeconomic History  . Marital status: Married    Spouse name: Not on file  . Number of children: Not on file  . Years of education: Not on file  . Highest education level: Not on file  Occupational History  . Not on file  Tobacco Use  . Smoking status: Never Smoker  . Smokeless tobacco: Never Used  Vaping Use  . Vaping Use: Never used  Substance and Sexual Activity  . Alcohol use: No    Alcohol/week: 0.0 standard drinks  . Drug use: No  . Sexual activity: Yes    Partners: Male    Birth control/protection: Post-menopausal    Comment: husband with vasectomy  Other Topics Concern  . Not on file  Social History Narrative  . Not on file   Social Determinants of Health   Financial Resource Strain: Not on file  Food Insecurity: Not on file  Transportation Needs: Not on file  Physical Activity: Not on file  Stress: Not on file  Social Connections: Not on file  Intimate Partner Violence: Not on file    Family History: Family History  Problem Relation Age of Onset  . Heart attack Father   . Pneumonia Father        covid 39  . Dementia Father   . Dementia Mother   . Hypertension Maternal Grandmother   . Breast cancer Neg Hx     Review of Systems: Review of Systems  Constitutional: Negative for chills and fever.  HENT: Negative for congestion and sore throat.   Respiratory: Negative for cough and shortness of breath.   Cardiovascular: Negative for chest pain and palpitations.   Gastrointestinal: Negative for abdominal pain, nausea and vomiting.  Skin: Negative for itching and rash.    Physical Exam: Vital Signs BP 135/82 (BP Location: Left Arm, Patient Position: Sitting, Cuff Size: Normal)   Pulse 80   Ht 5' 7"  (1.702 m)   Wt 162 lb (73.5 kg)   LMP 03/20/2006 (Approximate)   SpO2 96%   BMI 25.37 kg/m  Physical Exam Vitals and nursing note reviewed.  Constitutional:      Appearance: Normal appearance. She is normal weight.  HENT:     Head: Normocephalic and atraumatic.  Eyes:     Extraocular Movements: Extraocular movements intact.  Cardiovascular:     Rate  and Rhythm: Normal rate and regular rhythm.     Pulses: Normal pulses.     Heart sounds: Normal heart sounds.  Pulmonary:     Effort: Pulmonary effort is normal.     Breath sounds: Normal breath sounds. No wheezing, rhonchi or rales.  Abdominal:     General: Bowel sounds are normal.     Palpations: Abdomen is soft.  Musculoskeletal:        General: No swelling. Normal range of motion.     Cervical back: Normal range of motion.  Skin:    General: Skin is warm and dry.     Coloration: Skin is not pale.     Findings: No erythema or rash.  Neurological:     General: No focal deficit present.     Mental Status: She is alert and oriented to person, place, and time.  Psychiatric:        Mood and Affect: Mood normal.        Behavior: Behavior normal.        Thought Content: Thought content normal.        Judgment: Judgment normal.     Assessment/Plan:  Ms. Geissinger scheduled for mini abdominoplasty with liposuction of flanks, thighs, and mons with Dr. Marla Roe.  Risks, benefits, and alternatives of procedure discussed, questions answered and consent obtained.    Smoking Status: non-smoker; Counseling Given? [name]  Caprini Score: High; Risk Factors include: 55 year old female, Hx cancer, BMI < 25, and length of planned surgery. Recommendation for mechanical and pharmacological prophylaxis  during surgery. Encourage early ambulation.   Pictures obtained: 10/12/20  Post-op Rx sent to pharmacy: Norco, Zofran, Keflex  Patient was provided with the General Surgical Risk consent document and Pain Medication Agreement prior to their appointment.  They had adequate time to read through the risk consent documents and Pain Medication Agreement. We also discussed them in person together during this preop appointment. All of their questions were answered to their satisfaction.  Recommended calling if they have any further questions.  Risk consent form and Pain Medication Agreement to be scanned into patient's chart.  The risks that can be encountered with and after liposuction were discussed and include the following but no limited to these:  Asymmetry, fluid accumulation, firmness of the area, fat necrosis with death of fat tissue, bleeding, infection, delayed healing, anesthesia risks, skin sensation changes, injury to structures including nerves, blood vessels, and muscles which may be temporary or permanent, allergies to tape, suture materials and glues, blood products, topical preparations or injected agents, skin and contour irregularities, skin discoloration and swelling, deep vein thrombosis, cardiac and pulmonary complications, pain, which may persist, persistent pain, recurrence of the lesion, poor healing of the incision, possible need for revisional surgery or staged procedures. Thiere can also be persistent swelling, poor wound healing, rippling or loose skin, worsening of cellulite, swelling, and thermal burn or heat injury from ultrasound with the ultrasound-assisted lipoplasty technique. Any change in weight fluctuations can alter the outcome.  The risk that can be encountered for this procedure were discussed and include the following but not limited to these: asymmetry, fluid accumulation, firmness of the tissue, skin loss, decrease or no sensation, fat necrosis, bleeding, infection,  healing delay.  Deep vein thrombosis, cardiac and pulmonary complications are risks to any procedure.  There are risks of anesthesia, changes to skin sensation and injury to nerves or blood vessels.  The muscle can be temporarily or permanently injured.  You may have an allergic  reaction to tape, suture, glue, blood products which can result in skin discoloration, swelling, pain, skin lesions, poor healing.  Any of these can lead to the need for revisonal surgery or stage procedures.  Weight gain and weigh loss can also effect the long term appearance. The results are not guaranteed to last a lifetime.  Future surgery may be required.    Electronically signed by: Threasa Heads, PA-C 12/14/2020 10:07 PM

## 2020-12-14 ENCOUNTER — Encounter: Payer: Self-pay | Admitting: Plastic Surgery

## 2020-12-14 ENCOUNTER — Ambulatory Visit (INDEPENDENT_AMBULATORY_CARE_PROVIDER_SITE_OTHER): Payer: Self-pay | Admitting: Plastic Surgery

## 2020-12-14 ENCOUNTER — Other Ambulatory Visit: Payer: Self-pay

## 2020-12-14 VITALS — BP 135/82 | HR 80 | Ht 67.0 in | Wt 162.0 lb

## 2020-12-14 DIAGNOSIS — Z719 Counseling, unspecified: Secondary | ICD-10-CM

## 2020-12-14 DIAGNOSIS — Z411 Encounter for cosmetic surgery: Secondary | ICD-10-CM

## 2020-12-14 MED ORDER — CEPHALEXIN 500 MG PO CAPS: 500.0000 mg | ORAL_CAPSULE | Freq: Four times a day (QID) | ORAL | 0 refills | Status: AC

## 2020-12-14 MED ORDER — ONDANSETRON 4 MG PO TBDP: 4.0000 mg | ORAL_TABLET | Freq: Three times a day (TID) | ORAL | 0 refills | Status: DC | PRN

## 2020-12-14 MED ORDER — HYDROCODONE-ACETAMINOPHEN 5-325 MG PO TABS: 1.0000 | ORAL_TABLET | Freq: Three times a day (TID) | ORAL | 0 refills | Status: AC | PRN

## 2020-12-21 HISTORY — PX: ABDOMINOPLASTY: SUR9

## 2020-12-27 DIAGNOSIS — Z7689 Persons encountering health services in other specified circumstances: Secondary | ICD-10-CM | POA: Diagnosis not present

## 2020-12-30 ENCOUNTER — Telehealth: Payer: Self-pay

## 2020-12-30 NOTE — Telephone Encounter (Signed)
Patient called to say that she had surgery this week with Dr. Marla Roe.  Patient said she threw up after surgery and they think she may have swallowed some of it.  Patient said they did a chest x-ray while she was there and everything was fine but they asked her to contact her doctor if she developed a fever.  Patient said that her temperature has gone up a little bit but it is still in the normal range.  Patient said that she has developed a nasty cough and is wondering if she can take Robitussin Severe Cough and Sore Throat.  If she does take it, should she stop taking some of the other medication she is on (ex. Tylenol and Advil).  Please call.

## 2020-12-30 NOTE — Telephone Encounter (Signed)
Called the patient back regarding the message below.  Informed the patient that I spoke with [facility] regarding her message and per [facility] she can take the Robitussin for the cough.  Patient verbalized understanding and agreed.//AB/CMA

## 2020-12-31 ENCOUNTER — Inpatient Hospital Stay (HOSPITAL_COMMUNITY)
Admission: EM | Admit: 2020-12-31 | Discharge: 2021-01-03 | DRG: 205 | Disposition: A | Discharge status: Home / Self Care | Payer: BC Managed Care – PPO | Attending: Internal Medicine | Admitting: Internal Medicine

## 2020-12-31 ENCOUNTER — Other Ambulatory Visit: Payer: Self-pay

## 2020-12-31 ENCOUNTER — Emergency Department (HOSPITAL_COMMUNITY): Payer: BC Managed Care – PPO

## 2020-12-31 DIAGNOSIS — Z9221 Personal history of antineoplastic chemotherapy: Secondary | ICD-10-CM

## 2020-12-31 DIAGNOSIS — J189 Pneumonia, unspecified organism: Secondary | ICD-10-CM | POA: Diagnosis not present

## 2020-12-31 DIAGNOSIS — J9589 Other postprocedural complications and disorders of respiratory system, not elsewhere classified: Secondary | ICD-10-CM | POA: Diagnosis not present

## 2020-12-31 DIAGNOSIS — A419 Sepsis, unspecified organism: Secondary | ICD-10-CM | POA: Diagnosis present

## 2020-12-31 DIAGNOSIS — Z923 Personal history of irradiation: Secondary | ICD-10-CM

## 2020-12-31 DIAGNOSIS — D649 Anemia, unspecified: Secondary | ICD-10-CM | POA: Diagnosis present

## 2020-12-31 DIAGNOSIS — I871 Compression of vein: Secondary | ICD-10-CM | POA: Diagnosis present

## 2020-12-31 DIAGNOSIS — R0602 Shortness of breath: Secondary | ICD-10-CM | POA: Diagnosis not present

## 2020-12-31 DIAGNOSIS — Y838 Other surgical procedures as the cause of abnormal reaction of the patient, or of later complication, without mention of misadventure at the time of the procedure: Secondary | ICD-10-CM | POA: Diagnosis present

## 2020-12-31 DIAGNOSIS — K5000 Crohn's disease of small intestine without complications: Secondary | ICD-10-CM | POA: Diagnosis present

## 2020-12-31 DIAGNOSIS — R000 Tachycardia, unspecified: Secondary | ICD-10-CM | POA: Diagnosis not present

## 2020-12-31 DIAGNOSIS — E781 Pure hyperglyceridemia: Secondary | ICD-10-CM | POA: Diagnosis not present

## 2020-12-31 DIAGNOSIS — R1110 Vomiting, unspecified: Secondary | ICD-10-CM | POA: Diagnosis not present

## 2020-12-31 DIAGNOSIS — Z9049 Acquired absence of other specified parts of digestive tract: Secondary | ICD-10-CM | POA: Diagnosis not present

## 2020-12-31 DIAGNOSIS — R059 Cough, unspecified: Secondary | ICD-10-CM | POA: Diagnosis not present

## 2020-12-31 DIAGNOSIS — Z170 Estrogen receptor positive status [ER+]: Secondary | ICD-10-CM | POA: Diagnosis not present

## 2020-12-31 DIAGNOSIS — E876 Hypokalemia: Secondary | ICD-10-CM | POA: Diagnosis not present

## 2020-12-31 DIAGNOSIS — C50911 Malignant neoplasm of unspecified site of right female breast: Secondary | ICD-10-CM | POA: Diagnosis present

## 2020-12-31 DIAGNOSIS — R17 Unspecified jaundice: Secondary | ICD-10-CM | POA: Diagnosis not present

## 2020-12-31 DIAGNOSIS — J9601 Acute respiratory failure with hypoxia: Secondary | ICD-10-CM | POA: Diagnosis not present

## 2020-12-31 DIAGNOSIS — Z79899 Other long term (current) drug therapy: Secondary | ICD-10-CM

## 2020-12-31 DIAGNOSIS — Z20822 Contact with and (suspected) exposure to covid-19: Secondary | ICD-10-CM | POA: Diagnosis present

## 2020-12-31 DIAGNOSIS — J690 Pneumonitis due to inhalation of food and vomit: Secondary | ICD-10-CM | POA: Diagnosis present

## 2020-12-31 DIAGNOSIS — Z853 Personal history of malignant neoplasm of breast: Secondary | ICD-10-CM

## 2020-12-31 DIAGNOSIS — C50919 Malignant neoplasm of unspecified site of unspecified female breast: Secondary | ICD-10-CM | POA: Diagnosis not present

## 2020-12-31 DIAGNOSIS — R509 Fever, unspecified: Secondary | ICD-10-CM | POA: Diagnosis not present

## 2020-12-31 LAB — COMPREHENSIVE METABOLIC PANEL
ALT: 17 U/L (ref 0–44)
AST: 21 U/L (ref 15–41)
Albumin: 3.9 g/dL (ref 3.5–5.0)
Alkaline Phosphatase: 62 U/L (ref 38–126)
Anion gap: 11 (ref 5–15)
BUN: 6 mg/dL (ref 6–20)
CO2: 26 mmol/L (ref 22–32)
Calcium: 9.5 mg/dL (ref 8.9–10.3)
Chloride: 99 mmol/L (ref 98–111)
Creatinine, Ser: 0.69 mg/dL (ref 0.44–1.00)
GFR, Estimated: >60 mL/min (ref >60)
Glucose, Bld: 127 mg/dL — ABNORMAL HIGH (ref 70–99)
Potassium: 3.2 mmol/L — ABNORMAL LOW (ref 3.5–5.1)
Sodium: 136 mmol/L (ref 135–145)
Total Bilirubin: 2 mg/dL — ABNORMAL HIGH (ref 0.3–1.2)
Total Protein: 6.7 g/dL (ref 6.5–8.1)

## 2020-12-31 LAB — URINALYSIS, ROUTINE W REFLEX MICROSCOPIC
Bilirubin Urine: NEGATIVE
Glucose, UA: NEGATIVE mg/dL
Ketones, ur: 20 mg/dL — AB
Leukocytes,Ua: NEGATIVE
Nitrite: NEGATIVE
Protein, ur: NEGATIVE mg/dL
Specific Gravity, Urine: 1.021 (ref 1.005–1.030)
pH: 5 (ref 5.0–8.0)

## 2020-12-31 LAB — CBC WITH DIFFERENTIAL/PLATELET
Abs Immature Granulocytes: 0.03 10*3/uL (ref 0.00–0.07)
Basophils Absolute: 0 10*3/uL (ref 0.0–0.1)
Basophils Relative: 0 %
Eosinophils Absolute: 0 10*3/uL (ref 0.0–0.5)
Eosinophils Relative: 0 %
HCT: 36 % (ref 36.0–46.0)
Hemoglobin: 12.2 g/dL (ref 12.0–15.0)
Immature Granulocytes: 0 %
Lymphocytes Relative: 6 %
Lymphs Abs: 0.6 10*3/uL — ABNORMAL LOW (ref 0.7–4.0)
MCH: 31.8 pg (ref 26.0–34.0)
MCHC: 33.9 g/dL (ref 30.0–36.0)
MCV: 93.8 fL (ref 80.0–100.0)
Monocytes Absolute: 0.5 10*3/uL (ref 0.1–1.0)
Monocytes Relative: 6 %
Neutro Abs: 8.6 10*3/uL — ABNORMAL HIGH (ref 1.7–7.7)
Neutrophils Relative %: 88 %
Platelets: 233 10*3/uL (ref 150–400)
RBC: 3.84 MIL/uL — ABNORMAL LOW (ref 3.87–5.11)
RDW: 12.4 % (ref 11.5–15.5)
WBC: 9.8 10*3/uL (ref 4.0–10.5)
nRBC: 0 % (ref 0.0–0.2)

## 2020-12-31 LAB — LACTIC ACID, PLASMA: Lactic Acid, Venous: 0.9 mmol/L (ref 0.5–1.9)

## 2020-12-31 LAB — PROTIME-INR
INR: 1 (ref 0.8–1.2)
Prothrombin Time: 13 seconds (ref 11.4–15.2)

## 2020-12-31 LAB — I-STAT BETA HCG BLOOD, ED (MC, WL, AP ONLY): I-stat hCG, quantitative: 8.1 m[IU]/mL — ABNORMAL HIGH (ref <5)

## 2020-12-31 MED ORDER — SODIUM CHLORIDE 0.9 % IV BOLUS
500.0000 mL | Freq: Once | INTRAVENOUS | Status: AC
  Administered 2021-01-01: 500 mL via INTRAVENOUS

## 2020-12-31 NOTE — ED Provider Notes (Signed)
[facility] EMERGENCY DEPARTMENT Provider Note   CSN: [id number] Arrival date & time: 12/31/20  2059     History Chief Complaint  Patient presents with  . Fever    Janice [name] is a 55 y.o. female with a history of breast cancer in remission, Crohn's disease, and hypertriglyceridemia who presents to the ED from [facility] for evaluation of fever that began today. Patient states she had a mini abdominoplasty/liposuction performed by Dr. Marla Roe 02/07 and as she was waking up from anesthesia was very nauseated & vomiting, there was some concern for aspiration, she was kept over night and discharged the subsequent day. She was sent home on keflex which she completed today. She has been coughing since waking up from surgery, productive with yellow mucous phlegm sputum, has some trouble breathing, hard to take a deep breath without coughing, at times chest will feel heavy. She spiked a fever this morning prompting urgent care visit and subsequent referral to the ED for further assessment. She states her abdomen is mildly sore but no significant pain. Denies subsequent vomiting, dysuria, change in stool from baseline crohns loose stools, syncope, leg pain/swelling, hemoptysis, recent long travel, hormone use, or hx of DVT/PE.       HPI     Past Medical History:  Diagnosis Date  . Atypical squamous cell changes of undetermined significance (ASCUS) on cervical cytology with negative high risk human papilloma virus (HPV) test result 2018  . Breast cancer ([place])    right  . Cancer ([place]) 02/22/2006   rt breast cancer  . Crohn's disease [facility]) dx 2004  Crohn's ileitis   04-14-2003 resection terminal ileum, cecum, and appendix  . History of breast cancer oncologist-  dr Jana Hakim---  no recurrence (lov 11-29-2016)   dx 04/ 2007 --  Right breast carcinoma , invasive DCIS, Stage IIB, Grade 2 (T1c N1) ER/PR positive, HER-2 negative--  03-12-2006  s/p  right lumpectomy w/ sln bx's and  dissection --  adjuvant chemo and radiation therapy completed 01/ 2008-- completed tamoxifen 01/ 2018  . History of cancer chemotherapy completed 01/ 2008   right breast  . History of genital warts    as teenager--  per pt frozen and did not return  . Hypertriglyceridemia   . Osteopenia 2018  . Personal history of chemotherapy   . Personal history of radiation therapy completed 01/ 2008   right breast  . PMB (postmenopausal bleeding) 06/07/2017  . Shingles     Patient Active Problem List   Diagnosis Date Noted  . Encounter for counseling 10/19/2020  . Malignant neoplasm of upper-outer quadrant of right breast in female, estrogen receptor positive ([place]) 09/05/2017  . Breast cancer, right breast ([place]) 03/08/2015  . Dysuria 03/04/2014    Past Surgical History:  Procedure Laterality Date  . BREAST LUMPECTOMY Right   . COLONOSCOPY    . D & C HYSTEROSCOPIC RESECTION POLYP  11-29-2009   dr Joan Flores  . HYSTEROSCOPY WITH D & C [name] 06/14/2017   Procedure: DILATATION AND CURETTAGE /HYSTEROSCOPY;  Surgeon: Nunzio Cobbs, MD;  Location: [facility];  Service: Gynecology;  Laterality: [name];  . LAPAROSCOPIC CHOLECYSTECTOMY  09/16/2002  . PORT-A-CATH REMOVAL  01/03/2007  . RESECTION TERMINAL ILEUM, CECUM, AND APPENDIX  04-14-2003   dr Excell Seltzer   Crohn's ileitis  . RIGHT BREAST LUMPECTOMY W/ MULTIPLE AXILLARY SENTINAL NODE BX'S AND DISSECTION  03/12/2006  . TRANSTHORACIC ECHOCARDIOGRAM  04/05/2006   ef 55-60%/  trivial MR  and TR     OB History    Gravida  2   Para  2   Term  2   Preterm      AB      Living  2     SAB      IAB      Ectopic      Multiple      Live Births              Family History  Problem Relation Age of Onset  . Heart attack Father   . Pneumonia Father        covid 15  . Dementia Father   . Dementia Mother   . Hypertension Maternal Grandmother   . Breast cancer Neg Hx     Social History   Tobacco Use  . Smoking  status: Never Smoker  . Smokeless tobacco: Never Used  Vaping Use  . Vaping Use: Never used  Substance Use Topics  . Alcohol use: No    Alcohol/week: 0.0 standard drinks  . Drug use: No    Home Medications Prior to Admission medications   Medication Sig Start Date End Date Taking? Authorizing Provider  BIOTIN PO Take 1 tablet by mouth daily.    [provider]  Cholecalciferol (VITAMIN D-3) 125 MCG (5000 UT) TABS Take 1 tablet by mouth See admin instructions. Monday through Friday    [provider]  hyoscyamine (LEVSIN) 0.125 MG tablet Take 0.125 mg by mouth every 4 (four) hours as needed.    [provider]  Multiple Vitamin (MULTIVITAMIN) tablet Take 1 tablet by mouth daily.    [provider]  ondansetron (ZOFRAN-ODT) 4 MG disintegrating tablet Take 1 tablet (4 mg total) by mouth every 8 (eight) hours as needed for nausea or vomiting. 12/14/20   Young, Venetia Night C, PA-C  Probiotic Product (ALIGN) 4 MG CAPS Take 1 capsule by mouth daily.    [provider]  vitamin B-12 (CYANOCOBALAMIN) 100 MCG tablet Take 100 mcg by mouth daily.    [provider]    Allergies    Patient has no known allergies.  Review of Systems   Review of Systems  Constitutional: Positive for fever.  HENT: Positive for congestion.   Respiratory: Positive for cough and shortness of breath.   Cardiovascular: Positive for chest pain. Negative for leg swelling.  Gastrointestinal: Negative for abdominal pain, nausea and vomiting.  Genitourinary: Negative for dysuria.  Neurological: Negative for syncope.  All other systems reviewed and are negative.   Physical Exam Updated Vital Signs BP 104/74 (BP Location: Left Arm)   Pulse (!) 103   Temp 99.1 F (37.3 C)   Resp 20   LMP 03/20/2006 (Approximate)   SpO2 93%   Physical Exam Vitals and nursing note reviewed.  Constitutional:      General: She is not in acute distress.    Appearance: She is  well-developed. She is not toxic-appearing.  HENT:     Head: Normocephalic and atraumatic.  Eyes:     General:        Right eye: No discharge.        Left eye: No discharge.     Conjunctiva/sclera: Conjunctivae normal.  Cardiovascular:     Rate and Rhythm: Regular rhythm. Tachycardia present.  Pulmonary:     Effort: No respiratory distress.     Breath sounds: Rhonchi (Right base) present. No wheezing or rales.  Abdominal:     General: There  is no distension.     Palpations: Abdomen is soft.     Tenderness: There is no abdominal tenderness. There is no guarding or rebound.     Comments: Lower abdominal surgical incision sites appear clean, dry, and intact. NO erythema, increased warmth, drainage, or induration. Bruising to sides of the abdomen and lower abdomen/suprapubic area.   Musculoskeletal:        General: No tenderness.     Cervical back: Neck supple.     Right lower leg: No edema.     Left lower leg: No edema.  Skin:    General: Skin is warm and dry.     Findings: No rash.  Neurological:     Mental Status: She is alert.     Comments: Clear speech.   Psychiatric:        Behavior: Behavior normal.     ED Results / Procedures / Treatments   Labs (all labs ordered are listed, but only abnormal results are displayed) Labs Reviewed  COMPREHENSIVE METABOLIC PANEL - Abnormal; Notable for the following components:      Result Value   Potassium 3.2 (*)    Glucose, Bld 127 (*)    Total Bilirubin 2.0 (*)    All other components within normal limits  CBC WITH DIFFERENTIAL/PLATELET - Abnormal; Notable for the following components:   RBC 3.84 (*)    Neutro Abs 8.6 (*)    Lymphs Abs 0.6 (*)    All other components within normal limits  URINALYSIS, ROUTINE W REFLEX MICROSCOPIC - Abnormal; Notable for the following components:   APPearance HAZY (*)    Hgb urine dipstick SMALL (*)    Ketones, ur 20 (*)    Bacteria, UA FEW (*)    All other components within normal limits   I-STAT BETA HCG BLOOD, ED ([facility], [facility], [facility] ONLY) - Abnormal; Notable for the following components:   I-stat hCG, quantitative 8.1 (*)    All other components within normal limits  CULTURE, BLOOD (ROUTINE X 2)  CULTURE, BLOOD (ROUTINE X 2)  LACTIC ACID, PLASMA  PROTIME-INR  LACTIC ACID, PLASMA    EKG None  Radiology DG Chest 2 View  Result Date: 12/31/2020 CLINICAL DATA:  Vomiting and fever. EXAM: CHEST - 2 VIEW COMPARISON:  December 27, 2020 FINDINGS: The heart size and mediastinal contours are within normal limits. Both lungs are clear. Surgical clips are seen along the lateral aspect of the right hemithorax and within the right upper quadrant. The visualized skeletal structures are unremarkable. IMPRESSION: No active cardiopulmonary disease. Electronically Signed   By: Virgina Norfolk M.D.   On: 12/31/2020 22:39   CT Angio Chest PE W/Cm &/Or Wo Cm  Result Date: 01/01/2021 CLINICAL DATA:  Cough, fever and tachycardia. EXAM: CT ANGIOGRAPHY CHEST WITH CONTRAST TECHNIQUE: Multidetector CT imaging of the chest was performed using the standard protocol during bolus administration of intravenous contrast. Multiplanar CT image reconstructions and MIPs were obtained to evaluate the vascular anatomy. CONTRAST:  39m OMNIPAQUE IOHEXOL 350 MG/ML SOLN COMPARISON:  Chest radiography same day FINDINGS: Cardiovascular: Heart size upper limits of normal. No coronary artery calcification is visible. No aortic atherosclerotic calcification is visible. Pulmonary arterial opacification is moderate. No visible pulmonary emboli. There is apparent stenosis of the innominate vein, with multiple collateral vessels in the superior mediastinum and neck. Mediastinum/Nodes: No mediastinal or hilar mass or lymphadenopathy. Lungs/Pleura: Bilateral lower lobe pneumonia, right more than left. Tiny amount of pleural fluid on the right. Upper Abdomen: Normal.  Previous cholecystectomy. Musculoskeletal: No significant bone finding.  Review of the MIP images confirms the above findings. IMPRESSION: 1. No pulmonary emboli. 2. Bilateral lower lobe pneumonia, right more than left. Tiny amount of pleural fluid on the right. 3. Apparent stenosis of the innominate vein, with multiple collateral vessels in the superior mediastinum and neck. Electronically Signed   By: Nelson Chimes M.D.   On: 01/01/2021 01:54    Procedures .Critical Care Performed by: Amaryllis Dyke, PA-C Authorized by: Amaryllis Dyke, PA-C     CRITICAL CARE Performed by: Kennith Maes   Total critical care time: 35 minutes  Critical care time was exclusive of separately billable procedures and treating other patients.  Critical care was necessary to treat or prevent imminent or life-threatening deterioration.  Critical care was time spent personally by me on the following activities: development of treatment plan with patient and/or surrogate as well as nursing, discussions with consultants, evaluation of patient's response to treatment, examination of patient, obtaining history from patient or surrogate, ordering and performing treatments and interventions, ordering and review of laboratory studies, ordering and review of radiographic studies, pulse oximetry and re-evaluation of patient's condition.  Medications Ordered in ED Medications  HYDROcodone-acetaminophen (NORCO/VICODIN) 5-325 MG per tablet 1 tablet (has no administration in time range)  acetaminophen (TYLENOL) tablet 650 mg (has no administration in time range)  piperacillin-tazobactam (ZOSYN) IVPB 3.375 g (has no administration in time range)  sodium chloride 0.9 % bolus 500 mL (500 mLs Intravenous New Bag/Given 01/01/21 0049)  iohexol (OMNIPAQUE) 350 MG/ML injection 80 mL (80 mLs Intravenous Contrast Given 01/01/21 8563)    ED Course  I have reviewed the triage vital signs and the nursing notes.  Pertinent labs & imaging results that were available during my care of the  patient were reviewed by me and considered in my medical decision making (see chart for details).    MDM Rules/Calculators/A&P                          Patient presents to the ED with complaints of fever that began today in the setting of postoperative coughing/dyspnea over the past 4 days with a questionable aspiration event after coming to following anesthesia.  Patient is nontoxic, her initial tachycardia is improved but remains present.  Her SPO2 is 94% on room air on my assessment.  Blood pressure within normal limits.  She does have some rhonchi at the right base, breath sounds otherwise clear.  Does not appear in respiratory distress.  Her incision sites from her surgery appear clean, dry, and intact, no significant signs of infection, and no significant abdominal tenderness.  Additional history obtained:  Additional history obtained from chart review & nursing note review.   Lab Tests:  I reviewed and interpreted labs, which included:  CBC: Grossly unremarkable, no significant leukocytosis. CMP: Hypokalemia.  Otherwise fairly unremarkable. PT/INR: Within normal limits Lactic acid: Within normal limits Urinalysis: No gross UTI.  Imaging Studies ordered:  Chest x-ray was ordered by triage, I subsequently ordered a CT angio of the chest, I independently reviewed, formal radiology impression shows: 1. No pulmonary emboli. 2. Bilateral lower lobe pneumonia, right more than left. Tiny amount of pleural fluid on the right. 3. Apparent stenosis of the innominate vein, with multiple collateral vessels in the superior mediastinum and neck  CTA negative for PE.  Surgical site incisions appeard well.  Urine without infection.  Suspect the etiology of her fever is  her bilateral pneumonia on CT.  She had a negative Covid test earlier today.  I suspect that she has aspiration pneumonia based on history provided.  Her ambulatory SPO2 was 91% on room air.  Feel she warrants admission for IV antibiotics  and further monitoring at this time.  Zosyn ordered.  BP without significant hypotension, lactic acid within normal limits, no 30 cc/kg bolus ordered at this time.  Will discuss with hospitalist service for admission.  I discussed the results and plan of care with the patient and her husband through shared decision making and they are in agreement.  03:42: CONSULT: Discussed with hospitalist Dr. Hal Hope- accepts admission.   Findings and plan of care discussed with supervising physician Dr. Stark Jock who is in agreement.    Portions of this note were generated with Lobby[employer]. Dictation errors may occur despite best attempts at proofreading.  Final Clinical Impression(s) / ED Diagnoses Final diagnoses:  Pneumonia of both lower lobes due to infectious organism    Rx / DC Orders ED Discharge Orders    None       Amaryllis Dyke, PA-C 01/01/21 0343    Veryl Speak, MD 01/01/21 [phone]

## 2020-12-31 NOTE — ED Triage Notes (Signed)
Pt presents to ED POV. Pt c/o fever. Pt reports that she had abd surgery on Monday. Pt reports emesis after anesthesia and they thought that she aspirated. Pt reports that CXR looked fine so she was d/c but told to come back if fever began. Pt see at [facility] and given tylenol. Pt taking antibiotics PO at home compliantly. Temp 101 at UC, tachycardic.

## 2021-01-01 ENCOUNTER — Encounter (HOSPITAL_COMMUNITY): Payer: Self-pay | Admitting: Internal Medicine

## 2021-01-01 ENCOUNTER — Emergency Department (HOSPITAL_COMMUNITY): Payer: BC Managed Care – PPO

## 2021-01-01 DIAGNOSIS — J189 Pneumonia, unspecified organism: Secondary | ICD-10-CM | POA: Diagnosis not present

## 2021-01-01 DIAGNOSIS — C50911 Malignant neoplasm of unspecified site of right female breast: Secondary | ICD-10-CM

## 2021-01-01 DIAGNOSIS — D649 Anemia, unspecified: Secondary | ICD-10-CM

## 2021-01-01 DIAGNOSIS — Z170 Estrogen receptor positive status [ER+]: Secondary | ICD-10-CM

## 2021-01-01 DIAGNOSIS — A419 Sepsis, unspecified organism: Secondary | ICD-10-CM

## 2021-01-01 DIAGNOSIS — J9601 Acute respiratory failure with hypoxia: Secondary | ICD-10-CM

## 2021-01-01 DIAGNOSIS — J690 Pneumonitis due to inhalation of food and vomit: Secondary | ICD-10-CM

## 2021-01-01 DIAGNOSIS — E876 Hypokalemia: Secondary | ICD-10-CM

## 2021-01-01 LAB — CBC
HCT: 31.1 % — ABNORMAL LOW (ref 36.0–46.0)
Hemoglobin: 11.1 g/dL — ABNORMAL LOW (ref 12.0–15.0)
MCH: 33.5 pg (ref 26.0–34.0)
MCHC: 35.7 g/dL (ref 30.0–36.0)
MCV: 94 fL (ref 80.0–100.0)
Platelets: 202 10*3/uL (ref 150–400)
RBC: 3.31 MIL/uL — ABNORMAL LOW (ref 3.87–5.11)
RDW: 12.5 % (ref 11.5–15.5)
WBC: 9.1 10*3/uL (ref 4.0–10.5)
nRBC: 0 % (ref 0.0–0.2)

## 2021-01-01 LAB — COMPREHENSIVE METABOLIC PANEL
ALT: 14 U/L (ref 0–44)
AST: 17 U/L (ref 15–41)
Albumin: 3.2 g/dL — ABNORMAL LOW (ref 3.5–5.0)
Alkaline Phosphatase: 57 U/L (ref 38–126)
Anion gap: 9 (ref 5–15)
BUN: 5 mg/dL — ABNORMAL LOW (ref 6–20)
CO2: 28 mmol/L (ref 22–32)
Calcium: 8.9 mg/dL (ref 8.9–10.3)
Chloride: 103 mmol/L (ref 98–111)
Creatinine, Ser: 0.64 mg/dL (ref 0.44–1.00)
GFR, Estimated: >60 mL/min (ref >60)
Glucose, Bld: 102 mg/dL — ABNORMAL HIGH (ref 70–99)
Potassium: 3.5 mmol/L (ref 3.5–5.1)
Sodium: 140 mmol/L (ref 135–145)
Total Bilirubin: 2.2 mg/dL — ABNORMAL HIGH (ref 0.3–1.2)
Total Protein: 5.9 g/dL — ABNORMAL LOW (ref 6.5–8.1)

## 2021-01-01 LAB — CREATININE, SERUM
Creatinine, Ser: 0.6 mg/dL (ref 0.44–1.00)
GFR, Estimated: >60 mL/min (ref >60)

## 2021-01-01 LAB — HIV ANTIBODY (ROUTINE TESTING W REFLEX): HIV Screen 4th Generation wRfx: NONREACTIVE

## 2021-01-01 LAB — PHOSPHORUS: Phosphorus: 3.6 mg/dL (ref 2.5–4.6)

## 2021-01-01 LAB — PREGNANCY, URINE: Preg Test, Ur: NEGATIVE

## 2021-01-01 LAB — TROPONIN I (HIGH SENSITIVITY)
Troponin I (High Sensitivity): 6 ng/L (ref <18)
Troponin I (High Sensitivity): 5 ng/L (ref <18)

## 2021-01-01 LAB — RESP PANEL BY RT-PCR (FLU A&B, COVID) ARPGX2
Influenza A by PCR: NEGATIVE
Influenza B by PCR: NEGATIVE
SARS Coronavirus 2 by RT PCR: NEGATIVE

## 2021-01-01 LAB — LACTIC ACID, PLASMA: Lactic Acid, Venous: 1 mmol/L (ref 0.5–1.9)

## 2021-01-01 LAB — MAGNESIUM: Magnesium: 2.1 mg/dL (ref 1.7–2.4)

## 2021-01-01 MED ORDER — SODIUM CHLORIDE 0.9 % IV SOLN
500.0000 mg | Freq: Every day | INTRAVENOUS | Status: DC
  Administered 2021-01-01 – 2021-01-02 (×2): 500 mg via INTRAVENOUS
  Filled 2021-01-01 (×2): qty 500
  Filled 2021-01-01: qty 25

## 2021-01-01 MED ORDER — GUAIFENESIN ER 600 MG PO TB12
1200.0000 mg | ORAL_TABLET | Freq: Two times a day (BID) | ORAL | Status: DC
  Administered 2021-01-01 – 2021-01-03 (×5): 1200 mg via ORAL
  Filled 2021-01-01 (×6): qty 2

## 2021-01-01 MED ORDER — IPRATROPIUM-ALBUTEROL 0.5-2.5 (3) MG/3ML IN SOLN: 3.0000 mL | Freq: Four times a day (QID) | RESPIRATORY_TRACT | Status: DC | PRN

## 2021-01-01 MED ORDER — ACETAMINOPHEN 325 MG PO TABS
650.0000 mg | ORAL_TABLET | Freq: Once | ORAL | Status: AC
  Administered 2021-01-01: 650 mg via ORAL
  Filled 2021-01-01: qty 2

## 2021-01-01 MED ORDER — HYDROCODONE-ACETAMINOPHEN 5-325 MG PO TABS
1.0000 | ORAL_TABLET | Freq: Four times a day (QID) | ORAL | Status: DC | PRN
Start: 2021-01-01 — End: 2021-01-03

## 2021-01-01 MED ORDER — ACETAMINOPHEN 650 MG RE SUPP: 650.0000 mg | Freq: Four times a day (QID) | RECTAL | Status: DC | PRN

## 2021-01-01 MED ORDER — SODIUM CHLORIDE 0.9 % IV SOLN
2.0000 g | Freq: Every day | INTRAVENOUS | Status: DC
  Administered 2021-01-01 – 2021-01-03 (×3): 2 g via INTRAVENOUS
  Filled 2021-01-01: qty 2
  Filled 2021-01-01: qty 0.1
  Filled 2021-01-01: qty 20

## 2021-01-01 MED ORDER — ACETAMINOPHEN 325 MG PO TABS: 650.0000 mg | ORAL_TABLET | Freq: Four times a day (QID) | ORAL | Status: DC | PRN

## 2021-01-01 MED ORDER — IOHEXOL 350 MG/ML SOLN
80.0000 mL | Freq: Once | INTRAVENOUS | Status: AC | PRN
  Administered 2021-01-01: 80 mL via INTRAVENOUS

## 2021-01-01 MED ORDER — HYDROCODONE-ACETAMINOPHEN 5-325 MG PO TABS
1.0000 | ORAL_TABLET | Freq: Once | ORAL | Status: AC
Start: 2021-01-01 — End: 2021-01-01
  Administered 2021-01-01: 1 via ORAL
  Filled 2021-01-01: qty 1

## 2021-01-01 MED ORDER — POTASSIUM CHLORIDE CRYS ER 20 MEQ PO TBCR
20.0000 meq | EXTENDED_RELEASE_TABLET | Freq: Once | ORAL | Status: AC
  Administered 2021-01-01: 20 meq via ORAL
  Filled 2021-01-01: qty 1

## 2021-01-01 MED ORDER — ENOXAPARIN SODIUM 40 MG/0.4ML ~~LOC~~ SOLN
40.0000 mg | Freq: Every day | SUBCUTANEOUS | Status: DC
  Administered 2021-01-01 – 2021-01-03 (×3): 40 mg via SUBCUTANEOUS
  Filled 2021-01-01 (×3): qty 0.4

## 2021-01-01 MED ORDER — RISAQUAD PO CAPS
1.0000 | ORAL_CAPSULE | Freq: Every day | ORAL | Status: DC
  Administered 2021-01-02 – 2021-01-03 (×2): 1 via ORAL
  Filled 2021-01-01 (×3): qty 1

## 2021-01-01 MED ORDER — VITAMIN B-12 1000 MCG PO TABS
1000.0000 ug | ORAL_TABLET | Freq: Every day | ORAL | Status: DC
  Administered 2021-01-01 – 2021-01-03 (×3): 1000 ug via ORAL
  Filled 2021-01-01 (×3): qty 1

## 2021-01-01 MED ORDER — PIPERACILLIN-TAZOBACTAM 3.375 G IVPB 30 MIN
3.3750 g | Freq: Once | INTRAVENOUS | Status: AC
  Administered 2021-01-01: 3.375 g via INTRAVENOUS
  Filled 2021-01-01: qty 50

## 2021-01-01 MED ORDER — SODIUM CHLORIDE 0.9 % IV SOLN: INTRAVENOUS | Status: DC

## 2021-01-01 NOTE — ED Notes (Signed)
I attempted to call report to floor, pt will go to [facility] when clean, I was asked to call back

## 2021-01-01 NOTE — Plan of Care (Signed)
  Problem: Activity: Goal: Ability to tolerate increased activity will improve Outcome: Progressing   Problem: Clinical Measurements: Goal: Ability to maintain a body temperature in the normal range will improve Outcome: Progressing   Problem: Respiratory: Goal: Ability to maintain adequate ventilation will improve Outcome: Progressing Goal: Ability to maintain a clear airway will improve Outcome: Progressing   Problem: Education: Goal: Knowledge of General Education information will improve Description: Including pain rating scale, medication(s)/side effects and non-pharmacologic comfort measures Outcome: Progressing   Problem: Health Behavior/Discharge Planning: Goal: Ability to manage health-related needs will improve Outcome: Progressing   Problem: Clinical Measurements: Goal: Ability to maintain clinical measurements within normal limits will improve Outcome: Progressing Goal: Will remain free from infection Outcome: Progressing Goal: Diagnostic test results will improve Outcome: Progressing Goal: Respiratory complications will improve Outcome: Progressing Goal: Cardiovascular complication will be avoided Outcome: Progressing

## 2021-01-01 NOTE — Progress Notes (Signed)
Care started prior to midnight in the emergency room and patient was admitted early this morning after midnight by Dr. Alfonso Patten shot Hal Hope and I am in current agreement with his assessment and plan.  Additional changes to the plan of care been made accordingly.  The patient is a 55 year old Caucasian female with a past history of breast cancers and is currently in remission, Crohn's disease which is also in remission as well as other comorbidities who recently had an abdominoplasty/liposuction on December 27, 2020 by Dr. Elisabeth Cara plastic surgeon.  Following her surgery patient had episodes of vomiting and has been having a persistent cough.  Patient had vomiting after anesthesia and over the last 24 hours she started developing fevers and chills and decided to directly care where she was found to have a temperature of 102.  Covid testing was negative at that time.  Started getting hypoxic on exertion and then was referred to the ER.  In the ED she underwent a CT angiogram of the chest which showed bilateral pneumonia and also was observed to be hypoxic on exertion and was admitted acute hypoxic respiratory failure as well as in the setting of possible aspiration pneumonia.  Labs also showed mild hypokalemia and repeat Covid testing was negative.  EKG shows sinus tachycardia and upon my evaluation today she is mildly tachycardic with a rate of 102.  She is currently being admitted and treated for the following but not limited to:  Acute hypoxic respiratory failure in the setting of pneumonia likely from aspiration -Placed in observation telemetry but suspect she will be changed to inpatient -SpO2: 94 % nightly but was noted to be hypoxic on ambulation and will need an ambulatory home O2 screen prior to discharge -Continuous pulse oximetry maintain O2 saturations greater 90% -Continue supplemental oxygen via nasal cannula as needed and wean down to room air as tolerated -Repeat chest x-ray in the a.m. and  follow-up on ambulatory home O2 screen  Sepsis secondary to bilateral CAP vs Aspiration, present on admission -She met sepsis criteria on admission given that she is febrile in the urgent care, tachypnea with a rate of 22, as well as a tachycardia with a rate of 125 as well as a source of infection of pneumonia -Sepsis physiology is improved -Lactic acid level was not elevated  -CXR showed "The heart size and mediastinal contours are within normal limits. Both lungs are clear. Surgical clips are seen along the lateral aspect of the right hemithorax and within the right upper quadrant. The visualized skeletal structures are unremarkable."  -CTA showed "No pulmonary emboli. Bilateral lower lobe pneumonia, right more than left. Tiny amount of pleural fluid on the right. Apparent stenosis of the innominate vein, with multiple collateral vessels in the superior mediastinum and neck.." -Fluid bolus 500 mL in the ED and will give a maintenance IV fluids at 75 mL/h -Covid testing was negative -Blood cultures obtained in the ED and will need to follow-up on them -Suspect component of Aspiration given Vomiting after Surgery -We will add flutter valve, incentive spirometry as well as guaifenesin 1200 mg p.o. twice daily -Empiric antibiotic treatment with IV ceftriaxone and azithromycin; she was given some IV Zosyn in the ED -We will provide DuoNebs as needed q4h -Check sputum culture -Continue to monitor respiratory status carefully and repeat chest x-ray in a.m.  Hypokalemia -Mild.Patient's potassium was 3.2 -It was replete with p.o. potassium chloride 20 mg in the ED -Repeat this morning pending -Continue to monitor and trend replete  as necessary-repeat CMP in a.m.  Normocytic Anemia -Patient hemoglobin/hematocrit went from 12.2/36.0 is now 11.1/31.1 -Possible dilutional drop in hemoconcentration from her nausea vomiting -Check anemia panel in the a.m. -Continue to monitor for signs and symptoms  of bleeding; currently no overt bleeding noted -Repeat CBC in a.m.  History of Crohn's disease and breast cancer -Both are currently in remission  Recent abdominoplasty liposuction by Dr. Marla Roe -Has a girdle in place currently -Continue to monitor and if necessary will consult Dr. Marla Roe  We will continue to monitor the patient's clinical response to intervention and repeat blood work and imaging in the a.m.; she will need an ambulatory home O2 screen prior to discharge and hopefully can be discharged in the next 24 to 48 hours

## 2021-01-01 NOTE — ED Notes (Signed)
Transport here to take pt to [facility] via Bed

## 2021-01-01 NOTE — H&P (Signed)
History and Physical    Purvi Ruehl TIR:[id number] DOB: 1966/02/25 DOA: 12/31/2020  PCP: Nunzio Cobbs, MD  Patient coming from: Home.  Chief Complaint: Fever and cough.  HPI: Janice [name] is a 55 y.o. female with history of breast cancer in remission, Crohn's disease and remission has had abdominoplasty/liposuction on December 27, 2020 by Dr. Marla Roe plastic surgeon.  Patient states following the surgery patient had couple of episodes of vomiting and has been a persistent cough.  Over the last 24 hours patient started having fever chills and decided to go to the urgent care center where patient had a fever of 102 F COVID test over there was negative per the patient.  Patient was getting hypoxic on exertion and was referred to the ER.  ED Course: In the ER CT angiogram of chest showed bilateral pneumonia and also was observed to be hypoxic on exertion admitted for further observation was started on empiric antibiotics after blood cultures started.  Labs also show mild hypokalemia.  Covid test is pending.  EKG shows sinus tachycardia.  Review of Systems: As per HPI, rest all negative.   Past Medical History:  Diagnosis Date  . Atypical squamous cell changes of undetermined significance (ASCUS) on cervical cytology with negative high risk human papilloma virus (HPV) test result 2018  . Breast cancer ([place])    right  . Cancer ([facility]) 02/22/2006   rt breast cancer  . Crohn's disease [facility]) dx 2004  Crohn's ileitis   04-14-2003 resection terminal ileum, cecum, and appendix  . History of breast cancer oncologist-  dr Jana Hakim---  no recurrence (lov 11-29-2016)   dx 04/ 2007 --  Right breast carcinoma , invasive DCIS, Stage IIB, Grade 2 (T1c N1) ER/PR positive, HER-2 negative--  03-12-2006  s/p  right lumpectomy w/ sln bx's and dissection --  adjuvant chemo and radiation therapy completed 01/ 2008-- completed tamoxifen 01/ 2018  . History of cancer chemotherapy completed  01/ 2008   right breast  . History of genital warts    as teenager--  per pt frozen and did not return  . Hypertriglyceridemia   . Osteopenia 2018  . Personal history of chemotherapy   . Personal history of radiation therapy completed 01/ 2008   right breast  . PMB (postmenopausal bleeding) 06/07/2017  . Shingles     Past Surgical History:  Procedure Laterality Date  . BREAST LUMPECTOMY Right   . COLONOSCOPY    . D & C HYSTEROSCOPIC RESECTION POLYP  11-29-2009   dr Joan Flores  . HYSTEROSCOPY WITH D & C [name] 06/14/2017   Procedure: DILATATION AND CURETTAGE /HYSTEROSCOPY;  Surgeon: Nunzio Cobbs, MD;  Location: [facility];  Service: Gynecology;  Laterality: [name];  . LAPAROSCOPIC CHOLECYSTECTOMY  09/16/2002  . PORT-A-CATH REMOVAL  01/03/2007  . RESECTION TERMINAL ILEUM, CECUM, AND APPENDIX  04-14-2003   dr Excell Seltzer   Crohn's ileitis  . RIGHT BREAST LUMPECTOMY W/ MULTIPLE AXILLARY SENTINAL NODE BX'S AND DISSECTION  03/12/2006  . TRANSTHORACIC ECHOCARDIOGRAM  04/05/2006   ef 55-60%/  trivial MR and TR     reports that she has never smoked. She has never used smokeless tobacco. She reports that she does not drink alcohol and does not use drugs.  No Known Allergies  Family History  Problem Relation Age of Onset  . Heart attack Father   . Pneumonia Father        covid 48  . Dementia Father   .  Dementia Mother   . Hypertension Maternal Grandmother   . Breast cancer Neg Hx     Prior to Admission medications   Medication Sig Start Date End Date Taking? Authorizing Provider  BIOTIN PO Take 1 tablet by mouth daily.    [provider]  Cholecalciferol (VITAMIN D-3) 125 MCG (5000 UT) TABS Take 1 tablet by mouth See admin instructions. Monday through Friday    [provider]  hyoscyamine (LEVSIN) 0.125 MG tablet Take 0.125 mg by mouth every 4 (four) hours as needed.    [provider]  Multiple Vitamin (MULTIVITAMIN) tablet Take 1  tablet by mouth daily.    [provider]  ondansetron (ZOFRAN-ODT) 4 MG disintegrating tablet Take 1 tablet (4 mg total) by mouth every 8 (eight) hours as needed for nausea or vomiting. 12/14/20   Young, Venetia Night C, PA-C  Probiotic Product (ALIGN) 4 MG CAPS Take 1 capsule by mouth daily.    [provider]  vitamin B-12 (CYANOCOBALAMIN) 100 MCG tablet Take 100 mcg by mouth daily.    [provider]    Physical Exam: Constitutional: Moderately built and nourished. Vitals:   01/01/21 0300 01/01/21 0330 01/01/21 0345 01/01/21 0413  BP: 136/85 128/72 138/84   Pulse: (!) 103 96 98   Resp: (!) 22 18 (!) 22   Temp:    99.4 F (37.4 C)  TempSrc:    Oral  SpO2: 95% 94% 95%    Eyes: Anicteric no pallor. ENMT: No discharge from the ears eyes nose or mouth. Neck: No mass felt.  No neck rigidity. Respiratory: No rhonchi or crepitations. Cardiovascular: S1-S2 heard. Abdomen: Soft nontender bowel sounds present. Musculoskeletal: No edema. Skin: No rash. Neurologic: Alert awake oriented to time place and person.  Moves all extremities. Psychiatric: Appears normal.  Normal affect.   Labs on Admission: I have personally reviewed following labs and imaging studies  CBC: Recent Labs  Lab 12/31/20 2154  WBC 9.8  NEUTROABS 8.6*  HGB 12.2  HCT 36.0  MCV 93.8  PLT 035   Basic Metabolic Panel: Recent Labs  Lab 12/31/20 2154  NA 136  K 3.2*  CL 99  CO2 26  GLUCOSE 127*  BUN 6  CREATININE 0.69  CALCIUM 9.5   GFR: CrCl cannot be calculated (Unknown ideal weight.). Liver Function Tests: Recent Labs  Lab 12/31/20 2154  AST 21  ALT 17  ALKPHOS 62  BILITOT 2.0*  PROT 6.7  ALBUMIN 3.9   No results for input(s): LIPASE, AMYLASE in the last 168 hours. No results for input(s): AMMONIA in the last 168 hours. Coagulation Profile: Recent Labs  Lab 12/31/20 2154  INR 1.0   Cardiac Enzymes: No results for input(s): CKTOTAL, CKMB, CKMBINDEX, TROPONINI in  the last 168 hours. BNP (last 3 results) No results for input(s): PROBNP in the last 8760 hours. HbA1C: No results for input(s): HGBA1C in the last 72 hours. CBG: No results for input(s): GLUCAP in the last 168 hours. Lipid Profile: No results for input(s): CHOL, HDL, LDLCALC, TRIG, CHOLHDL, LDLDIRECT in the last 72 hours. Thyroid Function Tests: No results for input(s): TSH, T4TOTAL, FREET4, T3FREE, THYROIDAB in the last 72 hours. Anemia Panel: No results for input(s): VITAMINB12, FOLATE, FERRITIN, TIBC, IRON, RETICCTPCT in the last 72 hours. Urine analysis:    Component Value Date/Time   COLORURINE YELLOW 12/31/2020 2140   APPEARANCEUR HAZY (A) 12/31/2020 2140   LABSPEC 1.021 12/31/2020 2140   LABSPEC 1.010 03/04/2014 1257   PHURINE 5.0  12/31/2020 2140   GLUCOSEU NEGATIVE 12/31/2020 2140   GLUCOSEU Negative 03/04/2014 1257   HGBUR SMALL (A) 12/31/2020 2140   BILIRUBINUR NEGATIVE 12/31/2020 2140   BILIRUBINUR n 06/28/2017 1453   BILIRUBINUR Negative 03/04/2014 1257   KETONESUR 20 (A) 12/31/2020 2140   PROTEINUR NEGATIVE 12/31/2020 2140   UROBILINOGEN 0.2 06/28/2017 1453   UROBILINOGEN 0.2 03/04/2014 1257   NITRITE NEGATIVE 12/31/2020 2140   LEUKOCYTESUR NEGATIVE 12/31/2020 2140   LEUKOCYTESUR Trace 03/04/2014 1257   Sepsis Labs: @LABRCNTIP (procalcitonin:4,lacticidven:4) )No results found for this or any previous visit (from the past 240 hour(s)).   Radiological Exams on Admission: DG Chest 2 View  Result Date: 12/31/2020 CLINICAL DATA:  Vomiting and fever. EXAM: CHEST - 2 VIEW COMPARISON:  December 27, 2020 FINDINGS: The heart size and mediastinal contours are within normal limits. Both lungs are clear. Surgical clips are seen along the lateral aspect of the right hemithorax and within the right upper quadrant. The visualized skeletal structures are unremarkable. IMPRESSION: No active cardiopulmonary disease. Electronically Signed   By: Virgina Norfolk M.D.   On:  12/31/2020 22:39   CT Angio Chest PE W/Cm &/Or Wo Cm  Result Date: 01/01/2021 CLINICAL DATA:  Cough, fever and tachycardia. EXAM: CT ANGIOGRAPHY CHEST WITH CONTRAST TECHNIQUE: Multidetector CT imaging of the chest was performed using the standard protocol during bolus administration of intravenous contrast. Multiplanar CT image reconstructions and MIPs were obtained to evaluate the vascular anatomy. CONTRAST:  28m OMNIPAQUE IOHEXOL 350 MG/ML SOLN COMPARISON:  Chest radiography same day FINDINGS: Cardiovascular: Heart size upper limits of normal. No coronary artery calcification is visible. No aortic atherosclerotic calcification is visible. Pulmonary arterial opacification is moderate. No visible pulmonary emboli. There is apparent stenosis of the innominate vein, with multiple collateral vessels in the superior mediastinum and neck. Mediastinum/Nodes: No mediastinal or hilar mass or lymphadenopathy. Lungs/Pleura: Bilateral lower lobe pneumonia, right more than left. Tiny amount of pleural fluid on the right. Upper Abdomen: Normal.  Previous cholecystectomy. Musculoskeletal: No significant bone finding. Review of the MIP images confirms the above findings. IMPRESSION: 1. No pulmonary emboli. 2. Bilateral lower lobe pneumonia, right more than left. Tiny amount of pleural fluid on the right. 3. Apparent stenosis of the innominate vein, with multiple collateral vessels in the superior mediastinum and neck. Electronically Signed   By: MNelson ChimesM.D.   On: 01/01/2021 01:54    EKG: Independently reviewed.  Sinus tachycardia.  Assessment/Plan Principal Problem:   Pneumonia Active Problems:   [place]   CAP (community acquired pneumonia)    1. Pneumonia -suspect likely could be from aspiration event.  Presently on empiric antibiotics follow cultures and closely monitor.  Covid test is pending. 2. Mild hypokalemia could be from nausea vomiting.  Replace recheck. 3. History of  Crohn's disease and breast cancer in remission. 4. Recent abdominoplasty liposuction by Dr. DMarla Roe  Covid test is pending.   DVT prophylaxis: Lovenox. Code Status: Full code. Family Communication: Discussed with patient. Disposition Plan: Home. Consults called: None. Admission status: Observation.   ARise PatienceMD [facility] Pager [phone]  If 7PM-7AM, please contact night-coverage [email or web address] Password [facility] 01/01/2021, 4:45 AM

## 2021-01-02 ENCOUNTER — Observation Stay (HOSPITAL_COMMUNITY): Payer: BC Managed Care – PPO

## 2021-01-02 DIAGNOSIS — Z20822 Contact with and (suspected) exposure to covid-19: Secondary | ICD-10-CM | POA: Diagnosis present

## 2021-01-02 DIAGNOSIS — C50911 Malignant neoplasm of unspecified site of right female breast: Secondary | ICD-10-CM | POA: Diagnosis not present

## 2021-01-02 DIAGNOSIS — A419 Sepsis, unspecified organism: Secondary | ICD-10-CM | POA: Diagnosis present

## 2021-01-02 DIAGNOSIS — D649 Anemia, unspecified: Secondary | ICD-10-CM | POA: Diagnosis present

## 2021-01-02 DIAGNOSIS — J9589 Other postprocedural complications and disorders of respiratory system, not elsewhere classified: Secondary | ICD-10-CM | POA: Diagnosis present

## 2021-01-02 DIAGNOSIS — Z923 Personal history of irradiation: Secondary | ICD-10-CM | POA: Diagnosis not present

## 2021-01-02 DIAGNOSIS — K5000 Crohn's disease of small intestine without complications: Secondary | ICD-10-CM | POA: Diagnosis present

## 2021-01-02 DIAGNOSIS — Z9221 Personal history of antineoplastic chemotherapy: Secondary | ICD-10-CM | POA: Diagnosis not present

## 2021-01-02 DIAGNOSIS — R17 Unspecified jaundice: Secondary | ICD-10-CM | POA: Diagnosis present

## 2021-01-02 DIAGNOSIS — R0602 Shortness of breath: Secondary | ICD-10-CM | POA: Diagnosis not present

## 2021-01-02 DIAGNOSIS — Z170 Estrogen receptor positive status [ER+]: Secondary | ICD-10-CM | POA: Diagnosis not present

## 2021-01-02 DIAGNOSIS — Z79899 Other long term (current) drug therapy: Secondary | ICD-10-CM | POA: Diagnosis not present

## 2021-01-02 DIAGNOSIS — E781 Pure hyperglyceridemia: Secondary | ICD-10-CM | POA: Diagnosis present

## 2021-01-02 DIAGNOSIS — Y838 Other surgical procedures as the cause of abnormal reaction of the patient, or of later complication, without mention of misadventure at the time of the procedure: Secondary | ICD-10-CM | POA: Diagnosis present

## 2021-01-02 DIAGNOSIS — Z853 Personal history of malignant neoplasm of breast: Secondary | ICD-10-CM | POA: Diagnosis not present

## 2021-01-02 DIAGNOSIS — E876 Hypokalemia: Secondary | ICD-10-CM | POA: Diagnosis present

## 2021-01-02 DIAGNOSIS — Z9049 Acquired absence of other specified parts of digestive tract: Secondary | ICD-10-CM | POA: Diagnosis not present

## 2021-01-02 DIAGNOSIS — J9601 Acute respiratory failure with hypoxia: Secondary | ICD-10-CM | POA: Diagnosis present

## 2021-01-02 DIAGNOSIS — J189 Pneumonia, unspecified organism: Secondary | ICD-10-CM | POA: Diagnosis present

## 2021-01-02 DIAGNOSIS — I871 Compression of vein: Secondary | ICD-10-CM | POA: Diagnosis present

## 2021-01-02 DIAGNOSIS — J690 Pneumonitis due to inhalation of food and vomit: Secondary | ICD-10-CM | POA: Diagnosis present

## 2021-01-02 LAB — CBC WITH DIFFERENTIAL/PLATELET
Abs Immature Granulocytes: 0.04 10*3/uL (ref 0.00–0.07)
Basophils Absolute: 0 10*3/uL (ref 0.0–0.1)
Basophils Relative: 0 %
Eosinophils Absolute: 0.2 10*3/uL (ref 0.0–0.5)
Eosinophils Relative: 2 %
HCT: 33.1 % — ABNORMAL LOW (ref 36.0–46.0)
Hemoglobin: 11.7 g/dL — ABNORMAL LOW (ref 12.0–15.0)
Immature Granulocytes: 0 %
Lymphocytes Relative: 13 %
Lymphs Abs: 1.1 10*3/uL (ref 0.7–4.0)
MCH: 32.8 pg (ref 26.0–34.0)
MCHC: 35.3 g/dL (ref 30.0–36.0)
MCV: 92.7 fL (ref 80.0–100.0)
Monocytes Absolute: 0.7 10*3/uL (ref 0.1–1.0)
Monocytes Relative: 8 %
Neutro Abs: 7 10*3/uL (ref 1.7–7.7)
Neutrophils Relative %: 77 %
Platelets: 235 10*3/uL (ref 150–400)
RBC: 3.57 MIL/uL — ABNORMAL LOW (ref 3.87–5.11)
RDW: 12.4 % (ref 11.5–15.5)
WBC: 9.1 10*3/uL (ref 4.0–10.5)
nRBC: 0 % (ref 0.0–0.2)

## 2021-01-02 LAB — VITAMIN B12: Vitamin B-12: 467 pg/mL (ref 180–914)

## 2021-01-02 LAB — COMPREHENSIVE METABOLIC PANEL
ALT: 14 U/L (ref 0–44)
AST: 16 U/L (ref 15–41)
Albumin: 3.4 g/dL — ABNORMAL LOW (ref 3.5–5.0)
Alkaline Phosphatase: 62 U/L (ref 38–126)
Anion gap: 10 (ref 5–15)
BUN: <5 mg/dL — ABNORMAL LOW (ref 6–20)
CO2: 25 mmol/L (ref 22–32)
Calcium: 9 mg/dL (ref 8.9–10.3)
Chloride: 103 mmol/L (ref 98–111)
Creatinine, Ser: 0.63 mg/dL (ref 0.44–1.00)
GFR, Estimated: >60 mL/min (ref >60)
Glucose, Bld: 119 mg/dL — ABNORMAL HIGH (ref 70–99)
Potassium: 3.2 mmol/L — ABNORMAL LOW (ref 3.5–5.1)
Sodium: 138 mmol/L (ref 135–145)
Total Bilirubin: 1.5 mg/dL — ABNORMAL HIGH (ref 0.3–1.2)
Total Protein: 6.3 g/dL — ABNORMAL LOW (ref 6.5–8.1)

## 2021-01-02 LAB — RETICULOCYTES
Immature Retic Fract: 20.8 % — ABNORMAL HIGH (ref 2.3–15.9)
RBC.: 3.64 MIL/uL — ABNORMAL LOW (ref 3.87–5.11)
Retic Count, Absolute: 73.5 10*3/uL (ref 19.0–186.0)
Retic Ct Pct: 2 % (ref 0.4–3.1)

## 2021-01-02 LAB — FERRITIN: Ferritin: 131 ng/mL (ref 11–307)

## 2021-01-02 LAB — MAGNESIUM: Magnesium: 1.7 mg/dL (ref 1.7–2.4)

## 2021-01-02 LAB — FOLATE: Folate: 23.3 ng/mL (ref >5.9)

## 2021-01-02 LAB — IRON AND TIBC
Iron: 17 ug/dL — ABNORMAL LOW (ref 28–170)
Saturation Ratios: 6 % — ABNORMAL LOW (ref 10.4–31.8)
TIBC: 298 ug/dL (ref 250–450)
UIBC: 281 ug/dL

## 2021-01-02 LAB — PHOSPHORUS: Phosphorus: 3.3 mg/dL (ref 2.5–4.6)

## 2021-01-02 MED ORDER — AZITHROMYCIN 250 MG PO TABS
500.0000 mg | ORAL_TABLET | Freq: Every day | ORAL | Status: DC
  Administered 2021-01-03: 500 mg via ORAL
  Filled 2021-01-02: qty 2

## 2021-01-02 MED ORDER — POLYSACCHARIDE IRON COMPLEX 150 MG PO CAPS
150.0000 mg | ORAL_CAPSULE | Freq: Every day | ORAL | Status: DC
  Administered 2021-01-02 – 2021-01-03 (×2): 150 mg via ORAL
  Filled 2021-01-02 (×2): qty 1

## 2021-01-02 MED ORDER — MAGNESIUM SULFATE 2 GM/50ML IV SOLN
2.0000 g | Freq: Once | INTRAVENOUS | Status: AC
  Administered 2021-01-02: 2 g via INTRAVENOUS
  Filled 2021-01-02: qty 50

## 2021-01-02 MED ORDER — POTASSIUM CHLORIDE CRYS ER 20 MEQ PO TBCR
40.0000 meq | EXTENDED_RELEASE_TABLET | Freq: Two times a day (BID) | ORAL | Status: AC
  Administered 2021-01-02 (×2): 40 meq via ORAL
  Filled 2021-01-02 (×2): qty 2

## 2021-01-02 NOTE — Progress Notes (Signed)
PROGRESS NOTE    Janice [name] [facility]  ASN:[id number] DOB: Jan 14, 1966 DOA: 12/31/2020 PCP: Nunzio Cobbs, MD   Brief Narrative:  The patient is a 55 year old Caucasian female with a past history of breast cancers and is currently in remission, Crohn's disease which is also in remission as well as other comorbidities who recently had an abdominoplasty/liposuction on December 27, 2020 by Dr. Elisabeth Cara plastic surgeon.  Following her surgery patient had episodes of vomiting and has been having a persistent cough.  Patient had vomiting after anesthesia and over the last 24 hours she started developing fevers and chills and decided to directly care where she was found to have a temperature of 102.  Covid testing was negative at that time.  Started getting hypoxic on exertion and then was referred to the ER.  In the ED she underwent a CT angiogram of the chest which showed bilateral pneumonia and also was observed to be hypoxic on exertion and was admitted acute hypoxic respiratory failure as well as in the setting of possible aspiration pneumonia.  Labs also showed mild hypokalemia and repeat Covid testing was negative.  EKG shows sinus tachycardia and upon my evaluation today she is mildly tachycardic with a rate of 102.  On ambulation today she desaturated down to 78% recovered fairly quickly. We will continue antibiotics and provide the patient with a flutter valve, incentive spirometry. Will observe for another day and assess for home oxygen needs again in the morning. CT scan of the chest showed no PE. As she continues to desaturate we will check an echocardiogram to evaluate for heart failure.  Assessment & Plan:   Principal Problem:   Pneumonia Active Problems:   Breast cancer, right breast ([place])   CAP (community acquired pneumonia)  Acute hypoxic respiratory failure in the setting of pneumonia likely from aspiration -Placed in observation telemetry but suspect she will be changed to  inpatient -SpO2: (!) 78 % (after ambulating to bathroom and back to bed) -Again Hypoxic on ambulation and will need an ambulatory home O2 screen prior to discharge -Continuous pulse oximetry maintain O2 saturations greater 90% -Continue supplemental oxygen via nasal cannula as needed and wean down to room air as tolerated -Repeat chest x-ray this AM showed "Slight increased opacity in the bases consistent with atelectasis or infiltrate. Opacity less prominent in bases compared to CT from 1 day prior. No new opacity evident. Lungs elsewhere clear. Cardiac silhouette normal." -Repeat CXR and Ambulatory Home O2 Screen prior to D/C  Sepsis secondary to bilateral CAP vs Aspiration, present on admission -She met sepsis criteria on admission given that she is febrile in the urgent care, tachypnea with a rate of 22, as well as a tachycardia with a rate of 125 as well as a source of infection of pneumonia -Sepsis physiology is improved -Lactic acid level was not elevated  -CXR showed "The heart size and mediastinal contours are within normal limits. Both lungs are clear. Surgical clips are seen along the lateral aspect of the right hemithorax and within the right upper quadrant. The visualized skeletal structures are unremarkable."  -CTA showed "No pulmonary emboli. Bilateral lower lobe pneumonia, right more than left. Tiny amount of pleural fluid on the right. Apparent stenosis of the innominate vein, with multiple collateral vessels in the superior mediastinum and neck.." -Fluid bolus 500 mL in the ED and will give a maintenance IV fluids at 75 mL/h; Now IVF to stop -Covid testing was negative -Blood cultures obtained in the  ED and will need to follow-up on them -Suspect component of Aspiration given Vomiting after Surgery -We will add flutter valve, incentive spirometry as well as guaifenesin 1200 mg p.o. twice daily; Was not given the flutter valve and incentive yesterday  -Empiric antibiotic  treatment with IV ceftriaxone and azithromycin; she was given some IV Zosyn in the ED -We will provide DuoNebs as needed q4h -Check sputum culture -Continue to monitor respiratory status carefully and repeat chest x-ray in a.m.  Hypokalemia -Mild.Patient's potassium was 3.2 again  -Replete with po KCL 40 mEQ BID x2 -Mag Level was 1.7; Replete Mag with IV Mag Sulfate 2 grams -Continue to monitor and trend replete as necessary -repeat CMP in a.m.  Hyperbilirubinemia -Mild and Reactive -T Bili went from 2.2 -> 1.5 -Continue to Monitor and Trend -Repeat CMP in the AM   Normocytic Anemia -Patient hemoglobin/hematocrit went from 12.2/36.0 -> 11.1/31.1 -> 11.7/33.1 -Possible dilutional drop in hemoconcentration from her nausea vomiting -Checked Anemia Panel and showed an iron level of 17, U IBC of 281, TIBC of 298, saturation ratio of 6%, ferritin level 131, folate level 23.3, vitamin B12 467 -Will start patient on Iron Polysaccharides  -Continue to monitor for signs and symptoms of bleeding; currently no overt bleeding noted -Repeat CBC in a.m.  History of Crohn's disease and breast cancer -Both are currently in remission  Recent abdominoplasty liposuction by Dr. Marla Roe -Has a girdle in place currently -Continue to monitor and if necessary will consult Dr. Marla Roe   DVT prophylaxis: Enoxaparin 40 mg sq q24h Code Status: FULL CODE  Family Communication: No family present at bedside  Disposition Plan: Pending further clinical improvement and ensure stability as she desaturated significantly to 78% this AM   Status is: Observation  The patient will require care spanning > 2 midnights and should be moved to inpatient because: Unsafe d/c plan, IV treatments appropriate due to intensity of illness or inability to take PO and Inpatient level of care appropriate due to severity of illness  Dispo: The patient is from: Home              Anticipated d/c is to: Home               Anticipated d/c date is: 2 days              Patient currently is not medically stable to d/c.   Difficult to place patient No  Consultants:   None   Procedures: None  Antimicrobials:  Anti-infectives (From admission, onward)   Start     Dose/Rate Route Frequency Ordered Stop   01/01/21 1000  cefTRIAXone (ROCEPHIN) 2 g in sodium chloride 0.9 % 100 mL IVPB        2 g 200 mL/hr over 30 Minutes Intravenous Daily 01/01/21 0444 01/06/21 0959   01/01/21 0600  azithromycin (ZITHROMAX) 500 mg in sodium chloride 0.9 % 250 mL IVPB        500 mg 250 mL/hr over 60 Minutes Intravenous Daily 01/01/21 0444 01/06/21 0959   01/01/21 0345  piperacillin-tazobactam (ZOSYN) IVPB 3.375 g        3.375 g 100 mL/hr over 30 Minutes Intravenous  Once 01/01/21 0336 01/01/21 0455        Subjective: Seen and examined at bedside and states that she is feeling well however she did desaturate significantly down to 78% on ambulation from the bathroom back to her bed.  No chest pain, lightheadedness or dizziness.  States that she is  still coughing a little bit.  No other concerns or close at this time.  Objective: Vitals:   01/01/21 0900 01/01/21 1215 01/01/21 1600 01/01/21 1733  BP: 109/65 119/71 124/68 135/79  Pulse: 80 80 91 (!) 106  Resp: _0 Temp:   99.1 F (37.3 C) 99.7 F (37.6 C)  TempSrc:   Oral Oral  SpO2: 94% 95% 95% 95%    Intake/Output Summary (Last 24 hours) at 01/02/2021 1001 Last data filed at 01/02/2021 9311 Gross per 24 hour  Intake 2060 ml  Output -  Net 2060 ml   There were no vitals filed for this visit.  Examination: Physical Exam:  Constitutional: WN/WD Caucasian female in NAD and appears calm and comfortable Eyes: Lids and conjunctivae normal, sclerae anicteric  ENMT: External Ears, Nose appear normal. Grossly normal hearing.  Neck: Appears normal, supple, no cervical masses, normal ROM, no appreciable thyromegaly Respiratory: Diminished to auscultation  bilaterally specifically at the bases, no wheezing, rales, rhonchi or crackles. Normal respiratory effort and patient is not tachypenic. No accessory muscle use.  Unlabored breathing and not wearing supplemental oxygen via nasal cannula currently Cardiovascular: RRR, no murmurs / rubs / gallops. S1 and S2 auscultated.  No appreciable extremity edema Abdomen: Soft, non-tender, non-distended.  Bowel sounds positive.  GU: Deferred. Musculoskeletal: No clubbing / cyanosis of digits/nails. No joint deformity upper and lower extremities. Skin: No rashes, lesions, ulcers on limited skin evaluation. No induration; Warm and dry.  Neurologic: CN 2-12 grossly intact with no focal deficits. Romberg sign and cerebellar reflexes not assessed.  Psychiatric: Normal judgment and insight. Alert and oriented x 3. Normal mood and appropriate affect.   Data Reviewed: I have personally reviewed following labs and imaging studies  CBC: Recent Labs  Lab 12/31/20 2154 01/01/21 0551 01/02/21 0231  WBC 9.8 9.1 9.1  NEUTROABS 8.6*  --  7.0  HGB 12.2 11.1* 11.7*  HCT 36.0 31.1* 33.1*  MCV 93.8 94.0 92.7  PLT 233 202 216   Basic Metabolic Panel: Recent Labs  Lab 12/31/20 2154 01/01/21 0551 01/01/21 1025 01/02/21 0231  NA 136  --  140 138  K 3.2*  --  3.5 3.2*  CL 99  --  103 103  CO2 26  --  28 25  GLUCOSE 127*  --  102* 119*  BUN 6  --  5* <5*  CREATININE 0.69 0.60 0.64 0.63  CALCIUM 9.5  --  8.9 9.0  MG  --   --  2.1 1.7  PHOS  --   --  3.6 3.3   GFR: CrCl cannot be calculated (Unknown ideal weight.). Liver Function Tests: Recent Labs  Lab 12/31/20 2154 01/01/21 1025 01/02/21 0231  AST _1 ALT _2 ALKPHOS 62 57 62  BILITOT 2.0* 2.2* 1.5*  PROT 6.7 5.9* 6.3*  ALBUMIN 3.9 3.2* 3.4*   No results for input(s): LIPASE, AMYLASE in the last 168 hours. No results for input(s): AMMONIA in the last 168 hours. Coagulation Profile: Recent Labs  Lab 12/31/20 2154  INR 1.0    Cardiac Enzymes: No results for input(s): CKTOTAL, CKMB, CKMBINDEX, TROPONINI in the last 168 hours. BNP (last 3 results) No results for input(s): PROBNP in the last 8760 hours. HbA1C: No results for input(s): HGBA1C in the last 72 hours. CBG: No results for input(s): GLUCAP in the last 168 hours. Lipid Profile: No results for input(s): CHOL, HDL, LDLCALC, TRIG, CHOLHDL, LDLDIRECT in the last 72  hours. Thyroid Function Tests: No results for input(s): TSH, T4TOTAL, FREET4, T3FREE, THYROIDAB in the last 72 hours. Anemia Panel: Recent Labs    01/02/21 0231  VITAMINB12 467  FOLATE 23.3  FERRITIN 131  TIBC 298  IRON 17*  RETICCTPCT 2.0   Sepsis Labs: Recent Labs  Lab 12/31/20 2154 01/01/21 0000  LATICACIDVEN 0.9 1.0    Recent Results (from the past 240 hour(s))  Culture, blood (Routine x 2)     Status: None (Preliminary result)   Collection Time: 12/31/20  9:56 PM   Specimen: BLOOD RIGHT ARM  Result Value Ref Range Status   Specimen Description BLOOD RIGHT ARM  Final   Special Requests   Final    BOTTLES DRAWN AEROBIC ONLY Blood Culture adequate volume Performed at [facility], [place] [address], [place], [place] [address]    Culture PENDING  Incomplete   Report Status PENDING  Incomplete  Resp Panel by RT-PCR (Flu A&B, Covid) Nasopharyngeal Swab     Status: None   Collection Time: 01/01/21  5:07 AM   Specimen: Nasopharyngeal Swab; Nasopharyngeal(NP) swabs in vial transport medium  Result Value Ref Range Status   SARS Coronavirus 2 by RT PCR NEGATIVE NEGATIVE Final    Comment: (NOTE) SARS-CoV-2 target nucleic acids are NOT DETECTED.  The SARS-CoV-2 RNA is generally detectable in upper respiratory specimens during the acute phase of infection. The lowest concentration of SARS-CoV-2 viral copies this assay can detect is 138 copies/mL. A negative result does not preclude SARS-Cov-2 infection and should not be used as the sole basis for treatment or other  patient management decisions. A negative result may occur with  improper specimen collection/handling, submission of specimen other than nasopharyngeal swab, presence of viral mutation(s) within the areas targeted by this assay, and inadequate number of viral copies(<138 copies/mL). A negative result must be combined with clinical observations, patient history, and epidemiological information. The expected result is Negative.  Fact Sheet for Patients:  EntrepreneurPulse.com.au  Fact Sheet for Healthcare Providers:  IncredibleEmployment.be  This test is no t yet approved or cleared by the [place] FDA and  has been authorized for detection and/or diagnosis of SARS-CoV-2 by FDA under an Emergency Use Authorization (EUA). This EUA will remain  in effect (meaning this test can be used) for the duration of the COVID-19 declaration under Section 564(b)(1) of the Act, 21 U.S.C.section 360bbb-3(b)(1), unless the authorization is terminated  or revoked sooner.       Influenza A by PCR NEGATIVE NEGATIVE Final   Influenza B by PCR NEGATIVE NEGATIVE Final    Comment: (NOTE) The Xpert Xpress SARS-CoV-2/FLU/RSV plus assay is intended as an aid in the diagnosis of influenza from Nasopharyngeal swab specimens and should not be used as a sole basis for treatment. Nasal washings and aspirates are unacceptable for Xpert Xpress SARS-CoV-2/FLU/RSV testing.  Fact Sheet for Patients: EntrepreneurPulse.com.au  Fact Sheet for Healthcare Providers: IncredibleEmployment.be  This test is not yet approved or cleared by the [place] FDA and has been authorized for detection and/or diagnosis of SARS-CoV-2 by FDA under an Emergency Use Authorization (EUA). This EUA will remain in effect (meaning this test can be used) for the duration of the COVID-19 declaration under Section 564(b)(1) of the Act, 21 U.S.C. section  360bbb-3(b)(1), unless the authorization is terminated or revoked.  Performed at [facility], [place] [address] [place], [place] [address]      RN Pressure Injury Documentation:     Estimated body  mass index is 25.37 kg/m as calculated from the following:   Height as of 12/14/20: _0  (1.702 m).   Weight as of 12/14/20: 73.5 kg.  Malnutrition Type:   Malnutrition Characteristics:   Nutrition Interventions:    Radiology Studies: DG Chest 2 View  Result Date: 12/31/2020 CLINICAL DATA:  Vomiting and fever. EXAM: CHEST - 2 VIEW COMPARISON:  December 27, 2020 FINDINGS: The heart size and mediastinal contours are within normal limits. Both lungs are clear. Surgical clips are seen along the lateral aspect of the right hemithorax and within the right upper quadrant. The visualized skeletal structures are unremarkable. IMPRESSION: No active cardiopulmonary disease. Electronically Signed   By: Virgina Norfolk M.D.   On: 12/31/2020 22:39   CT Angio Chest PE W/Cm &/Or Wo Cm  Result Date: 01/01/2021 CLINICAL DATA:  Cough, fever and tachycardia. EXAM: CT ANGIOGRAPHY CHEST WITH CONTRAST TECHNIQUE: Multidetector CT imaging of the chest was performed using the standard protocol during bolus administration of intravenous contrast. Multiplanar CT image reconstructions and MIPs were obtained to evaluate the vascular anatomy. CONTRAST:  18m OMNIPAQUE IOHEXOL 350 MG/ML SOLN COMPARISON:  Chest radiography same day FINDINGS: Cardiovascular: Heart size upper limits of normal. No coronary artery calcification is visible. No aortic atherosclerotic calcification is visible. Pulmonary arterial opacification is moderate. No visible pulmonary emboli. There is apparent stenosis of the innominate vein, with multiple collateral vessels in the superior mediastinum and neck. Mediastinum/Nodes: No mediastinal or hilar mass or lymphadenopathy. Lungs/Pleura: Bilateral lower lobe pneumonia, right more than left. Tiny  amount of pleural fluid on the right. Upper Abdomen: Normal.  Previous cholecystectomy. Musculoskeletal: No significant bone finding. Review of the MIP images confirms the above findings. IMPRESSION: 1. No pulmonary emboli. 2. Bilateral lower lobe pneumonia, right more than left. Tiny amount of pleural fluid on the right. 3. Apparent stenosis of the innominate vein, with multiple collateral vessels in the superior mediastinum and neck. Electronically Signed   By: MNelson ChimesM.D.   On: 01/01/2021 01:54   DG CHEST PORT 1 VIEW  Result Date: 01/02/2021 CLINICAL DATA:  Shortness of breath EXAM: PORTABLE CHEST 1 VIEW COMPARISON:  December 31, 2020 chest radiograph; chest CT January 01, 2021. FINDINGS: There is slight increased opacity in the lung bases which is less apparent by radiography than on chest CT from 1 day prior. Elsewhere lungs are clear. Heart size and pulmonary vascularity are normal. No adenopathy. There are surgical clips in the right axillary region. IMPRESSION: Slight increased opacity in the bases consistent with atelectasis or infiltrate. Opacity less prominent in bases compared to CT from 1 day prior. No new opacity evident. Lungs elsewhere clear. Cardiac silhouette normal. Electronically Signed   By: WLowella GripIII M.D.   On: 01/02/2021 09:04   Scheduled Meds: . acidophilus  1 capsule Oral Daily  . enoxaparin (LOVENOX) injection  40 mg Subcutaneous Daily  . guaiFENesin  1,200 mg Oral BID  . iron polysaccharides  150 mg Oral Daily  . potassium chloride  40 mEq Oral BID  . vitamin B-12  1,000 mcg Oral Daily   Continuous Infusions: . sodium chloride 75 mL/hr at 01/02/21 0807  . azithromycin Stopped (01/01/21 0758)  . cefTRIAXone (ROCEPHIN)  IV Stopped (01/01/21 1120)    LOS: 0 days   OKerney Elbe DO [facility] PAGER is on [place] If 7PM-7AM, please contact night-coverage [email or web address]

## 2021-01-03 ENCOUNTER — Inpatient Hospital Stay (HOSPITAL_COMMUNITY): Payer: BC Managed Care – PPO

## 2021-01-03 ENCOUNTER — Other Ambulatory Visit: Payer: Self-pay

## 2021-01-03 ENCOUNTER — Encounter (HOSPITAL_COMMUNITY): Payer: Self-pay | Admitting: Internal Medicine

## 2021-01-03 LAB — CBC WITH DIFFERENTIAL/PLATELET
Abs Immature Granulocytes: 0.02 10*3/uL (ref 0.00–0.07)
Basophils Absolute: 0 10*3/uL (ref 0.0–0.1)
Basophils Relative: 0 %
Eosinophils Absolute: 0.3 10*3/uL (ref 0.0–0.5)
Eosinophils Relative: 5 %
HCT: 30.5 % — ABNORMAL LOW (ref 36.0–46.0)
Hemoglobin: 10.7 g/dL — ABNORMAL LOW (ref 12.0–15.0)
Immature Granulocytes: 0 %
Lymphocytes Relative: 20 %
Lymphs Abs: 1.3 10*3/uL (ref 0.7–4.0)
MCH: 32.6 pg (ref 26.0–34.0)
MCHC: 35.1 g/dL (ref 30.0–36.0)
MCV: 93 fL (ref 80.0–100.0)
Monocytes Absolute: 0.6 10*3/uL (ref 0.1–1.0)
Monocytes Relative: 9 %
Neutro Abs: 4.6 10*3/uL (ref 1.7–7.7)
Neutrophils Relative %: 66 %
Platelets: 231 10*3/uL (ref 150–400)
RBC: 3.28 MIL/uL — ABNORMAL LOW (ref 3.87–5.11)
RDW: 12.4 % (ref 11.5–15.5)
WBC: 6.8 10*3/uL (ref 4.0–10.5)
nRBC: 0 % (ref 0.0–0.2)

## 2021-01-03 LAB — COMPREHENSIVE METABOLIC PANEL
ALT: 12 U/L (ref 0–44)
AST: 14 U/L — ABNORMAL LOW (ref 15–41)
Albumin: 3 g/dL — ABNORMAL LOW (ref 3.5–5.0)
Alkaline Phosphatase: 55 U/L (ref 38–126)
Anion gap: 8 (ref 5–15)
BUN: 5 mg/dL — ABNORMAL LOW (ref 6–20)
CO2: 26 mmol/L (ref 22–32)
Calcium: 9 mg/dL (ref 8.9–10.3)
Chloride: 106 mmol/L (ref 98–111)
Creatinine, Ser: 0.57 mg/dL (ref 0.44–1.00)
GFR, Estimated: >60 mL/min (ref >60)
Glucose, Bld: 98 mg/dL (ref 70–99)
Potassium: 4 mmol/L (ref 3.5–5.1)
Sodium: 140 mmol/L (ref 135–145)
Total Bilirubin: 1.3 mg/dL — ABNORMAL HIGH (ref 0.3–1.2)
Total Protein: 5.9 g/dL — ABNORMAL LOW (ref 6.5–8.1)

## 2021-01-03 LAB — PHOSPHORUS: Phosphorus: 3.4 mg/dL (ref 2.5–4.6)

## 2021-01-03 LAB — MAGNESIUM: Magnesium: 2.1 mg/dL (ref 1.7–2.4)

## 2021-01-03 MED ORDER — POLYSACCHARIDE IRON COMPLEX 150 MG PO CAPS: 150.0000 mg | ORAL_CAPSULE | Freq: Every day | ORAL | 0 refills | Status: DC

## 2021-01-03 MED ORDER — ACETAMINOPHEN 325 MG PO TABS: 650.0000 mg | ORAL_TABLET | Freq: Four times a day (QID) | ORAL | 0 refills | Status: DC | PRN

## 2021-01-03 MED ORDER — CEFDINIR 300 MG PO CAPS: 300.0000 mg | ORAL_CAPSULE | Freq: Two times a day (BID) | ORAL | 0 refills | Status: AC

## 2021-01-03 MED ORDER — AZITHROMYCIN 500 MG PO TABS: 500.0000 mg | ORAL_TABLET | Freq: Every day | ORAL | 0 refills | Status: AC

## 2021-01-03 MED ORDER — GUAIFENESIN ER 600 MG PO TB12: 600.0000 mg | ORAL_TABLET | Freq: Two times a day (BID) | ORAL | 0 refills | Status: DC

## 2021-01-03 NOTE — Discharge Summary (Signed)
Physician Discharge Summary  Ineze Serrao [facility] NWG:[id number] DOB: [birth year] DOA: 12/31/2020  PCP: Nunzio Cobbs, MD  Admit date: 12/31/2020 Discharge date: 01/03/2021  Admitted From: Home Disposition: Home  Recommendations for Outpatient Follow-up:  1. Follow up with PCP in 1-2 weeks 2. Repeat CXR 3-6 weeks 3. Follow up with Dr. Marla Roe within 1-2 weeks 4. Please obtain CMP/CBC, Mag, Phos in one week 5. Please follow up on the following pending results:  Home Health: No  Equipment/Devices: None    Discharge Condition: Stable CODE STATUS: FULL CODE Diet recommendation: Heart Healthy Diet   Brief/Interim Summary: The patient is a 55 year old Caucasian female with a past history of breast cancers and is currently in remission, Crohn's disease which is also in remission as well as other comorbidities who recently had an abdominoplasty/liposuction on December 27, 2020 by Dr. Elisabeth Cara plastic surgeon. Following her surgery patient had episodes of vomiting and has been having a persistent cough. Patient had vomiting after anesthesia and over the last 24 hours she started developing fevers and chills and decided to directly care where she was found to have a temperature of 102. Covid testing was negative at that time. Started getting hypoxic on exertion and then was referred to the ER. In the ED she underwent a CT angiogram of the chest which showed bilateral pneumonia and also was observed to be hypoxic on exertion and was admitted acute hypoxic respiratory failure as well as in the setting of possible aspiration pneumonia. Labs also showed mild hypokalemia and repeat Covid testing was negative. EKG shows sinus tachycardia and upon my evaluation today she is mildly tachycardic with a rate of 102.  On ambulation today she desaturated down to 78% recovered fairly quickly. We will continue antibiotics and provide the patient with a flutter valve, incentive spirometry. Will  observe for another day and assess for home oxygen needs again in the morning. CT scan of the chest showed no PE. If she continues to desaturate we will check an echocardiogram to evaluate for heart failure but she is stable and did not desaturate on Home O2 screen today. She is stable to D/C Home and follow up with PCP within 1-2 weeks. Abx changed to po today and she will need repeat CXR in 3-6 weeks.   Discharge Diagnoses:  Principal Problem:   Pneumonia Active Problems:   Breast cancer, right breast ([place])   CAP (community acquired pneumonia)  Acute hypoxic respiratory failure in the setting of pneumonia likely from aspiration -Placed in observation telemetry but suspect she will be changed to inpatient -SpO2: (!) 78 % (after ambulating to bathroom and back to bed) yesterday -SpO2: 90 % (while ambulating in the hall) today  -Not Hypoxic on ambulation during ambulatory home O2 screen prior to discharge today  -Continuous pulse oximetry maintain O2 saturations greater 90% -Continue supplemental oxygen via nasal cannula as needed and wean down to room air as tolerated -Repeat chest x-ray this AM showed "Slight increased opacity in the bases consistent with atelectasis or infiltrate. Opacity less prominent in bases compared to CT from 1 day prior. No new opacity evident. Lungs elsewhere clear. Cardiac silhouette normal." -Repeat CXR today showed "Vague bibasilar streaky airspace opacities could represent a combination of atelectasis and/or infection/pneumonia."  Sepsis secondary to bilateral Aspiration PNA, present on admission -She met sepsis criteria on admission given that she is febrile in the urgent care, tachypnea with a rate of 22, as well as a tachycardia with a rate of 125  as well as a source of infection of pneumonia -Sepsis physiology is improved -Lactic acid level was not elevated -CXR showed "The heart size and mediastinal contours are within normal limits. Both lungs are clear.  Surgical clips are seen along the lateral aspect of the right hemithorax and within the right upper quadrant. The visualized skeletal structures are unremarkable."  -CTA showed "No pulmonary emboli. Bilateral lower lobe pneumonia, right more than left. Tiny amount of pleural fluid on the right. Apparent stenosis of the innominate vein, with multiple collateral vessels in the superior mediastinum and neck.." -Fluid bolus 500 mL in the ED and will give a maintenance IV fluids at 75 mL/h; Now IVF to stop -Covid testing was negative -Blood cultures obtained in the ED and will need to follow-up on them -Suspect component of Aspiration given Vomiting after Surgery -We will add flutter valve, incentive spirometry as well as guaifenesin 1200 mg p.o. twice daily; Was not given the flutter valve and incentive yesterday  -Empiric antibiotic treatment with IV ceftriaxone and azithromycin; she was given some IV Zosyn in the ED; Abx changed to po for D/C -We will provide DuoNebs as neededq4h -Check sputum culture -Continue to monitor respiratory status carefully and repeat chest x-ray in a.m asa bove -Follow up with PCP within 1 week and repeat CXR in3-6 weeks  Hypokalemia -Mild.Patient's potassium was 4.0 -Mag Level was 2.1; -Continue to monitor and trend replete as necessary -repeat CMP in a.m.  Hyperbilirubinemia -Mild and Reactive -T Bili went from 2.2 -> 1.5 -> 1.3 -Continue to Monitor and Trend -Repeat CMP in the AM   NormocyticAnemia -Patient hemoglobin/hematocrit went from 12.2/36.0 -> 11.1/31.1 -> 11.7/33.1 -> 10.7/30.5 -Possible dilutional drop in hemoconcentration from her nausea vomiting -Checked Anemia Panel and showed an iron level of 17, U IBC of 281, TIBC of 298, saturation ratio of 6%, ferritin level 131, folate level 23.3, vitamin B12 467 -Will start patient on Iron Polysaccharides  -Continue to monitor for signs and symptoms of bleeding; currently no overt bleeding  noted -Repeat CBC in a.m.  History of Crohn's disease and breast cancer -Both are currently in remission -Follow up closely as an outpatient   Recent abdominoplasty liposuction by Dr. Marla Roe -Has a girdle in place currently -Continue to monitor and if necessary will consult Dr.Dillingham  Discharge Instructions  Discharge Instructions    Call MD for:  difficulty breathing, headache or visual disturbances   Complete by: As directed    Call MD for:  extreme fatigue   Complete by: As directed    Call MD for:  hives   Complete by: As directed    Call MD for:  persistant dizziness or light-headedness   Complete by: As directed    Call MD for:  persistant nausea and vomiting   Complete by: As directed    Call MD for:  redness, tenderness, or signs of infection (pain, swelling, redness, odor or green/yellow discharge around incision site)   Complete by: As directed    Call MD for:  severe uncontrolled pain   Complete by: As directed    Call MD for:  temperature >100.4   Complete by: As directed    Diet - low sodium heart healthy   Complete by: As directed    Discharge instructions   Complete by: As directed    You were cared for by a hospitalist during your hospital stay. If you have any questions about your discharge medications or the care you received while you were  in the hospital after you are discharged, you can call the unit and ask to speak with the hospitalist on call if the hospitalist that took care of you is not available. Once you are discharged, your primary care physician will handle any further medical issues. Please note that NO REFILLS for any discharge medications will be authorized once you are discharged, as it is imperative that you return to your primary care physician (or establish a relationship with a primary care physician if you do not have one) for your aftercare needs so that they can reassess your need for medications and monitor your lab  values.  Follow up with PCP and Plastic Surgery within 1-2 weeks. Take all medications as prescribed. If symptoms change or worsen please return to the ED for evaluation   Increase activity slowly   Complete by: As directed      Allergies as of 01/03/2021   No Known Allergies     Medication List    TAKE these medications   acetaminophen 325 MG tablet Commonly known as: TYLENOL Take 2 tablets (650 mg total) by mouth every 6 (six) hours as needed for mild pain (or Fever >/= 101).   Align 4 MG Caps Take 1 capsule by mouth daily.   azithromycin 500 MG tablet Commonly known as: ZITHROMAX Take 1 tablet (500 mg total) by mouth daily for 3 days.   BIOTIN PO Take 1 tablet by mouth daily.   cefdinir 300 MG capsule Commonly known as: OMNICEF Take 1 capsule (300 mg total) by mouth 2 (two) times daily for 3 days.   guaiFENesin 600 MG 12 hr tablet Commonly known as: MUCINEX Take 1 tablet (600 mg total) by mouth 2 (two) times daily.   HYDROCODONE-ACETAMINOPHEN PO Take 1 tablet by mouth at bedtime as needed (sleep, post-surgery).   hyoscyamine 0.125 MG tablet Commonly known as: LEVSIN Take 0.125 mg by mouth once as needed for cramping.   iron polysaccharides 150 MG capsule Commonly known as: NIFEREX Take 1 capsule (150 mg total) by mouth daily.   multivitamin tablet Take 1 tablet by mouth daily.   ondansetron 4 MG disintegrating tablet Commonly known as: ZOFRAN-ODT Take 1 tablet (4 mg total) by mouth every 8 (eight) hours as needed for nausea or vomiting.   Prevalite 4 g packet Generic drug: cholestyramine light Take 4 g by mouth once as needed (cramping).   vitamin B-12 100 MCG tablet Commonly known as: CYANOCOBALAMIN Take 100 mcg by mouth daily.   Vitamin D-3 125 MCG (5000 UT) Tabs Take 1 tablet by mouth See admin instructions. Monday through Friday       Follow-up Information    Nunzio Cobbs, MD. Call.   Specialty: Obstetrics and Gynecology Why:  Follow up within 1 week Contact information: [place] [address] [phone]              No Known Allergies  Consultations:  None  Procedures/Studies: DG Chest 2 View  Result Date: 12/31/2020 CLINICAL DATA:  Vomiting and fever. EXAM: CHEST - 2 VIEW COMPARISON:  December 27, 2020 FINDINGS: The heart size and mediastinal contours are within normal limits. Both lungs are clear. Surgical clips are seen along the lateral aspect of the right hemithorax and within the right upper quadrant. The visualized skeletal structures are unremarkable. IMPRESSION: No active cardiopulmonary disease. Electronically Signed   By: Virgina Norfolk M.D.   On: 12/31/2020 22:39   CT Angio Chest PE  W/Cm &/Or Wo Cm  Result Date: 01/01/2021 CLINICAL DATA:  Cough, fever and tachycardia. EXAM: CT ANGIOGRAPHY CHEST WITH CONTRAST TECHNIQUE: Multidetector CT imaging of the chest was performed using the standard protocol during bolus administration of intravenous contrast. Multiplanar CT image reconstructions and MIPs were obtained to evaluate the vascular anatomy. CONTRAST:  54m OMNIPAQUE IOHEXOL 350 MG/ML SOLN COMPARISON:  Chest radiography same day FINDINGS: Cardiovascular: Heart size upper limits of normal. No coronary artery calcification is visible. No aortic atherosclerotic calcification is visible. Pulmonary arterial opacification is moderate. No visible pulmonary emboli. There is apparent stenosis of the innominate vein, with multiple collateral vessels in the superior mediastinum and neck. Mediastinum/Nodes: No mediastinal or hilar mass or lymphadenopathy. Lungs/Pleura: Bilateral lower lobe pneumonia, right more than left. Tiny amount of pleural fluid on the right. Upper Abdomen: Normal.  Previous cholecystectomy. Musculoskeletal: No significant bone finding. Review of the MIP images confirms the above findings. IMPRESSION: 1. No pulmonary emboli. 2. Bilateral lower lobe  pneumonia, right more than left. Tiny amount of pleural fluid on the right. 3. Apparent stenosis of the innominate vein, with multiple collateral vessels in the superior mediastinum and neck. Electronically Signed   By: MNelson ChimesM.D.   On: 01/01/2021 01:54   DG CHEST PORT 1 VIEW  Result Date: 01/03/2021 CLINICAL DATA:  Shortness of breath, chest pain. History of aspiration pneumonia following surgery last week. History of breast cancer. EXAM: PORTABLE CHEST 1 VIEW COMPARISON:  Chest x-ray 01/02/2021, CT chest 01/01/2021 FINDINGS: The heart size and mediastinal contours are within normal limits. Vague bibasilar streaky airspace opacities. No pulmonary edema. No pleural effusion. No pneumothorax. No acute osseous abnormality. Surgical clips overlie the right axilla. IMPRESSION: Vague bibasilar streaky airspace opacities could represent a combination of atelectasis and/or infection/pneumonia. Electronically Signed   By: MIven FinnM.D.   On: 01/03/2021 06:15   DG CHEST PORT 1 VIEW  Result Date: 01/02/2021 CLINICAL DATA:  Shortness of breath EXAM: PORTABLE CHEST 1 VIEW COMPARISON:  December 31, 2020 chest radiograph; chest CT January 01, 2021. FINDINGS: There is slight increased opacity in the lung bases which is less apparent by radiography than on chest CT from 1 day prior. Elsewhere lungs are clear. Heart size and pulmonary vascularity are normal. No adenopathy. There are surgical clips in the right axillary region. IMPRESSION: Slight increased opacity in the bases consistent with atelectasis or infiltrate. Opacity less prominent in bases compared to CT from 1 day prior. No new opacity evident. Lungs elsewhere clear. Cardiac silhouette normal. Electronically Signed   By: WLowella GripIII M.D.   On: 01/02/2021 09:04    Subjective: Seen and examined at bedside and was feeling well today.  Coughing up quite a bit and was having some productive sputum.  No chest pain, lightheadedness or  dizziness.  No nausea or vomiting.  No other concerns or complaints at this time.  For discharge home and will need to follow-up with PCP and repeat chest x-ray in 3 to 6 weeks.  She understands and agrees with plan of care  Discharge Exam: Vitals:   01/02/21 1500 01/02/21 1945  BP:  135/87  Pulse:    Resp:  18  Temp:  99.2 F (37.3 C)  SpO2: 90%    Vitals:   01/02/21 0945 01/02/21 1500 01/02/21 1945 01/03/21 1100  BP:   135/87   Pulse:      Resp:   18   Temp:   99.2 F (37.3 C)   TempSrc:  Oral   SpO2: (!) 78% 90%    Weight:    71.7 kg  Height:    5' 7"  (1.702 m)   General: Pt is alert, awake, not in acute distress Cardiovascular: RRR, S1/S2 +, no rubs, no gallops Respiratory: Diminished bilaterally at the bases, no wheezing, no rhonchi; unlabored breathing does have some coarse breath sounds Abdominal: Soft, NT, ND, bowel sounds + Extremities: no edema, no cyanosis  The results of significant diagnostics from this hospitalization (including imaging, microbiology, ancillary and laboratory) are listed below for reference.    Microbiology: Recent Results (from the past 240 hour(s))  Culture, blood (Routine x 2)     Status: None (Preliminary result)   Collection Time: 12/31/20  9:30 PM   Specimen: BLOOD LEFT ARM  Result Value Ref Range Status   Specimen Description BLOOD LEFT ARM  Final   Special Requests   Final    BOTTLES DRAWN AEROBIC AND ANAEROBIC Blood Culture adequate volume   Culture   Final    NO GROWTH 2 DAYS Performed at [facility], [address] [place], [place] [address]    Report Status PENDING  Incomplete  Culture, blood (Routine x 2)     Status: None (Preliminary result)   Collection Time: 12/31/20  9:56 PM   Specimen: BLOOD RIGHT ARM  Result Value Ref Range Status   Specimen Description BLOOD RIGHT ARM  Final   Special Requests   Final    BOTTLES DRAWN AEROBIC ONLY Blood Culture adequate volume   Culture   Final    NO GROWTH 2  DAYS Performed at [facility], [place] [address] [place], [place] [address]    Report Status PENDING  Incomplete  Resp Panel by RT-PCR (Flu A&B, Covid) Nasopharyngeal Swab     Status: None   Collection Time: 01/01/21  5:07 AM   Specimen: Nasopharyngeal Swab; Nasopharyngeal(NP) swabs in vial transport medium  Result Value Ref Range Status   SARS Coronavirus 2 by RT PCR NEGATIVE NEGATIVE Final    Comment: (NOTE) SARS-CoV-2 target nucleic acids are NOT DETECTED.  The SARS-CoV-2 RNA is generally detectable in upper respiratory specimens during the acute phase of infection. The lowest concentration of SARS-CoV-2 viral copies this assay can detect is 138 copies/mL. A negative result does not preclude SARS-Cov-2 infection and should not be used as the sole basis for treatment or other patient management decisions. A negative result may occur with  improper specimen collection/handling, submission of specimen other than nasopharyngeal swab, presence of viral mutation(s) within the areas targeted by this assay, and inadequate number of viral copies(<138 copies/mL). A negative result must be combined with clinical observations, patient history, and epidemiological information. The expected result is Negative.  Fact Sheet for Patients:  EntrepreneurPulse.com.au  Fact Sheet for Healthcare Providers:  IncredibleEmployment.be  This test is no t yet approved or cleared by the [place] FDA and  has been authorized for detection and/or diagnosis of SARS-CoV-2 by FDA under an Emergency Use Authorization (EUA). This EUA will remain  in effect (meaning this test can be used) for the duration of the COVID-19 declaration under Section 564(b)(1) of the Act, 21 U.S.C.section 360bbb-3(b)(1), unless the authorization is terminated  or revoked sooner.       Influenza A by PCR NEGATIVE NEGATIVE Final   Influenza B by PCR NEGATIVE NEGATIVE Final    Comment:  (NOTE) The Xpert Xpress SARS-CoV-2/FLU/RSV plus assay is intended as an aid in the diagnosis of  influenza from Nasopharyngeal swab specimens and should not be used as a sole basis for treatment. Nasal washings and aspirates are unacceptable for Xpert Xpress SARS-CoV-2/FLU/RSV testing.  Fact Sheet for Patients: EntrepreneurPulse.com.au  Fact Sheet for Healthcare Providers: IncredibleEmployment.be  This test is not yet approved or cleared by the [place] FDA and has been authorized for detection and/or diagnosis of SARS-CoV-2 by FDA under an Emergency Use Authorization (EUA). This EUA will remain in effect (meaning this test can be used) for the duration of the COVID-19 declaration under Section 564(b)(1) of the Act, 21 U.S.C. section 360bbb-3(b)(1), unless the authorization is terminated or revoked.  Performed at [facility], [place] [address], [place], [place] [address]     Labs: BNP (last 3 results) No results for input(s): BNP in the last 8760 hours. Basic Metabolic Panel: Recent Labs  Lab 12/31/20 2154 01/01/21 0551 01/01/21 1025 01/02/21 0231 01/03/21 0128  NA 136  --  140 138 140  K 3.2*  --  3.5 3.2* 4.0  CL 99  --  103 103 106  CO2 26  --  28 25 26   GLUCOSE 127*  --  102* 119* 98  BUN 6  --  5* <5* 5*  CREATININE 0.69 0.60 0.64 0.63 0.57  CALCIUM 9.5  --  8.9 9.0 9.0  MG  --   --  2.1 1.7 2.1  PHOS  --   --  3.6 3.3 3.4   Liver Function Tests: Recent Labs  Lab 12/31/20 2154 01/01/21 1025 01/02/21 0231 01/03/21 0128  AST 21 17 16  14*  ALT 17 14 14 12   ALKPHOS 62 57 62 55  BILITOT 2.0* 2.2* 1.5* 1.3*  PROT 6.7 5.9* 6.3* 5.9*  ALBUMIN 3.9 3.2* 3.4* 3.0*   No results for input(s): LIPASE, AMYLASE in the last 168 hours. No results for input(s): AMMONIA in the last 168 hours. CBC: Recent Labs  Lab 12/31/20 2154 01/01/21 0551 01/02/21 0231 01/03/21 0128  WBC 9.8 9.1 9.1 6.8  NEUTROABS 8.6*  --  7.0 4.6   HGB 12.2 11.1* 11.7* 10.7*  HCT 36.0 31.1* 33.1* 30.5*  MCV 93.8 94.0 92.7 93.0  PLT 233 202 235 231   Cardiac Enzymes: No results for input(s): CKTOTAL, CKMB, CKMBINDEX, TROPONINI in the last 168 hours. BNP: Invalid input(s): POCBNP CBG: No results for input(s): GLUCAP in the last 168 hours. D-Dimer No results for input(s): DDIMER in the last 72 hours. Hgb A1c No results for input(s): HGBA1C in the last 72 hours. Lipid Profile No results for input(s): CHOL, HDL, LDLCALC, TRIG, CHOLHDL, LDLDIRECT in the last 72 hours. Thyroid function studies No results for input(s): TSH, T4TOTAL, T3FREE, THYROIDAB in the last 72 hours.  Invalid input(s): FREET3 Anemia work up Recent Labs    01/02/21 0231  VITAMINB12 467  FOLATE 23.3  FERRITIN 131  TIBC 298  IRON 17*  RETICCTPCT 2.0   Urinalysis    Component Value Date/Time   COLORURINE YELLOW 12/31/2020 2140   APPEARANCEUR HAZY (A) 12/31/2020 2140   LABSPEC 1.021 12/31/2020 2140   LABSPEC 1.010 03/04/2014 1257   PHURINE 5.0 12/31/2020 2140   GLUCOSEU NEGATIVE 12/31/2020 2140   GLUCOSEU Negative 03/04/2014 1257   HGBUR SMALL (A) 12/31/2020 2140   BILIRUBINUR NEGATIVE 12/31/2020 2140   BILIRUBINUR n 06/28/2017 1453   BILIRUBINUR Negative 03/04/2014 1257   KETONESUR 20 (A) 12/31/2020 2140   PROTEINUR NEGATIVE 12/31/2020 2140   UROBILINOGEN 0.2 06/28/2017 1453   UROBILINOGEN 0.2 03/04/2014 1257  NITRITE NEGATIVE 12/31/2020 2140   LEUKOCYTESUR NEGATIVE 12/31/2020 2140   LEUKOCYTESUR Trace 03/04/2014 1257   Sepsis Labs Invalid input(s): PROCALCITONIN,  WBC,  LACTICIDVEN Microbiology Recent Results (from the past 240 hour(s))  Culture, blood (Routine x 2)     Status: None (Preliminary result)   Collection Time: 12/31/20  9:30 PM   Specimen: BLOOD LEFT ARM  Result Value Ref Range Status   Specimen Description BLOOD LEFT ARM  Final   Special Requests   Final    BOTTLES DRAWN AEROBIC AND ANAEROBIC Blood Culture adequate  volume   Culture   Final    NO GROWTH 2 DAYS Performed at [facility], [place] [address], [place], [place] [address]    Report Status PENDING  Incomplete  Culture, blood (Routine x 2)     Status: None (Preliminary result)   Collection Time: 12/31/20  9:56 PM   Specimen: BLOOD RIGHT ARM  Result Value Ref Range Status   Specimen Description BLOOD RIGHT ARM  Final   Special Requests   Final    BOTTLES DRAWN AEROBIC ONLY Blood Culture adequate volume   Culture   Final    NO GROWTH 2 DAYS Performed at [facility], [place] [address] [place], [place] [address]    Report Status PENDING  Incomplete  Resp Panel by RT-PCR (Flu A&B, Covid) Nasopharyngeal Swab     Status: None   Collection Time: 01/01/21  5:07 AM   Specimen: Nasopharyngeal Swab; Nasopharyngeal(NP) swabs in vial transport medium  Result Value Ref Range Status   SARS Coronavirus 2 by RT PCR NEGATIVE NEGATIVE Final    Comment: (NOTE) SARS-CoV-2 target nucleic acids are NOT DETECTED.  The SARS-CoV-2 RNA is generally detectable in upper respiratory specimens during the acute phase of infection. The lowest concentration of SARS-CoV-2 viral copies this assay can detect is 138 copies/mL. A negative result does not preclude SARS-Cov-2 infection and should not be used as the sole basis for treatment or other patient management decisions. A negative result may occur with  improper specimen collection/handling, submission of specimen other than nasopharyngeal swab, presence of viral mutation(s) within the areas targeted by this assay, and inadequate number of viral copies(<138 copies/mL). A negative result must be combined with clinical observations, patient history, and epidemiological information. The expected result is Negative.  Fact Sheet for Patients:  EntrepreneurPulse.com.au  Fact Sheet for Healthcare Providers:  IncredibleEmployment.be  This test is no t yet approved or cleared  by the [place] FDA and  has been authorized for detection and/or diagnosis of SARS-CoV-2 by FDA under an Emergency Use Authorization (EUA). This EUA will remain  in effect (meaning this test can be used) for the duration of the COVID-19 declaration under Section 564(b)(1) of the Act, 21 U.S.C.section 360bbb-3(b)(1), unless the authorization is terminated  or revoked sooner.       Influenza A by PCR NEGATIVE NEGATIVE Final   Influenza B by PCR NEGATIVE NEGATIVE Final    Comment: (NOTE) The Xpert Xpress SARS-CoV-2/FLU/RSV plus assay is intended as an aid in the diagnosis of influenza from Nasopharyngeal swab specimens and should not be used as a sole basis for treatment. Nasal washings and aspirates are unacceptable for Xpert Xpress SARS-CoV-2/FLU/RSV testing.  Fact Sheet for Patients: EntrepreneurPulse.com.au  Fact Sheet for Healthcare Providers: IncredibleEmployment.be  This test is not yet approved or cleared by the [place] FDA and has been authorized for detection and/or diagnosis of SARS-CoV-2 by FDA under an Emergency Use  Authorization (EUA). This EUA will remain in effect (meaning this test can be used) for the duration of the COVID-19 declaration under Section 564(b)(1) of the Act, 21 U.S.C. section 360bbb-3(b)(1), unless the authorization is terminated or revoked.  Performed at [facility], [place] [address], [place], [place] [address]    Time coordinating discharge: 35 minutes  SIGNED:  Kerney Elbe, DO [facility] 01/03/2021, 6:49 PM Pager is on [place]  If 7PM-7AM, please contact night-coverage [email or web address]

## 2021-01-04 ENCOUNTER — Other Ambulatory Visit: Payer: Self-pay

## 2021-01-04 ENCOUNTER — Ambulatory Visit (INDEPENDENT_AMBULATORY_CARE_PROVIDER_SITE_OTHER): Payer: Self-pay | Admitting: Plastic Surgery

## 2021-01-04 ENCOUNTER — Encounter: Payer: Self-pay | Admitting: Plastic Surgery

## 2021-01-04 VITALS — BP 133/87 | HR 85 | Temp 97.8°F

## 2021-01-04 DIAGNOSIS — Z719 Counseling, unspecified: Secondary | ICD-10-CM

## 2021-01-04 NOTE — Progress Notes (Signed)
The patient is a 55 year old female here with her husband for follow-up on her mini abdominoplasty.  She is doing very well from the surgery.  Unfortunately she had a great bit of nausea after the surgery and had an episode of vomiting.  As the week went on she felt very ill and ended up getting admitted with pneumonia.  She is now doing much better she still on the antibiotic.  There is no sign of infection or cellulitis.  The swelling and the bruising is as expected on the abdomen and flank area.  She can shower daily and I had like to see her back in 1 to 2 weeks.

## 2021-01-06 LAB — CULTURE, BLOOD (ROUTINE X 2)
Culture: NO GROWTH
Culture: NO GROWTH
Special Requests: ADEQUATE
Special Requests: ADEQUATE

## 2021-01-09 NOTE — Progress Notes (Signed)
Patient is a 55 year old female here for follow-up after undergoing mini abdominoplasty with liposuction of the flanks, thighs, and mons on 12/27/2020 with Dr. Marla Roe.  ~ 2 weeks PO Patient reports overall she is doing well.  She is concerned with some swelling in the mons area.  Reports she cut the crotch out of her compression garment so it does not cover this area.  Denies fever/chills, nausea/vomiting, pain along the incision site.  Aquacel bandages and Steri-Strips were removed today.  Abdominal incision is intact clean and dry.  No signs of infection, redness, drainage.  She has some mild diffuse swelling throughout the lower abdomen with slightly more in the mons area.  No signs of infection, warmth, tenderness to palpation.  Mild diffuse swelling is as expected at this point in recovery.  Recommend wearing compression over this area.  She may utilize ice avoiding incisions and ibuprofen for swelling.  Follow-up in 2 to 3 weeks.  Return precautions provided.  Call office with any questions/concerns.  Pictures were obtained of the patient and placed in the chart with the patient's or guardian's permission.

## 2021-01-11 ENCOUNTER — Encounter: Payer: Self-pay | Admitting: Plastic Surgery

## 2021-01-11 ENCOUNTER — Other Ambulatory Visit: Payer: Self-pay

## 2021-01-11 ENCOUNTER — Ambulatory Visit (INDEPENDENT_AMBULATORY_CARE_PROVIDER_SITE_OTHER): Payer: Self-pay | Admitting: Plastic Surgery

## 2021-01-11 VITALS — BP 127/84 | HR 83

## 2021-01-11 DIAGNOSIS — Z9889 Other specified postprocedural states: Secondary | ICD-10-CM

## 2021-01-12 DIAGNOSIS — Z79899 Other long term (current) drug therapy: Secondary | ICD-10-CM | POA: Diagnosis not present

## 2021-01-12 DIAGNOSIS — E559 Vitamin D deficiency, unspecified: Secondary | ICD-10-CM | POA: Diagnosis not present

## 2021-01-12 DIAGNOSIS — J13 Pneumonia due to Streptococcus pneumoniae: Secondary | ICD-10-CM | POA: Diagnosis not present

## 2021-01-12 DIAGNOSIS — Z09 Encounter for follow-up examination after completed treatment for conditions other than malignant neoplasm: Secondary | ICD-10-CM | POA: Diagnosis not present

## 2021-01-13 ENCOUNTER — Telehealth: Payer: Self-pay | Admitting: Plastic Surgery

## 2021-01-13 NOTE — Telephone Encounter (Signed)
Patient called to say that she has been really itchy the last couple of days around the incisions where the bandage was on her skin. She thought once they were removed it would stop, but it has actually gotten worse. She took a benadryl last night and it seemed to help, but she wanted to make sure that was okay to continue doing. Also, can she put cortisone on those areas? Please call to advise.

## 2021-01-13 NOTE — Telephone Encounter (Signed)
Called and spoke with the patient regarding the message below.  Informed the patient that I spoke with [facility] and he stated that she can continue with the Benadryl at night,and Allegra or Zyrtec during the day.  Use Vaseline, and using Cortisone is fine (1-2/day), but not on the incision. Patient verbalized understanding and agreed.//AB/CMA

## 2021-02-01 ENCOUNTER — Other Ambulatory Visit: Payer: Self-pay

## 2021-02-01 ENCOUNTER — Ambulatory Visit (INDEPENDENT_AMBULATORY_CARE_PROVIDER_SITE_OTHER): Payer: BC Managed Care – PPO | Admitting: Plastic Surgery

## 2021-02-01 ENCOUNTER — Encounter: Payer: Self-pay | Admitting: Plastic Surgery

## 2021-02-01 ENCOUNTER — Telehealth: Payer: Self-pay

## 2021-02-01 VITALS — BP 147/85 | HR 67

## 2021-02-01 DIAGNOSIS — Z719 Counseling, unspecified: Secondary | ICD-10-CM

## 2021-02-01 NOTE — Progress Notes (Signed)
The patient is a 55 year old female here for follow-up after undergoing a abdominoplasty.  I removed some of the sutures.  She does not appear to have any sign of infection.  No sign of hematoma or seroma.  Overall she is doing very well.  He is wondering if mons area flatter.  Knows to give it a little bit more time to see if the swelling goes down.  She asked about cold sculpting.  That might be a possibility but likely hard for the device to work on that area.  She knows to call with any questions or concerns in the morning happy to see her back.  Pictures were obtained of the patient and placed in the chart with the patient's or guardian's permission.

## 2021-02-01 NOTE — Telephone Encounter (Signed)
Patient called to say she had a couple of questions that she forgot to ask at her appointment today.  Patient said she would like to know how much longer she needs to wear her spanx outfit.  She would also like to know how much longer she needs to sleep on her back with her legs elevated.  Please call.

## 2021-02-07 NOTE — Telephone Encounter (Signed)
Called and spoke with the patient on (02/01/21) regarding her message below.  Informed the patient that I spoke with Dr. Marla Roe and she stated to let pain be her guide for sleeping on her side, and for her not to lay flat for 2 weeks.  Patient verbalized understanding and agreed.//AB/CMA

## 2021-02-08 DIAGNOSIS — Z719 Counseling, unspecified: Secondary | ICD-10-CM

## 2021-02-11 DIAGNOSIS — J13 Pneumonia due to Streptococcus pneumoniae: Secondary | ICD-10-CM | POA: Diagnosis not present

## 2021-03-09 ENCOUNTER — Other Ambulatory Visit: Payer: BC Managed Care – PPO

## 2021-04-20 DIAGNOSIS — D2261 Melanocytic nevi of right upper limb, including shoulder: Secondary | ICD-10-CM | POA: Diagnosis not present

## 2021-04-20 DIAGNOSIS — D225 Melanocytic nevi of trunk: Secondary | ICD-10-CM | POA: Diagnosis not present

## 2021-04-20 DIAGNOSIS — L821 Other seborrheic keratosis: Secondary | ICD-10-CM | POA: Diagnosis not present

## 2021-04-20 DIAGNOSIS — D1801 Hemangioma of skin and subcutaneous tissue: Secondary | ICD-10-CM | POA: Diagnosis not present

## 2021-07-04 ENCOUNTER — Ambulatory Visit
Admission: RE | Admit: 2021-07-04 | Discharge: 2021-07-04 | Disposition: A | Discharge status: Home / Self Care | Payer: BC Managed Care – PPO | Source: Ambulatory Visit | Attending: Obstetrics and Gynecology | Admitting: Obstetrics and Gynecology

## 2021-07-04 ENCOUNTER — Encounter: Payer: Self-pay | Admitting: Obstetrics and Gynecology

## 2021-07-04 ENCOUNTER — Other Ambulatory Visit: Payer: Self-pay

## 2021-07-04 DIAGNOSIS — Z780 Asymptomatic menopausal state: Secondary | ICD-10-CM | POA: Diagnosis not present

## 2021-07-04 DIAGNOSIS — M810 Age-related osteoporosis without current pathological fracture: Secondary | ICD-10-CM | POA: Diagnosis not present

## 2021-07-05 NOTE — Telephone Encounter (Signed)
Dr. Quincy Simmonds, The appointment she has scheduled is her AEX. Would you prefer she have separate consult visit to discuss results/recommendation?

## 2021-07-11 ENCOUNTER — Other Ambulatory Visit: Payer: Self-pay | Admitting: Obstetrics and Gynecology

## 2021-07-11 DIAGNOSIS — Z1231 Encounter for screening mammogram for malignant neoplasm of breast: Secondary | ICD-10-CM

## 2021-08-16 DIAGNOSIS — K591 Functional diarrhea: Secondary | ICD-10-CM | POA: Diagnosis not present

## 2021-08-16 DIAGNOSIS — K5000 Crohn's disease of small intestine without complications: Secondary | ICD-10-CM | POA: Diagnosis not present

## 2021-08-22 ENCOUNTER — Other Ambulatory Visit: Payer: BC Managed Care – PPO

## 2021-08-25 ENCOUNTER — Encounter: Payer: Self-pay | Admitting: Podiatry

## 2021-08-25 ENCOUNTER — Other Ambulatory Visit: Payer: Self-pay

## 2021-08-25 ENCOUNTER — Ambulatory Visit (INDEPENDENT_AMBULATORY_CARE_PROVIDER_SITE_OTHER): Payer: BC Managed Care – PPO

## 2021-08-25 ENCOUNTER — Ambulatory Visit (INDEPENDENT_AMBULATORY_CARE_PROVIDER_SITE_OTHER): Payer: BC Managed Care – PPO | Admitting: Podiatry

## 2021-08-25 DIAGNOSIS — M722 Plantar fascial fibromatosis: Secondary | ICD-10-CM | POA: Diagnosis not present

## 2021-08-25 MED ORDER — TRIAMCINOLONE ACETONIDE 10 MG/ML IJ SUSP
10.0000 mg | Freq: Once | INTRAMUSCULAR | Status: AC
  Administered 2021-08-25: 10 mg

## 2021-08-28 NOTE — Progress Notes (Signed)
Subjective:   Patient ID: Janice [name], female   DOB: 55 y.o.   MRN: [id number]   HPI Patient presents stating that her heel pain has intensified left over right and she tries to manage it and has had long-term orthotics but needs a new pair as its been 7 years.  Patient has for the most part been okay but does have acute flareup left noted now and does not smoke likes to be active   Review of Systems  All other systems reviewed and are negative.      Objective:  Physical Exam Vitals and nursing note reviewed.  Constitutional:      Appearance: She is well-developed.  Pulmonary:     Effort: Pulmonary effort is normal.  Musculoskeletal:        General: Normal range of motion.  Skin:    General: Skin is warm.  Neurological:     Mental Status: She is alert.    Neurovascular status found to be intact muscle strength was found to be adequate range of motion adequate.  Patient is found to have significant inflammation plantar heel left at the insertional point tendon calcaneus with moderate depression of the arch bilateral with good digital perfusion well oriented     Assessment:  Chronic Planter fasciitis left over right with long-term history of orthotics which have lost some of their support with no other pathology     Plan:  Acute condition discussed and today Terrence Dupont to focus on this and I did sterile prep and injected the fascia 3 mg Kenalog 5 mg Xylocaine at insertion.  Discussed long-term orthotics and she is casted for functional orthotics today and will be seen back when returned  X-rays indicate that there is moderate depression of the arch bilateral small spur formation no indication stress fracture arthritis signed this

## 2021-08-30 DIAGNOSIS — M65332 Trigger finger, left middle finger: Secondary | ICD-10-CM | POA: Diagnosis not present

## 2021-09-15 ENCOUNTER — Other Ambulatory Visit: Payer: Self-pay

## 2021-09-15 ENCOUNTER — Ambulatory Visit (INDEPENDENT_AMBULATORY_CARE_PROVIDER_SITE_OTHER): Payer: BC Managed Care – PPO | Admitting: Family Medicine

## 2021-09-15 ENCOUNTER — Encounter: Payer: Self-pay | Admitting: Family Medicine

## 2021-09-15 VITALS — BP 128/66 | HR 74 | Temp 98.2°F | Resp 16 | Ht 67.0 in | Wt 159.6 lb

## 2021-09-15 DIAGNOSIS — Z853 Personal history of malignant neoplasm of breast: Secondary | ICD-10-CM

## 2021-09-15 DIAGNOSIS — M810 Age-related osteoporosis without current pathological fracture: Secondary | ICD-10-CM

## 2021-09-15 DIAGNOSIS — K50119 Crohn's disease of large intestine with unspecified complications: Secondary | ICD-10-CM

## 2021-09-15 DIAGNOSIS — D509 Iron deficiency anemia, unspecified: Secondary | ICD-10-CM

## 2021-09-15 DIAGNOSIS — Z13220 Encounter for screening for lipoid disorders: Secondary | ICD-10-CM

## 2021-09-15 NOTE — Progress Notes (Signed)
Subjective:  Patient ID: Janice [name], female    DOB: [birth year]  Age: 55 y.o. MRN: [id number]  CC:  Chief Complaint  Patient presents with   New Patient (Initial Visit)    Pt here for new patient visit to establish care, no concerns     HPI Janice [name] presents for   New patient to establish care. No recent PCP, OB/GYN Dr. Quincy Simmonds Ascus pap in past. AUB, hysteroscopy with polyp in past and D&C  Surgical history: Laparoscopic cholecystectomy 2003. Breast cancer, right breast, ER positive, lumpectomy with multiple axillary sentinel node biopsies and dissection in 2007, radiation, chemo, tamoxifen for 10 years. Mammogram scheduled 09/20/21.  Trigger finger release - R hand 3 yrs ago, recent treatment on L hand.   History of Crohn's Disease.  Dr. Cristina Gong, now retired - will be establishing with new GI.  Treated with hyoscyamine as needed, prevalite as needed - not needed very often. Crohn's stable - better since chemo. Align daily.   Pneumonia in 12/2020. Post abdominoplasty, emesis. Doing well since.  Immunization History  Administered Date(s) Administered   Tdap 08/12/2014  Discussed pneumonia vaccine - declined Flu vaccine - declined Covid vaccine - has not had - declined Shingrix: had shingles disease about 10 years ago.   Osteoporosis: Prior osteopenia, recent test indicated osteoporosis. -2.6 at LS spine, -2.5 on femur neck.  Has started caltrate, weight based exercise 3d/week. Prior walking.  Will discuss with Dr. Quincy Simmonds in few weeks.   Hx of anemia: No current iron supplement, on 30 days iron after pneumonia in 12/2020.  Lab Results  Component Value Date   WBC 6.8 01/03/2021   HGB 10.7 (L) 01/03/2021   HCT 30.5 (L) 01/03/2021   MCV 93.0 01/03/2021   PLT 231 01/03/2021   Lab Results  Component Value Date   IRON 17 (L) 01/02/2021   TIBC 298 01/02/2021   FERRITIN 131 01/02/2021    Hyperlipidemia: Prior elevated triglycerides - improved after  diet/exercise. No meds.  Lab Results  Component Value Date   CHOL 172 09/29/2020   HDL 67 09/29/2020   LDLCALC 82 09/29/2020   TRIG 134 09/29/2020   CHOLHDL 2.6 09/29/2020   Lab Results  Component Value Date   ALT 12 01/03/2021   AST 14 (L) 01/03/2021   ALKPHOS 55 01/03/2021   BILITOT 1.3 (H) 01/03/2021      History Patient Active Problem List   Diagnosis Date Noted   Pneumonia 01/01/2021   CAP (community acquired pneumonia) 01/01/2021   Encounter for counseling 10/19/2020   Malignant neoplasm of upper-outer quadrant of right breast in female, estrogen receptor positive ([place]) 09/05/2017   Breast cancer, right breast ([place]) 03/08/2015   Dysuria 03/04/2014   Past Medical History:  Diagnosis Date   Anemia    Arthritis    Atypical squamous cell changes of undetermined significance (ASCUS) on cervical cytology with negative high risk human papilloma virus (HPV) test result 2018   Breast cancer ([place])    right   Cancer ([place]) 02/22/2006   rt breast cancer   Crohn's disease ([place]) dx 2004  Crohn's ileitis   04-14-2003 resection terminal ileum, cecum, and appendix   History of breast cancer oncologist-  dr Jana Hakim---  no recurrence (lov 11-29-2016)   dx 04/ 2007 --  Right breast carcinoma , invasive DCIS, Stage IIB, Grade 2 (T1c N1) ER/PR positive, HER-2 negative--  03-12-2006  s/p  right lumpectomy w/ sln bx's and dissection --  adjuvant chemo and  radiation therapy completed 01/ 2008-- completed tamoxifen 01/ 2018   History of cancer chemotherapy completed 01/ 2008   right breast   History of genital warts    as teenager--  per pt frozen and did not return   Hypertriglyceridemia    Osteopenia 2018   Osteoporosis    Personal history of chemotherapy    Personal history of radiation therapy completed 01/ 2008   right breast   PMB (postmenopausal bleeding) 06/07/2017   Shingles    Past Surgical History:  Procedure Laterality Date   BREAST LUMPECTOMY Right    COLONOSCOPY      D & C HYSTEROSCOPIC RESECTION POLYP  11-29-2009   dr Joan Flores   HYSTEROSCOPY WITH D & C [name] 06/14/2017   Procedure: DILATATION AND CURETTAGE /HYSTEROSCOPY;  Surgeon: Nunzio Cobbs, MD;  Location: [facility];  Service: Gynecology;  Laterality: [name];   LAPAROSCOPIC CHOLECYSTECTOMY  09/16/2002   PORT-A-CATH REMOVAL  01/03/2007   RESECTION TERMINAL ILEUM, CECUM, AND APPENDIX  04-14-2003   dr Excell Seltzer   Crohn's ileitis   RIGHT BREAST LUMPECTOMY W/ MULTIPLE AXILLARY SENTINAL NODE BX'S AND DISSECTION  03/12/2006   TRANSTHORACIC ECHOCARDIOGRAM  04/05/2006   ef 55-60%/  trivial MR and TR   No Known Allergies Prior to Admission medications   Medication Sig Start Date End Date Taking? Authorizing Provider  BIOTIN PO Take 1 tablet by mouth daily.   Yes [provider]  Calcium-Magnesium-Vitamin D (CITRACAL CALCIUM+D) 600-40-500 MG-MG-UNIT TB24  07/04/21  Yes [provider]  Cholecalciferol (VITAMIN D-3) 125 MCG (5000 UT) TABS Take 1 tablet by mouth See admin instructions. Monday through Friday   Yes [provider]  Cyanocobalamin (VITAMIN B 12) 500 MCG TABS Take 1,000 mcg by mouth daily. 07/22/19  Yes [provider]  hyoscyamine (LEVSIN) 0.125 MG tablet Take 0.125 mg by mouth once as needed for cramping.   Yes [provider]  Multiple Vitamin (MULTIVITAMIN) tablet Take 1 tablet by mouth daily.   Yes [provider]  PREVALITE 4 g packet Take 4 g by mouth once as needed (cramping). 10/22/20  Yes [provider]  Probiotic Product (ALIGN) 4 MG CAPS Take 1 capsule by mouth daily.   Yes [provider]   Social History   Socioeconomic History   Marital status: Married    Spouse name: Not on file   Number of children: Not on file   Years of education: Not on file   Highest education level: Not on file  Occupational History   Not on file  Tobacco Use   Smoking status: Never   Smokeless tobacco: Never   Vaping Use   Vaping Use: Never used  Substance and Sexual Activity   Alcohol use: Yes    Comment: social   Drug use: Never   Sexual activity: Yes    Partners: Male    Birth control/protection: Post-menopausal    Comment: husband with vasectomy  Other Topics Concern   Not on file  Social History Narrative   Not on file   Social Determinants of Health   Financial Resource Strain: Not on file  Food Insecurity: Not on file  Transportation Needs: Not on file  Physical Activity: Not on file  Stress: Not on file  Social Connections: Not on file  Intimate Partner Violence: Not on file    Review of Systems  Per HPI.  Objective:   Vitals:   09/15/21 1452  BP: 128/66  Pulse: 74  Resp: 16  Temp: 98.2 F (36.8 C)  TempSrc: Temporal  SpO2: 98%  Weight: 159 lb 9.6 oz (72.4 kg)  Height: 5' 7"  (1.702 m)    Physical Exam Vitals reviewed.  Constitutional:      Appearance: Normal appearance. She is well-developed.  HENT:     Head: Normocephalic and atraumatic.  Eyes:     Conjunctiva/sclera: Conjunctivae normal.     Pupils: Pupils are equal, round, and reactive to light.  Neck:     Vascular: No carotid bruit.  Cardiovascular:     Rate and Rhythm: Normal rate and regular rhythm.     Heart sounds: Normal heart sounds.  Pulmonary:     Effort: Pulmonary effort is normal.     Breath sounds: Normal breath sounds.  Abdominal:     Palpations: Abdomen is soft. There is no pulsatile mass.     Tenderness: There is no abdominal tenderness.  Musculoskeletal:     Right lower leg: No edema.     Left lower leg: No edema.  Skin:    General: Skin is warm and dry.  Neurological:     Mental Status: She is alert and oriented to person, place, and time.  Psychiatric:        Mood and Affect: Mood normal.        Behavior: Behavior normal.     Assessment & Plan:  Janice [name] is a 55 y.o. female . Osteoporosis without current pathological fracture, unspecified osteoporosis  type  -Follow-up planned with GYN to discuss treatment options, but continue calcium, vitamin D for now.  Iron deficiency anemia, unspecified iron deficiency anemia type - Plan: CBC, Iron  -Check labs for recommendations on supplementation.  Screening for hyperlipidemia - Plan: Comprehensive metabolic panel, Lipid panel  History of breast cancer  -Ongoing screening with mammograms.  Crohn's disease of colon with complication ([place])  -Stable symptoms currently, RTC precautions  No orders of the defined types were placed in this encounter.  Patient Instructions  Keep follow-up with specialist as planned.  I will check iron level, blood counts, electrolytes, cholesterol levels today.  Keep up the good work with calcium, vitamin D, and exercise for osteoporosis but can discuss other options with your gynecologist.  Follow-up in 6 months for physical and can recheck labs if needed at that time.  I will let you know if there are concerns on the levels today.  Thanks for coming in today and let me know if there are questions.    Signed,   Merri Ray, MD [place] 09/15/21 4:16 PM

## 2021-09-15 NOTE — Patient Instructions (Signed)
Keep follow-up with specialist as planned.  I will check iron level, blood counts, electrolytes, cholesterol levels today.  Keep up the good work with calcium, vitamin D, and exercise for osteoporosis but can discuss other options with your gynecologist.  Follow-up in 6 months for physical and can recheck labs if needed at that time.  I will let you know if there are concerns on the levels today.  Thanks for coming in today and let me know if there are questions.

## 2021-09-16 ENCOUNTER — Ambulatory Visit (INDEPENDENT_AMBULATORY_CARE_PROVIDER_SITE_OTHER): Payer: BC Managed Care – PPO | Admitting: Podiatrist

## 2021-09-16 DIAGNOSIS — M722 Plantar fascial fibromatosis: Secondary | ICD-10-CM

## 2021-09-16 LAB — CBC
HCT: 40.9 % (ref 36.0–46.0)
Hemoglobin: 14 g/dL (ref 12.0–15.0)
MCHC: 34.3 g/dL (ref 30.0–36.0)
MCV: 92.7 fl (ref 78.0–100.0)
Platelets: 204 10*3/uL (ref 150.0–400.0)
RBC: 4.42 Mil/uL (ref 3.87–5.11)
RDW: 12.8 % (ref 11.5–15.5)
WBC: 5.5 10*3/uL (ref 4.0–10.5)

## 2021-09-16 LAB — COMPREHENSIVE METABOLIC PANEL
ALT: 12 U/L (ref 0–35)
AST: 16 U/L (ref 0–37)
Albumin: 4.7 g/dL (ref 3.5–5.2)
Alkaline Phosphatase: 80 U/L (ref 39–117)
BUN: 13 mg/dL (ref 6–23)
CO2: 29 mEq/L (ref 19–32)
Calcium: 9.8 mg/dL (ref 8.4–10.5)
Chloride: 102 mEq/L (ref 96–112)
Creatinine, Ser: 0.67 mg/dL (ref 0.40–1.20)
GFR: 97.68 mL/min (ref >60.00)
Glucose, Bld: 83 mg/dL (ref 70–99)
Potassium: 4.3 mEq/L (ref 3.5–5.1)
Sodium: 138 mEq/L (ref 135–145)
Total Bilirubin: 0.6 mg/dL (ref 0.2–1.2)
Total Protein: 7.2 g/dL (ref 6.0–8.3)

## 2021-09-16 LAB — LIPID PANEL
Cholesterol: 187 mg/dL (ref 0–200)
HDL: 72.5 mg/dL (ref >39.00)
NonHDL: 114.5
Total CHOL/HDL Ratio: 3
Triglycerides: 282 mg/dL — ABNORMAL HIGH (ref 0.0–149.0)
VLDL: 56.4 mg/dL — ABNORMAL HIGH (ref 0.0–40.0)

## 2021-09-16 LAB — LDL CHOLESTEROL, DIRECT: Direct LDL: 81 mg/dL

## 2021-09-16 LAB — IRON: Iron: 86 ug/dL (ref 42–145)

## 2021-09-16 NOTE — Progress Notes (Signed)
Patient presents to pick up her orthotics. They are noted to fit and contour her foot and arch nicely and fit well in her new balance running shoes. She will call if any problems arise.

## 2021-09-17 ENCOUNTER — Encounter: Payer: Self-pay | Admitting: Family Medicine

## 2021-09-20 ENCOUNTER — Ambulatory Visit
Admission: RE | Admit: 2021-09-20 | Discharge: 2021-09-20 | Disposition: A | Discharge status: Home / Self Care | Payer: BC Managed Care – PPO | Source: Ambulatory Visit | Attending: Obstetrics and Gynecology | Admitting: Obstetrics and Gynecology

## 2021-09-20 DIAGNOSIS — Z1231 Encounter for screening mammogram for malignant neoplasm of breast: Secondary | ICD-10-CM

## 2021-09-22 ENCOUNTER — Other Ambulatory Visit: Payer: Self-pay | Admitting: Obstetrics and Gynecology

## 2021-09-22 ENCOUNTER — Other Ambulatory Visit: Payer: Self-pay

## 2021-09-22 DIAGNOSIS — R928 Other abnormal and inconclusive findings on diagnostic imaging of breast: Secondary | ICD-10-CM

## 2021-09-22 DIAGNOSIS — C50411 Malignant neoplasm of upper-outer quadrant of right female breast: Secondary | ICD-10-CM

## 2021-09-22 NOTE — Progress Notes (Signed)
Pt called to report mass to L breast on most recent MM. Pt states DRI can not see her until 1/28 and she is very anxious. Orders faxed to [facility] and pt was scheduled for 11/10 at 1000. Pt knows to call with any concerns.

## 2021-09-23 ENCOUNTER — Encounter: Payer: Self-pay | Admitting: *Deleted

## 2021-09-23 NOTE — Progress Notes (Signed)
RN placed call to [name] requesting they [facility] pt recent mammogram images with [facility].  Medical records associate states Janice [name] has been complete for all scans from 2018-2022.

## 2021-09-29 ENCOUNTER — Encounter: Payer: Self-pay | Admitting: Oncology

## 2021-09-29 DIAGNOSIS — R928 Other abnormal and inconclusive findings on diagnostic imaging of breast: Secondary | ICD-10-CM | POA: Diagnosis not present

## 2021-09-29 DIAGNOSIS — R922 Inconclusive mammogram: Secondary | ICD-10-CM | POA: Diagnosis not present

## 2021-10-03 ENCOUNTER — Encounter: Payer: Self-pay | Admitting: Obstetrics and Gynecology

## 2021-10-03 ENCOUNTER — Ambulatory Visit (INDEPENDENT_AMBULATORY_CARE_PROVIDER_SITE_OTHER): Payer: BC Managed Care – PPO | Admitting: Obstetrics and Gynecology

## 2021-10-03 ENCOUNTER — Other Ambulatory Visit: Payer: Self-pay

## 2021-10-03 ENCOUNTER — Other Ambulatory Visit (HOSPITAL_COMMUNITY)
Admission: RE | Admit: 2021-10-03 | Discharge: 2021-10-03 | Disposition: A | Discharge status: Home / Self Care | Payer: BC Managed Care – PPO | Source: Ambulatory Visit | Attending: Obstetrics and Gynecology | Admitting: Obstetrics and Gynecology

## 2021-10-03 VITALS — BP 120/78 | HR 75 | Ht 66.5 in | Wt 158.0 lb

## 2021-10-03 DIAGNOSIS — Z01419 Encounter for gynecological examination (general) (routine) without abnormal findings: Secondary | ICD-10-CM | POA: Diagnosis not present

## 2021-10-03 DIAGNOSIS — M810 Age-related osteoporosis without current pathological fracture: Secondary | ICD-10-CM | POA: Diagnosis not present

## 2021-10-03 NOTE — Progress Notes (Signed)
54 y.o. G49P2002 Married Caucasian female here for annual exam.    Had mini abdominoplasty. Had aspiration pneumonia following this.   Taking Citracal and vit D.   PCP:  Merri Ray, MD  Patient's last menstrual period was 03/20/2006 (approximate).           Sexually active: Yes.    The current method of family planning is vasectomy/post menopausal status.    Exercising: Yes.     Walking and strength training Smoker:  no  Health Maintenance: Pap:  02/14/18 Neg:Neg HR HPV, 05-28-17 ASCUS:Neg HR HPV, 08-25-15 Neg:Neg HR HPV History of abnormal Pap:  yes, 05/28/17 ASCUS:Neg HR HPV MMG: 09-20-21 3D/Lt.Br poss.mass;Rt.Br.neg. Diag.Lt.and [place] rec.--Pt. States had at [facility] and was normal--will call for report.  [facility] 09/29/21 - dx left mammogram [id number].  Colonoscopy:   2021 normal   BMD:  07-04-21  Result :Osteoporosis TDaP:  08-12-14 Gardasil:   no HIV: 01-01-21 NR Hep C: donated blood in the past Screening Labs:  today.   reports that she has never smoked. She has never used smokeless tobacco. She reports that she does not currently use alcohol. She reports that she does not use drugs.  Past Medical History:  Diagnosis Date   Anemia    Arthritis    Atypical squamous cell changes of undetermined significance (ASCUS) on cervical cytology with negative high risk human papilloma virus (HPV) test result 2018   Breast cancer ([place])    right   Cancer ([place]) 02/22/2006   rt breast cancer   Crohn's disease ([place]) dx 2004  Crohn's ileitis   04-14-2003 resection terminal ileum, cecum, and appendix   History of breast cancer oncologist-  dr Jana Hakim---  no recurrence (lov 11-29-2016)   dx 04/ 2007 --  Right breast carcinoma , invasive DCIS, Stage IIB, Grade 2 (T1c N1) ER/PR positive, HER-2 negative--  03-12-2006  s/p  right lumpectomy w/ sln bx's and dissection --  adjuvant chemo and radiation therapy completed 01/ 2008-- completed tamoxifen 01/ 2018   History of cancer chemotherapy completed 01/  2008   right breast   History of genital warts    as teenager--  per pt frozen and did not return   Hypertriglyceridemia    Osteopenia 2018   Osteoporosis    Personal history of chemotherapy    Personal history of radiation therapy completed 01/ 2008   right breast   PMB (postmenopausal bleeding) 06/07/2017   Shingles     Past Surgical History:  Procedure Laterality Date   ABDOMINOPLASTY [name] 12/21/2020   BREAST LUMPECTOMY Right 2007   COLONOSCOPY     D & C HYSTEROSCOPIC RESECTION POLYP  11-29-2009   dr Joan Flores   HYSTEROSCOPY WITH D & C [name] 06/14/2017   Procedure: DILATATION AND CURETTAGE /HYSTEROSCOPY;  Surgeon: Nunzio Cobbs, MD;  Location: [facility];  Service: Gynecology;  Laterality: [name];   LAPAROSCOPIC CHOLECYSTECTOMY  09/16/2002   PORT-A-CATH REMOVAL  01/03/2007   RESECTION TERMINAL ILEUM, CECUM, AND APPENDIX  04-14-2003   dr Excell Seltzer   Crohn's ileitis   RIGHT BREAST LUMPECTOMY W/ MULTIPLE AXILLARY SENTINAL NODE BX'S AND DISSECTION  03/12/2006   TRANSTHORACIC ECHOCARDIOGRAM  04/05/2006   ef 55-60%/  trivial MR and TR    Current Outpatient Medications  Medication Sig Dispense Refill   BIOTIN PO Take 1 tablet by mouth daily.     Cholecalciferol (VITAMIN D-3) 125 MCG (5000 UT) TABS Take 1 tablet by mouth See admin instructions. Monday through Friday  Cyanocobalamin (VITAMIN B 12) 500 MCG TABS Take 1,000 mcg by mouth daily.     hyoscyamine (LEVSIN) 0.125 MG tablet Take 0.125 mg by mouth once as needed for cramping.     Krill Oil 500 MG CAPS Take 1 capsule by mouth daily.     Multiple Minerals-Vitamins (CITRACAL PLUS) TABS See admin instructions.     Multiple Vitamin (MULTIVITAMIN) tablet Take 1 tablet by mouth daily.     PREVALITE 4 g packet Take 4 g by mouth once as needed (cramping).     Probiotic Product (ALIGN) 4 MG CAPS Take 1 capsule by mouth daily.     No current facility-administered medications for this visit.    Family History   Problem Relation Age of Onset   Arthritis Mother    Dementia Mother    Kidney disease Father    Heart disease Father    Hearing loss Father    Heart attack Father    Pneumonia Father        covid 12   Dementia Father    Depression Sister    Cancer Sister    Hypertension Maternal Grandmother    Breast cancer Neg Hx     Review of Systems  All other systems reviewed and are negative.  Exam:   BP 120/78   Pulse 75   Ht 5' 6.5" (1.689 m)   Wt 158 lb (71.7 kg)   LMP 03/20/2006 (Approximate)   SpO2 98%   BMI 25.12 kg/m     General appearance: alert, cooperative and appears stated age Head: normocephalic, without obvious abnormality, atraumatic Neck: no adenopathy, supple, symmetrical, trachea midline and thyroid normal to inspection and palpation Lungs: clear to auscultation bilaterally Breasts: left - normal appearance, no masses or tenderness, No nipple retraction or dimpling, No nipple discharge or bleeding, No axillary adenopathy. Right - scar noted.  No masses or tenderness, No nipple retraction or dimpling, No nipple discharge or bleeding, No axillary adenopathy. Heart: regular rate and rhythm Abdomen: soft, non-tender; no masses, no organomegaly Extremities: extremities normal, atraumatic, no cyanosis or edema Skin: skin color, texture, turgor normal. No rashes or lesions Lymph nodes: cervical, supraclavicular, and axillary nodes normal. Neurologic: grossly normal  Pelvic: External genitalia:  no lesions              No abnormal inguinal nodes palpated.              Urethra:  normal appearing urethra with no masses, tenderness or lesions              Bartholins and Skenes: normal                 Vagina: normal appearing vagina with normal color and discharge, no lesions              Cervix: no lesions              Pap taken:  yes. Bimanual Exam:  Uterus:  normal size, contour, position, consistency, mobility, non-tender              Adnexa: no mass, fullness,  tenderness              Rectal exam: Yes.  .  Confirms.              Anus:  normal sphincter tone, no lesions  Chaperone was present for exam:  Estill Bamberg, CMA  Assessment:   Well woman visit with gynecologic exam. Hx ASCUS and negative HR HPV.  Osteoporosis.  Early menopause.  Hx right breast cancer.  Hx partial colectomy.  Hx shingles.  Plan: Mammogram screening discussed. Self breast awareness reviewed. Pap and HR HPV as above. Guidelines for Calcium, Vitamin D, regular exercise program including cardiovascular and weight bearing exercise. Follow up annually and prn.   We reviewed her bone density report and potential risk factors for osteoporosis.  Treatment options with bisphosphonates, Prolia, and Evista reviewed.  Reduction of risk of falls reviewed.  Check PTH, calcium, Ph, and vit D. Weight bearing exercise recommended.  Patient prefers to do non pharmaceutical treatment of the osteoporosis. Will check BMD again in 2 years.   After visit summary provided.

## 2021-10-03 NOTE — Patient Instructions (Signed)
EXERCISE AND DIET:  We recommended that you start or continue a regular exercise program for good health. Regular exercise means any activity that makes your heart beat faster and makes you sweat.  We recommend exercising at least 30 minutes per day at least 3 days a week, preferably 4 or 5.  We also recommend a diet low in fat and sugar.  Inactivity, poor dietary choices and obesity can cause diabetes, heart attack, stroke, and kidney damage, among others.    ALCOHOL AND SMOKING:  Women should limit their alcohol intake to no more than 7 drinks/beers/glasses of wine (combined, not each!) per week. Moderation of alcohol intake to this level decreases your risk of breast cancer and liver damage. And of course, no recreational drugs are part of a healthy lifestyle.  And absolutely no smoking or even second hand smoke. Most people know smoking can cause heart and lung diseases, but did you know it also contributes to weakening of your bones? Aging of your skin?  Yellowing of your teeth and nails?  CALCIUM AND VITAMIN D:  Adequate intake of calcium and Vitamin D are recommended.  The recommendations for exact amounts of these supplements seem to change often, but generally speaking 600 mg of calcium (either carbonate or citrate) and 800 units of Vitamin D per day seems prudent. Certain women may benefit from higher intake of Vitamin D.  If you are among these women, your doctor will have told you during your visit.    PAP SMEARS:  Pap smears, to check for cervical cancer or precancers,  have traditionally been done yearly, although recent scientific advances have shown that most women can have pap smears less often.  However, every woman still should have a physical exam from her gynecologist every year. It will include a breast check, inspection of the vulva and vagina to check for abnormal growths or skin changes, a visual exam of the cervix, and then an exam to evaluate the size and shape of the uterus and  ovaries.  And after 56 years of age, a rectal exam is indicated to check for rectal cancers. We will also provide age appropriate advice regarding health maintenance, like when you should have certain vaccines, screening for sexually transmitted diseases, bone density testing, colonoscopy, mammograms, etc.   MAMMOGRAMS:  All women over 61 years old should have a yearly mammogram. Many facilities now offer a "3D" mammogram, which may cost around $50 extra out of pocket. If possible,  we recommend you accept the option to have the 3D mammogram performed.  It both reduces the number of women who will be called back for extra views which then turn out to be normal, and it is better than the routine mammogram at detecting truly abnormal areas.    COLONOSCOPY:  Colonoscopy to screen for colon cancer is recommended for all women at age 106.  We know, you hate the idea of the prep.  We agree, BUT, having colon cancer and not knowing it is worse!!  Colon cancer so often starts as a polyp that can be seen and removed at colonscopy, which can quite literally save your life!  And if your first colonoscopy is normal and you have no family history of colon cancer, most women don't have to have it again for 10 years.  Once every ten years, you can do something that may end up saving your life, right?  We will be happy to help you get it scheduled when you are ready.  Be sure to check your insurance coverage so you understand how much it will cost.  It may be covered as a preventative service at no cost, but you should check your particular policy.    Osteoporosis Osteoporosis happens when the bones become thin and less dense than normal. Osteoporosis makes bones more brittle and fragile and more likely to break (fracture). Over time, osteoporosis can cause your bones to become so weak that they fracture after a minor fall. Bones in the hip, wrist, and spine are most likely to fracture due to osteoporosis. What are the  causes? The exact cause of this condition is not known. What increases the risk? You are more likely to develop this condition if you: Have family members with this condition. Have poor nutrition. Use the following: Steroid medicines, such as prednisone. Anti-seizure medicines. Nicotine or tobacco, such as cigarettes, e-cigarettes, and chewing tobacco. Are female. Are age 8 or older. Are not physically active (are sedentary). Are of European or Asian descent. Have a small body frame. What are the signs or symptoms? A fracture might be the first sign of osteoporosis, especially if the fracture results from a fall or injury that usually would not cause a bone to break. Other signs and symptoms include: Pain in the neck or low back. Stooped posture. Loss of height. How is this diagnosed? This condition may be diagnosed based on: Your medical history. A physical exam. A bone mineral density test, also called a DXA or DEXA test (dual-energy X-ray absorptiometry test). This test uses X-rays to measure the amount of minerals in your bones. How is this treated? This condition may be treated by: Making lifestyle changes, such as: Including foods with more calcium and vitamin D in your diet. Doing weight-bearing and muscle-strengthening exercises. Stopping tobacco use. Limiting alcohol intake. Taking medicine to slow the process of bone loss or to increase bone density. Taking daily supplements of calcium and vitamin D. Taking hormone replacement medicines, such as estrogen for women and testosterone for men. Monitoring your levels of calcium and vitamin D. The goal of treatment is to strengthen your bones and lower your risk for a fracture. Follow these instructions at home: Eating and drinking Include calcium and vitamin D in your diet. Calcium is important for bone health, and vitamin D helps your body absorb calcium. Good sources of calcium and vitamin D include: Certain fatty  fish, such as salmon and tuna. Products that have calcium and vitamin D added to them (are fortified), such as fortified cereals. Egg yolks. Cheese. Liver.  Activity Do exercises as told by your health care provider. Ask your health care provider what exercises and activities are safe for you. You should do: Exercises that make you work against gravity (weight-bearing exercises), such as tai chi, yoga, or walking. Exercises to strengthen muscles, such as lifting weights. Lifestyle Do not drink alcohol if: Your health care provider tells you not to drink. You are pregnant, may be pregnant, or are planning to become pregnant. If you drink alcohol: Limit how much you use to: 0-1 drink a day for women. 0-2 drinks a day for men. Know how much alcohol is in your drink. In the [place] one drink equals one 12 oz bottle of beer (355 mL), one 5 oz glass of wine (148 mL), or one 1 oz glass of hard liquor (44 mL). Do not use any products that contain nicotine or tobacco, such as cigarettes, e-cigarettes, and chewing tobacco. If you need help quitting, ask  your health care provider. Preventing falls Use devices to help you move around (mobility aids) as needed, such as canes, walkers, scooters, or crutches. Keep rooms well-lit and clutter-free. Remove tripping hazards from walkways, including cords and throw rugs. Install grab bars in bathrooms and safety rails on stairs. Use rubber mats in the bathroom and other areas that are often wet or slippery. Wear closed-toe shoes that fit well and support your feet. Wear shoes that have rubber soles or low heels. Review your medicines with your health care provider. Some medicines can cause dizziness or changes in blood pressure, which can increase your risk of falling. General instructions Take over-the-counter and prescription medicines only as told by your health care provider. Keep all follow-up visits. This is important. Contact a health care provider  if: You have never been screened for osteoporosis and you are: A woman who is age 51 or older. A man who is age 18 or older. Get help right away if: You fall or injure yourself. Summary Osteoporosis is thinning and loss of density in your bones. This makes bones more brittle and fragile and more likely to break (fracture),even with minor falls. The goal of treatment is to strengthen your bones and lower your risk for a fracture. Include calcium and vitamin D in your diet. Calcium is important for bone health, and vitamin D helps your body absorb calcium. Talk with your health care provider about screening for osteoporosis if you are a woman who is age 56 or older, or a man who is age 51 or older. This information is not intended to replace advice given to you by your health care provider. Make sure you discuss any questions you have with your health care provider. Document Revised: 04/22/2020 Document Reviewed: 04/22/2020 Elsevier Patient Education  [place].

## 2021-10-05 ENCOUNTER — Encounter: Payer: Self-pay | Admitting: Obstetrics and Gynecology

## 2021-10-05 LAB — PHOSPHORUS: Phosphorus: 3.9 mg/dL (ref 2.5–4.5)

## 2021-10-05 LAB — PTH, INTACT AND CALCIUM
Calcium: 9.9 mg/dL (ref 8.6–10.4)
PTH: 62 pg/mL (ref 16–77)

## 2021-10-05 LAB — VITAMIN D 25 HYDROXY (VIT D DEFICIENCY, FRACTURES): Vit D, 25-Hydroxy: 62 ng/mL (ref 30–100)

## 2021-10-06 LAB — CYTOLOGY - PAP: Diagnosis: NEGATIVE

## 2021-10-17 ENCOUNTER — Other Ambulatory Visit: Payer: BC Managed Care – PPO

## 2021-11-09 ENCOUNTER — Ambulatory Visit (INDEPENDENT_AMBULATORY_CARE_PROVIDER_SITE_OTHER): Payer: BC Managed Care – PPO | Admitting: Podiatry

## 2021-11-09 ENCOUNTER — Other Ambulatory Visit: Payer: Self-pay

## 2021-11-09 ENCOUNTER — Ambulatory Visit (INDEPENDENT_AMBULATORY_CARE_PROVIDER_SITE_OTHER): Payer: BC Managed Care – PPO

## 2021-11-09 ENCOUNTER — Encounter: Payer: Self-pay | Admitting: Podiatry

## 2021-11-09 DIAGNOSIS — S92503A Displaced unspecified fracture of unspecified lesser toe(s), initial encounter for closed fracture: Secondary | ICD-10-CM

## 2021-11-09 DIAGNOSIS — S99921A Unspecified injury of right foot, initial encounter: Secondary | ICD-10-CM

## 2021-11-09 DIAGNOSIS — S92502A Displaced unspecified fracture of left lesser toe(s), initial encounter for closed fracture: Secondary | ICD-10-CM | POA: Diagnosis not present

## 2021-11-09 NOTE — Progress Notes (Signed)
Subjective:  Patient ID: Janice [name], female    DOB: 1966/04/17,   MRN: [id number]  Chief Complaint  Patient presents with   Toe Injury    Left 5th toe injury. Pt rolled on foot and heard a pop with severe pain x 6 weeks ago.     55 y.o. female presents for concern of left fifth toe pain. Concerned she may have broke the toe. Relates 6 weeks ago she got off the floor and rolled her left foot and heard a pop now and had severe pain. Relates it has been improving but still has pain in certain shoes and wanted to have it checked out. .  Denies any other pedal complaints. Denies n/v/f/c.   Past Medical History:  Diagnosis Date   Anemia    Arthritis    Atypical squamous cell changes of undetermined significance (ASCUS) on cervical cytology with negative high risk human papilloma virus (HPV) test result 2018   Breast cancer ([facility])    right   Cancer ([place]) 02/22/2006   rt breast cancer   Crohn's disease ([facility]) dx 2004  Crohn's ileitis   04-14-2003 resection terminal ileum, cecum, and appendix   History of breast cancer oncologist-  dr Jana Hakim---  no recurrence (lov 11-29-2016)   dx 04/ 2007 --  Right breast carcinoma , invasive DCIS, Stage IIB, Grade 2 (T1c N1) ER/PR positive, HER-2 negative--  03-12-2006  s/p  right lumpectomy w/ sln bx's and dissection --  adjuvant chemo and radiation therapy completed 01/ 2008-- completed tamoxifen 01/ 2018   History of cancer chemotherapy completed 01/ 2008   right breast   History of genital warts    as teenager--  per pt frozen and did not return   Hypertriglyceridemia    Osteopenia 2018   Osteoporosis    Personal history of chemotherapy    Personal history of radiation therapy completed 01/ 2008   right breast   PMB (postmenopausal bleeding) 06/07/2017   Shingles     Objective:  Physical Exam: Vascular: DP/PT pulses 2/4 bilateral. CFT <3 seconds. Normal hair growth on digits. No edema.  Skin. No lacerations or abrasions bilateral feet.   Musculoskeletal: MMT 5/5 bilateral lower extremities in DF, PF, Inversion and Eversion. Deceased ROM in DF of ankle joint. Minimally tender to left fifth digit.  Neurological: Sensation intact to light touch.   Assessment:   1. Closed fracture of phalanx of fifth toe      Plan:  Patient was evaluated and treated and all questions answered. -Xrays reviewed. Radiographic lucency to the distal phalanx of the fifth digit. Non articular and non-displaced.   -Discussed treatement options for toe fracture; risks, alternatives, and benefits explained. -Discussed taping of toe.  -At this point surgical shoe not necessary  -Recommend protection, rest, ice, elevation daily until symptoms improve -Rx pain med/antinflammatories as needed -Patient to return to office as needed.    Lorenda Peck, DPM

## 2021-11-10 ENCOUNTER — Other Ambulatory Visit: Payer: Self-pay | Admitting: Podiatry

## 2021-11-10 DIAGNOSIS — S92503A Displaced unspecified fracture of unspecified lesser toe(s), initial encounter for closed fracture: Secondary | ICD-10-CM

## 2021-11-17 DIAGNOSIS — R42 Dizziness and giddiness: Secondary | ICD-10-CM | POA: Diagnosis not present

## 2021-11-17 DIAGNOSIS — H903 Sensorineural hearing loss, bilateral: Secondary | ICD-10-CM | POA: Diagnosis not present

## 2021-11-29 DIAGNOSIS — R42 Dizziness and giddiness: Secondary | ICD-10-CM | POA: Diagnosis not present

## 2021-12-06 DIAGNOSIS — H903 Sensorineural hearing loss, bilateral: Secondary | ICD-10-CM | POA: Diagnosis not present

## 2021-12-06 DIAGNOSIS — R42 Dizziness and giddiness: Secondary | ICD-10-CM | POA: Diagnosis not present

## 2021-12-06 DIAGNOSIS — H832X1 Labyrinthine dysfunction, right ear: Secondary | ICD-10-CM | POA: Diagnosis not present

## 2021-12-29 ENCOUNTER — Other Ambulatory Visit: Payer: Self-pay | Admitting: Obstetrics and Gynecology

## 2021-12-29 DIAGNOSIS — Z1231 Encounter for screening mammogram for malignant neoplasm of breast: Secondary | ICD-10-CM

## 2022-01-02 ENCOUNTER — Encounter: Payer: Self-pay | Admitting: Physical Therapy

## 2022-01-02 ENCOUNTER — Other Ambulatory Visit: Payer: Self-pay

## 2022-01-02 ENCOUNTER — Ambulatory Visit: Payer: BC Managed Care – PPO | Attending: Otolaryngology | Admitting: Physical Therapy

## 2022-01-02 DIAGNOSIS — R42 Dizziness and giddiness: Secondary | ICD-10-CM | POA: Diagnosis not present

## 2022-01-02 DIAGNOSIS — R2681 Unsteadiness on feet: Secondary | ICD-10-CM | POA: Diagnosis not present

## 2022-01-02 NOTE — Therapy (Addendum)
[facility] [address] [place], [place], [address] Phone: [phone]   Fax:  [phone]  Physical Therapy Evaluation  Patient Details  Name: Janice [name] MRN: [id number] Date of Birth: Nov 14, 1966 Referring Provider (PT): Dr. Benjamine Mola   Encounter Date: 01/02/2022   PT End of Session - 01/02/22 1532     Visit Number 1    Number of Visits 5    Date for PT Re-Evaluation 03/03/22    Authorization Type BCBS    PT Start Time 1440    PT Stop Time 1525    PT Time Calculation (min) 45 min    Activity Tolerance Patient tolerated treatment well    Behavior During Therapy [facility] for tasks assessed/performed             Past Medical History:  Diagnosis Date   Anemia    Arthritis    Atypical squamous cell changes of undetermined significance (ASCUS) on cervical cytology with negative high risk human papilloma virus (HPV) test result 2018   Breast cancer ([facility])    right   Cancer ([facility]) 02/22/2006   rt breast cancer   Crohn's disease ([facility]) dx 2004  Crohn's ileitis   04-14-2003 resection terminal ileum, cecum, and appendix   History of breast cancer oncologist-  dr Jana Hakim---  no recurrence (lov 11-29-2016)   dx 04/ 2007 --  Right breast carcinoma , invasive DCIS, Stage IIB, Grade 2 (T1c N1) ER/PR positive, HER-2 negative--  03-12-2006  s/p  right lumpectomy w/ sln bx's and dissection --  adjuvant chemo and radiation therapy completed 01/ 2008-- completed tamoxifen 01/ 2018   History of cancer chemotherapy completed 01/ 2008   right breast   History of genital warts    as teenager--  per pt frozen and did not return   Hypertriglyceridemia    Osteopenia 2018   Osteoporosis    Personal history of chemotherapy    Personal history of radiation therapy completed 01/ 2008   right breast   PMB (postmenopausal bleeding) 06/07/2017   Shingles     Past Surgical History:  Procedure Laterality Date   ABDOMINOPLASTY [name]  12/21/2020   BREAST LUMPECTOMY Right 2007   COLONOSCOPY     D & C HYSTEROSCOPIC RESECTION POLYP  11-29-2009   dr Joan Flores   HYSTEROSCOPY WITH D & C [name] 06/14/2017   Procedure: DILATATION AND CURETTAGE [place] [place];  Surgeon: Nunzio Cobbs, MD;  Location: [facility];  Service: Gynecology;  Laterality: [name];   LAPAROSCOPIC CHOLECYSTECTOMY  09/16/2002   PORT-A-CATH REMOVAL  01/03/2007   RESECTION TERMINAL ILEUM, CECUM, AND APPENDIX  04-14-2003   dr Excell Seltzer   Crohn's ileitis   RIGHT BREAST LUMPECTOMY W/ MULTIPLE AXILLARY SENTINAL NODE BX'S AND DISSECTION  03/12/2006   TRANSTHORACIC ECHOCARDIOGRAM  04/05/2006   ef 55-60%/  trivial MR and TR    There were no vitals filed for this visit.    Subjective Assessment - 01/02/22 1444     Subjective Saw Dr. Benjamine Mola recently. Has been having intermittent dizziness for the past 15 years (only 2 or 3 times a year). But in the past 6 months, noticed more episodes.  Reports it is more of a spinning sensation (this happened when she was under a lot of stress). Did calm down after taking Dramamine. Pt also reports feeling slightly off balance. Per Dr. Benjamine Mola, reports that dizziness is likely due to unilateal R peripheral vestibular weakness (but unknown of the cause). Had a  bout of pneumonia last year and some colds.    Pertinent History PMH: anemia, hx of R breast cancer (2007), osteoporosis.    Patient Stated Goals Wants to see if there is something she can do if she feels vertigo coming on.    Currently in Pain? No/denies                [facility] PT Assessment - 01/02/22 1449       Assessment   Medical Diagnosis Dizziness    Referring Provider (PT) Dr. Benjamine Mola    Onset Date/Surgical Date 12/16/21    Prior Therapy None for dizziness, just low back pain.      Balance Screen   Has the patient fallen in the past 6 months No    Has the patient had a decrease in activity level because of a fear of falling?  No    Is the patient  reluctant to leave their home because of a fear of falling?  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    Leisure Walking, cooking.      Observation/Other Assessments   Focus on Therapeutic Outcomes (FOTO)  DFS: 67, DPS: 58.9      Ambulation/Gait   Ambulation/Gait Yes    Ambulation/Gait Assistance 7: Independent      High Level Balance   High Level Balance Comments mCTSIB: conditions 1-4 = 30 seconds, incr postural sway with condition 4.      Functional Gait  Assessment   Gait assessed  Yes    Gait Level Surface Walks 20 ft in less than 5.5 sec, no assistive devices, good speed, no evidence for imbalance, normal gait pattern, deviates no more than 6 in outside of the 12 in walkway width.    Change in Gait Speed Able to smoothly change walking speed without loss of balance or gait deviation. Deviate no more than 6 in outside of the 12 in walkway width.    Gait with Horizontal Head Turns Performs head turns smoothly with no change in gait. Deviates no more than 6 in outside 12 in walkway width    Gait with Vertical Head Turns Performs head turns with no change in gait. Deviates no more than 6 in outside 12 in walkway width.    Gait and Pivot Turn Pivot turns safely within 3 sec and stops quickly with no loss of balance.    Step Over Obstacle Is able to step over 2 stacked shoe boxes taped together (9 in total height) without changing gait speed. No evidence of imbalance.    Gait with Narrow Base of Support Is able to ambulate for 10 steps heel to toe with no staggering.    Gait with Eyes Closed Walks 20 ft, uses assistive device, slower speed, mild gait deviations, deviates 6-10 in outside 12 in walkway width. Ambulates 20 ft in less than 9 sec but greater than 7 sec.   7.94 seconds   Ambulating Backwards Walks 20 ft, no assistive devices, good speed, no evidence for imbalance, normal gait    Steps Alternating  feet, no rail.    Total Score 29                    Vestibular Assessment - 01/02/22 1451       Vestibular Assessment   General Observation Ambulates into session independently with no AD.      Symptom  Behavior   Subjective history of current problem Has been going on for about 15 years, but has been happening more frequently (in the past 6 months). Reports a little bit of dizziness also when her head is down (very mild when she is stretching with her eyes closed)    Type of Dizziness  Spinning   feels like the world in spinning   Frequency of Dizziness Was 2 or 3 times a week, but is has recently eased off.    Duration of Dizziness "better part of a day" ,was better by the next day    Symptom Nature --   Randomly, tendency to happen in the morning.   Aggravating Factors Mornings;Turning head quickly;Turning body quickly    Relieving Factors Comments;Medication;Head stationary   Sitting still, dramine   Progression of Symptoms Better    History of similar episodes Has hx of these episodes.      Oculomotor Exam   Oculomotor Alignment Normal    Ocular ROM WFL    Spontaneous Absent    Gaze-induced  Absent    Saccades Intact      Oculomotor Exam-Fixation Suppressed    Left Head Impulse Normal    Right Head Impulse Normal      Vestibulo-Ocular Reflex   VOR to Slow Head Movement Normal    VOR Cancellation Normal      Visual Acuity   Static line 10    Dynamic line 8      Positional Testing   Dix-Hallpike Dix-Hallpike Left;Dix-Hallpike Right    Horizontal Canal Testing Horizontal Canal Right;Horizontal Canal Left      Dix-Hallpike Right   Dix-Hallpike Right Symptoms No nystagmus      Dix-Hallpike Left   Dix-Hallpike Left Symptoms No nystagmus      Horizontal Canal Right   Horizontal Canal Right Symptoms Normal      Horizontal Canal Left   Horizontal Canal Left Symptoms Normal      Positional Sensitivities   Sit to Supine No dizziness    Supine to Sitting  No dizziness    Right Hallpike No dizziness    Up from Right Hallpike No dizziness    Up from Left Hallpike No dizziness    Nose to Right Knee No dizziness    Right Knee to Sitting No dizziness    Nose to Left Knee No dizziness    Left Knee to Sitting No dizziness    Head Turning x 5 No dizziness    Head Nodding x 5 Lightheadedness   "very very slight"   Pivot Right in Standing No dizziness    Pivot Left in Standing No dizziness    Rolling Right No dizziness    Rolling Left No dizziness    Positional Sensitivities Comments bending over, head motions in standing                Objective measurements completed on examination: See above findings.                  PT Short Term Goals - 01/02/22 1532       PT SHORT TERM GOAL #1   Title ALL STGS = LTGS               PT Long Term Goals - 01/02/22 1814       PT LONG TERM GOAL #1   Title Pt will be independent with final HEP in order to build upon gains made in therapy. ALL LTGS DUE 01/30/22  Time 4    Period Weeks    Status New    Target Date 01/30/22      PT LONG TERM GOAL #2   Title Pt will undergo further testing with SOT and LTG written.    Time 4    Period Weeks    Status New      PT LONG TERM GOAL #3   Title Pt will improve DPS score to at least a 65 in order to demo improved functional outcomes.    Baseline 58.9    Time 4    Period Weeks    Status New      PT LONG TERM GOAL #4   Title Pt will perform DVA with a 1 line or less difference in order to demo improved VOR.    Baseline 2 line difference, static line 10, dynamic line 8    Time 4    Period Weeks    Status New                    Plan - 01/02/22 1816     Clinical Impression Statement Patient is a 56 year old female referred to Neuro OPPT for dizziness.   Pt's PMH is significant for: anemia, hx of R breast cancer (2007), osteoporosis.  Pt recently saw Dr. Benjamine Mola for vestibular episodes and he stated that dizziness is  likely due to unilateal R peripheral vestibular weakness. The following deficits were present during the exam: impaired VOR as noted by DVA, impaired balance with eyes closed and condition 4 of mCTSIB. Pt would benefit from further assessment of balance systems using SOT at next session. Pt would benefit from skilled PT to address these impairments and functional limitations to maximize functional mobility independence    Personal Factors and Comorbidities Comorbidity 3+;Time since onset of injury/illness/exacerbation;Past/Current Experience    Comorbidities PMH: anemia, hx of R breast cancer (2007), osteoporosis.    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 Initial HEP for VOR x1 and balance on foam with vision removed/head motions.    Consulted and Agree with Plan of Care Patient             Patient will benefit from skilled therapeutic intervention in order to improve the following deficits and impairments:  Decreased balance, Dizziness  Visit Diagnosis: Dizziness and giddiness  Unsteadiness on feet     Problem List Patient Active Problem List   Diagnosis Date Noted   Pneumonia 01/01/2021   CAP (community acquired pneumonia) 01/01/2021   Encounter for counseling 10/19/2020   Malignant neoplasm of upper-outer quadrant of right breast in female, estrogen receptor positive ([facility]) 09/05/2017   Breast cancer, right breast ([place]) 03/08/2015   Dysuria 03/04/2014    Arliss Journey, PT,  DPT  01/03/2022, 1:02 PM  [facility] [address] [place], [place], [address] Phone: [phone]   Fax:  [phone]  Name: Janice [name] MRN: [id number] Date of Birth: 02/10/1966

## 2022-01-03 NOTE — Addendum Note (Signed)
Addended by: Arliss Journey on: 01/03/2022 01:03 PM   Modules accepted: Orders

## 2022-01-09 ENCOUNTER — Ambulatory Visit: Payer: BC Managed Care – PPO | Admitting: Physical Therapy

## 2022-01-30 ENCOUNTER — Encounter: Payer: BC Managed Care – PPO | Admitting: Physical Therapy

## 2022-02-27 ENCOUNTER — Encounter: Payer: BC Managed Care – PPO | Admitting: Family Medicine

## 2022-03-09 ENCOUNTER — Ambulatory Visit (INDEPENDENT_AMBULATORY_CARE_PROVIDER_SITE_OTHER): Payer: BC Managed Care – PPO | Admitting: Family Medicine

## 2022-03-09 ENCOUNTER — Encounter: Payer: Self-pay | Admitting: Family Medicine

## 2022-03-09 VITALS — BP 118/66 | HR 81 | Temp 98.0°F | Resp 17 | Ht 66.5 in | Wt 158.0 lb

## 2022-03-09 DIAGNOSIS — Z13220 Encounter for screening for lipoid disorders: Secondary | ICD-10-CM

## 2022-03-09 DIAGNOSIS — D509 Iron deficiency anemia, unspecified: Secondary | ICD-10-CM

## 2022-03-09 DIAGNOSIS — Z1159 Encounter for screening for other viral diseases: Secondary | ICD-10-CM | POA: Diagnosis not present

## 2022-03-09 DIAGNOSIS — Z131 Encounter for screening for diabetes mellitus: Secondary | ICD-10-CM

## 2022-03-09 DIAGNOSIS — Z0000 Encounter for general adult medical examination without abnormal findings: Secondary | ICD-10-CM | POA: Diagnosis not present

## 2022-03-09 LAB — CBC WITH DIFFERENTIAL/PLATELET
Basophils Absolute: 0 10*3/uL (ref 0.0–0.1)
Basophils Relative: 0.5 % (ref 0.0–3.0)
Eosinophils Absolute: 0.1 10*3/uL (ref 0.0–0.7)
Eosinophils Relative: 1.6 % (ref 0.0–5.0)
HCT: 39.9 % (ref 36.0–46.0)
Hemoglobin: 13.9 g/dL (ref 12.0–15.0)
Lymphocytes Relative: 24.6 % (ref 12.0–46.0)
Lymphs Abs: 1.2 10*3/uL (ref 0.7–4.0)
MCHC: 34.9 g/dL (ref 30.0–36.0)
MCV: 92.8 fl (ref 78.0–100.0)
Monocytes Absolute: 0.3 10*3/uL (ref 0.1–1.0)
Monocytes Relative: 5.2 % (ref 3.0–12.0)
Neutro Abs: 3.3 10*3/uL (ref 1.4–7.7)
Neutrophils Relative %: 68.1 % (ref 43.0–77.0)
Platelets: 166 10*3/uL (ref 150.0–400.0)
RBC: 4.3 Mil/uL (ref 3.87–5.11)
RDW: 12.9 % (ref 11.5–15.5)
WBC: 4.8 10*3/uL (ref 4.0–10.5)

## 2022-03-09 LAB — COMPREHENSIVE METABOLIC PANEL
ALT: 14 U/L (ref 0–35)
AST: 18 U/L (ref 0–37)
Albumin: 4.5 g/dL (ref 3.5–5.2)
Alkaline Phosphatase: 68 U/L (ref 39–117)
BUN: 12 mg/dL (ref 6–23)
CO2: 29 mEq/L (ref 19–32)
Calcium: 9.5 mg/dL (ref 8.4–10.5)
Chloride: 101 mEq/L (ref 96–112)
Creatinine, Ser: 0.68 mg/dL (ref 0.40–1.20)
GFR: 97 mL/min (ref >60.00)
Glucose, Bld: 84 mg/dL (ref 70–99)
Potassium: 3.9 mEq/L (ref 3.5–5.1)
Sodium: 137 mEq/L (ref 135–145)
Total Bilirubin: 1.3 mg/dL — ABNORMAL HIGH (ref 0.2–1.2)
Total Protein: 6.8 g/dL (ref 6.0–8.3)

## 2022-03-09 LAB — LIPID PANEL
Cholesterol: 171 mg/dL (ref 0–200)
HDL: 72.3 mg/dL (ref >39.00)
LDL Cholesterol: 74 mg/dL (ref 0–99)
NonHDL: 98.39
Total CHOL/HDL Ratio: 2
Triglycerides: 124 mg/dL (ref 0.0–149.0)
VLDL: 24.8 mg/dL (ref 0.0–40.0)

## 2022-03-09 LAB — HEMOGLOBIN A1C: Hgb A1c MFr Bld: 5.3 % (ref 4.6–6.5)

## 2022-03-09 NOTE — Progress Notes (Signed)
? ?Subjective:  ?Patient ID: Janice [name], female    DOB: 06-13-1966  Age: 56 y.o. MRN: [id number] ? ?CC:  ?Chief Complaint  ?Patient presents with  ? Annual Exam  ?  Pt has been well, pt is fasting for labs, will request eye exam from Dr Loura Back   ? ? ?HPI ?Janice [name] presents for Annual Exam ? ?Gynecology, Dr. Quincy Simmonds, appointment in November. ? ?Osteoporosis ?His testing last year indicated osteoporosis of hip and spine.  Progressed from osteopenia previously.  She had a weight training few times per week and calcium supplement.  Medications were discussed with gynecology November.  Pharmacological treatment of osteoporosis with repeat bone density planned in 2 years. ?On calcium and vit D.  ? ?History of breast cancer  ?2007 status post lobectomy, radiation, chemo, tamoxifen.mammogram 09/2021.  ? ?Crohn's disease ?Hyoscyamine as needed, Prevalite as needed. ?Daily Align. Managed well.  ?Prior GI: Dr. Cristina Gong.  ? ?Previous anemia, improved with supplementation, triglyceridemia in the past, improved with diet/exercise. ?Lab Results  ?Component Value Date  ? WBC 5.5 09/15/2021  ? HGB 14.0 09/15/2021  ? HCT 40.9 09/15/2021  ? MCV 92.7 09/15/2021  ? PLT 204.0 09/15/2021  ? ? ?Lab Results  ?Component Value Date  ? CHOL 187 09/15/2021  ? HDL 72.50 09/15/2021  ? [place] 82 09/29/2020  ? LDLDIRECT 81.0 09/15/2021  ? TRIG 282.0 (H) 09/15/2021  ? CHOLHDL 3 09/15/2021  ?The 10-year ASCVD risk score (Arnett DK, et al., 2019) is: 1.4% ?  Values used to calculate the score: ?    Age: 71 years ?    Sex: Female ?    Is Non-Hispanic African American: No ?    Diabetic: No ?    Tobacco smoker: No ?    Systolic Blood Pressure: 315 mmHg ?    Is BP treated: No ?    HDL Cholesterol: 72.5 mg/dL ?    Total Cholesterol: 187 mg/dL ? ? ? ?  03/09/2022  ?  8:09 AM  ?Depression screen PHQ 2/9  ?Decreased Interest 0  ?Down, Depressed, Hopeless 0  ?PHQ - 2 Score 0  ? ? ?Health Maintenance  ?Topic Date Due  ? Hepatitis C Screening   Never done  ? COVID-19 Vaccine (1) 03/25/2022 (Originally 10/01/1966)  ? Zoster Vaccines- Shingrix (1 of 2) 06/08/2022 (Originally 03/31/1984)  ? COLONOSCOPY (Pts 45-84yr Insurance coverage will need to be confirmed)  09/15/2022 (Originally 01/15/2020)  ? INFLUENZA VACCINE  06/20/2022  ? MAMMOGRAM  10/06/2023  ? TETANUS/TDAP  08/12/2024  ? PAP SMEAR-Modifier  10/03/2024  ? HIV Screening  Completed  ? HPV VACCINES  Aged Out  ?Colonoscopy: Followed by Dr. BCristina Gongpreviously. ?Dermatology: Dr. SFontaine No No skin cancer.   ? ?Immunization History  ?Administered Date(s) Administered  ? Tdap 08/12/2014  ?Flu vaccine, pneumonia vaccine, COVID-vaccine declined.  History of pneumonia in February 2022 post abdominoplasty, Shingles vaccine has been discussed previously, shingles disease about 10 years ago, declines vaccine at this time. ? ?No results found. ?Dr. LPrudencio Burly Regular visits with optho.  ? ?Dental:Yes ? ?Alcohol: less than once every 6 months.  ? ?Tobacco: none. ? ?Exercise: walking 6-7 days per week, 45 min, weight based exercise few days per week.  ? ?History ?Patient Active Problem List  ? Diagnosis Date Noted  ? Pneumonia 01/01/2021  ? CAP (community acquired pneumonia) 01/01/2021  ? Encounter for counseling 10/19/2020  ? Malignant neoplasm of upper-outer quadrant of right breast in female, estrogen receptor positive ([facility] 09/05/2017  ?  Breast cancer, right breast ([place]) 03/08/2015  ? Dysuria 03/04/2014  ? ?Past Medical History:  ?Diagnosis Date  ? Anemia   ? Arthritis   ? Atypical squamous cell changes of undetermined significance (ASCUS) on cervical cytology with negative high risk human papilloma virus (HPV) test result 2018  ? Breast cancer ([place])   ? right  ? Cancer ([place]) 02/22/2006  ? rt breast cancer  ? Crohn's disease [facility]) dx 2004  Crohn's ileitis  ? 04-14-2003 resection terminal ileum, cecum, and appendix  ? History of breast cancer oncologist-  dr Jana Hakim---  no recurrence ([place] 11-29-2016)  ? dx 04/  2007 --  Right breast carcinoma , invasive DCIS, Stage IIB, Grade 2 (T1c N1) ER/PR positive, HER-2 negative--  03-12-2006  s/p  right lumpectomy w/ sln bx's and dissection --  adjuvant chemo and radiation therapy completed 01/ 2008-- completed tamoxifen 01/ 2018  ? History of cancer chemotherapy completed 01/ 2008  ? right breast  ? History of genital warts   ? as teenager--  per pt frozen and did not return  ? Hypertriglyceridemia   ? Osteopenia 2018  ? Osteoporosis   ? Personal history of chemotherapy   ? Personal history of radiation therapy completed 01/ 2008  ? right breast  ? PMB (postmenopausal bleeding) 06/07/2017  ? Shingles   ? ?Past Surgical History:  ?Procedure Laterality Date  ? ABDOMINOPLASTY [name] 12/21/2020  ? BREAST LUMPECTOMY Right 2007  ? COLONOSCOPY    ? D & C HYSTEROSCOPIC RESECTION POLYP  11-29-2009   dr Joan Flores  ? HYSTEROSCOPY WITH D & C [name] 06/14/2017  ? Procedure: DILATATION AND CURETTAGE /HYSTEROSCOPY;  Surgeon: Nunzio Cobbs, MD;  Location: [facility];  Service: Gynecology;  Laterality: [name];  ? LAPAROSCOPIC CHOLECYSTECTOMY  09/16/2002  ? PORT-A-CATH REMOVAL  01/03/2007  ? RESECTION TERMINAL ILEUM, CECUM, AND APPENDIX  04-14-2003   dr Excell Seltzer  ? Crohn's ileitis  ? RIGHT BREAST LUMPECTOMY W/ MULTIPLE AXILLARY SENTINAL NODE BX'S AND DISSECTION  03/12/2006  ? TRANSTHORACIC ECHOCARDIOGRAM  04/05/2006  ? ef 55-60%/  trivial MR and TR  ? ?Allergies  ?Allergen Reactions  ? Oxycodone-Acetaminophen Other (See Comments)  ? ?Prior to Admission medications   ?Medication Sig Start Date End Date Taking? Authorizing Provider  ?BIOTIN PO Take 1 tablet by mouth daily.   Yes [provider]  ?Cholecalciferol (VITAMIN D-3) 125 MCG (5000 UT) TABS Take 1 tablet by mouth See admin instructions. Monday through Friday   Yes [provider]  ?Cyanocobalamin (VITAMIN B 12) 500 MCG TABS Take 1,000 mcg by mouth daily. 07/22/19  Yes [provider]  ?hyoscyamine  (LEVSIN) 0.125 MG tablet Take 0.125 mg by mouth once as needed for cramping.   Yes [provider]  ?Astrid Drafts 500 MG CAPS Take 1 capsule by mouth daily.   Yes [provider]  ?Multiple Minerals-Vitamins (CITRACAL PLUS) TABS See admin instructions.   Yes [provider]  ?Multiple Vitamin (MULTIVITAMIN) tablet Take 1 tablet by mouth daily.   Yes [provider]  ?PREVALITE 4 g packet Take 4 g by mouth once as needed (cramping). 10/22/20  Yes [provider]  ?Probiotic Product (ALIGN) 4 MG CAPS Take 1 capsule by mouth daily.   Yes [provider]  ? ?Social History  ? ?Socioeconomic History  ? Marital status: Married  ?  Spouse name: Not on file  ? Number of children: Not on file  ? Years of education: Not  on file  ? Highest education level: Not on file  ?Occupational History  ? Not on file  ?Tobacco Use  ? Smoking status: Never  ? Smokeless tobacco: Never  ?Vaping Use  ? Vaping Use: Never used  ?Substance and Sexual Activity  ? Alcohol use: Not Currently  ? Drug use: Never  ? Sexual activity: Yes  ?  Partners: Male  ?  Birth control/protection: Post-menopausal  ?  Comment: husband with vasectomy  ?Other Topics Concern  ? Not on file  ?Social History Narrative  ? Not on file  ? ?Social Determinants of Health  ? ?Financial Resource Strain: Not on file  ?Food Insecurity: Not on file  ?Transportation Needs: Not on file  ?Physical Activity: Not on file  ?Stress: Not on file  ?Social Connections: Not on file  ?Intimate Partner Violence: Not on file  ? ? ?Review of Systems ?13 point review of systems per patient health survey noted.  Negative other than as indicated above or in HPI.  ? ? ?Objective:  ? ?Vitals:  ? 03/09/22 0805  ?BP: 118/66  ?Pulse: 81  ?Resp: 17  ?Temp: 98 ?F (36.7 ?C)  ?TempSrc: Temporal  ?SpO2: 99%  ?Weight: 158 lb (71.7 kg)  ?Height: 5' 6.5" (1.689 m)  ? ? ? ?Physical Exam ?Constitutional:   ?   Appearance: She is well-developed.  ?HENT:  ?   Head:  Normocephalic and atraumatic.  ?   Right Ear: External ear normal.  ?   Left Ear: External ear normal.  ?Eyes:  ?   Conjunctiva/sclera: Conjunctivae normal.  ?   Pupils: Pupils are equal, round, and reactive to

## 2022-03-09 NOTE — Patient Instructions (Addendum)
Keep up with exercise. If crohn's starts to flare, I do recommend establishing with new gastroenterologist. Let me know.  ?No concerns today. Thanks for coming in today.  ? ?Preventive Care 26-57 Years Old, Female ?Preventive care refers to lifestyle choices and visits with your health care provider that can promote health and wellness. Preventive care visits are also called wellness exams. ?What can I expect for my preventive care visit? ?Counseling ?Your health care provider may ask you questions about your: ?Medical history, including: ?Past medical problems. ?Family medical history. ?Pregnancy history. ?Current health, including: ?Menstrual cycle. ?Method of birth control. ?Emotional well-being. ?Home life and relationship well-being. ?Sexual activity and sexual health. ?Lifestyle, including: ?Alcohol, nicotine or tobacco, and drug use. ?Access to firearms. ?Diet, exercise, and sleep habits. ?Work and work Statistician. ?Sunscreen use. ?Safety issues such as seatbelt and bike helmet use. ?Physical exam ?Your health care provider will check your: ?Height and weight. These may be used to calculate your BMI (body mass index). BMI is a measurement that tells if you are at a healthy weight. ?Waist circumference. This measures the distance around your waistline. This measurement also tells if you are at a healthy weight and may help predict your risk of certain diseases, such as type 2 diabetes and high blood pressure. ?Heart rate and blood pressure. ?Body temperature. ?Skin for abnormal spots. ?What immunizations do I need? ? ?Vaccines are usually given at various ages, according to a schedule. Your health care provider will recommend vaccines for you based on your age, medical history, and lifestyle or other factors, such as travel or where you work. ?What tests do I need? ?Screening ?Your health care provider may recommend screening tests for certain conditions. This may include: ?Lipid and cholesterol  levels. ?Diabetes screening. This is done by checking your blood sugar (glucose) after you have not eaten for a while (fasting). ?Pelvic exam and Pap test. ?Hepatitis B test. ?Hepatitis C test. ?HIV (human immunodeficiency virus) test. ?STI (sexually transmitted infection) testing, if you are at risk. ?Lung cancer screening. ?Colorectal cancer screening. ?Mammogram. Talk with your health care provider about when you should start having regular mammograms. This may depend on whether you have a family history of breast cancer. ?BRCA-related cancer screening. This may be done if you have a family history of breast, ovarian, tubal, or peritoneal cancers. ?Bone density scan. This is done to screen for osteoporosis. ?Talk with your health care provider about your test results, treatment options, and if necessary, the need for more tests. ?Follow these instructions at home: ?Eating and drinking ? ?Eat a diet that includes fresh fruits and vegetables, whole grains, lean protein, and low-fat dairy products. ?Take vitamin and mineral supplements as recommended by your health care provider. ?Do not drink alcohol if: ?Your health care provider tells you not to drink. ?You are pregnant, may be pregnant, or are planning to become pregnant. ?If you drink alcohol: ?Limit how much you have to 0-1 drink a day. ?Know how much alcohol is in your drink. In the [place] one drink equals one 12 oz bottle of beer (355 mL), one 5 oz glass of wine (148 mL), or one 1? oz glass of hard liquor (44 mL). ?Lifestyle ?Brush your teeth every morning and night with fluoride toothpaste. Floss one time each day. ?Exercise for at least 30 minutes 5 or more days each week. ?Do not use any products that contain nicotine or tobacco. These products include cigarettes, chewing tobacco, and vaping devices, such as e-cigarettes. If  you need help quitting, ask your health care provider. ?Do not use drugs. ?If you are sexually active, practice safe sex. Use a  condom or other form of protection to prevent STIs. ?If you do not wish to become pregnant, use a form of birth control. If you plan to become pregnant, see your health care provider for a prepregnancy visit. ?Take aspirin only as told by your health care provider. Make sure that you understand how much to take and what form to take. Work with your health care provider to find out whether it is safe and beneficial for you to take aspirin daily. ?Find healthy ways to manage stress, such as: ?Meditation, yoga, or listening to music. ?Journaling. ?Talking to a trusted person. ?Spending time with friends and family. ?Minimize exposure to UV radiation to reduce your risk of skin cancer. ?Safety ?Always wear your seat belt while driving or riding in a vehicle. ?Do not drive: ?If you have been drinking alcohol. Do not ride with someone who has been drinking. ?When you are tired or distracted. ?While texting. ?If you have been using any mind-altering substances or drugs. ?Wear a helmet and other protective equipment during sports activities. ?If you have firearms in your house, make sure you follow all gun safety procedures. ?Seek help if you have been physically or sexually abused. ?What's next? ?Visit your health care provider once a year for an annual wellness visit. ?Ask your health care provider how often you should have your eyes and teeth checked. ?Stay up to date on all vaccines. ?This information is not intended to replace advice given to you by your health care provider. Make sure you discuss any questions you have with your health care provider. ?Document Revised: 05/04/2021 Document Reviewed: 05/04/2021 ?Elsevier Patient Education ? [place]. ? ? ?High Triglycerides Eating Plan ?Triglycerides are a type of fat in the blood. High levels of triglycerides can increase your risk of heart disease and stroke. If your triglyceride levels are high, choosing the right foods can help lower your triglycerides and  keep your heart healthy. Work with your health care provider or a dietitian to develop an eating plan that is right for you. ?What are tips for following this plan? ?General guidelines ? ?Lose weight, if you are overweight. For most people, losing 5-10 lb (2-5 kg) helps lower triglyceride levels. A weight-loss plan may include: ?30 minutes of exercise at least 5 days a week. ?Reducing the amount of calories, sugar, and fat you eat. ?Eat a wide variety of fresh fruits, vegetables, and whole grains. These foods are high in fiber. ?Eat foods that contain healthy fats, such as fatty fish, nuts, seeds, and olive oil. ?Avoid foods that are high in added sugar, added salt (sodium), and saturated fat. ?Avoid low-fiber, refined carbohydrates such as white bread, crackers, noodles, and white rice. ?Avoid foods with trans fats or partially hydrogenated oils, such as fried foods or stick margarine. ?If you drink alcohol: ?Limit how much you have to: ?0-1 drink a day for women who are not pregnant. ?0-2 drinks a day for men. ?Your health care provider may recommend that you drink less than these amounts depending on your overall health. ?Know how much alcohol is in a drink. In the [place] one drink equals one 12 oz bottle of beer (355 mL), one 5 oz glass of wine (148 mL), or one 1? oz glass of hard liquor (44 mL). ?Reading food labels ?Check food labels for: ?The amount of saturated fat.  Choose foods with no or very little saturated fat (less than 2 g). ?The amount of trans fat. Choose foods with no transfat. ?The amount of cholesterol. Choose foods that are low in cholesterol. ?The amount of sodium. Choose foods with less than 140 milligrams (mg) per serving. ?Shopping ?Buy dairy products labeled as nonfat (skim) or low-fat (1%). ?Avoid buying processed or prepackaged foods. These are often high in added sugar, sodium, and fat. ?Cooking ?Choose healthy fats when cooking, such as olive oil, avocado oil, or canola oil. ?Cook foods  using lower fat methods, such as baking, broiling, boiling, or grilling. ?Make your own sauces, dressings, and marinades when possible, instead of buying them. Store-bought sauces, dressings, and marinades are often hi

## 2022-03-10 LAB — HEPATITIS C ANTIBODY
Hepatitis C Ab: NONREACTIVE
SIGNAL TO CUT-OFF: 0.1 (ref <1.00)

## 2022-03-22 ENCOUNTER — Encounter: Payer: Self-pay | Admitting: Family Medicine

## 2022-04-26 ENCOUNTER — Encounter: Payer: Self-pay | Admitting: Podiatry

## 2022-04-26 ENCOUNTER — Ambulatory Visit (INDEPENDENT_AMBULATORY_CARE_PROVIDER_SITE_OTHER): Payer: BC Managed Care – PPO | Admitting: Podiatry

## 2022-04-26 ENCOUNTER — Ambulatory Visit (INDEPENDENT_AMBULATORY_CARE_PROVIDER_SITE_OTHER): Payer: BC Managed Care – PPO

## 2022-04-26 DIAGNOSIS — M21612 Bunion of left foot: Secondary | ICD-10-CM

## 2022-04-26 DIAGNOSIS — M21611 Bunion of right foot: Secondary | ICD-10-CM

## 2022-04-26 NOTE — Patient Instructions (Signed)

## 2022-04-26 NOTE — Progress Notes (Signed)
Subjective:   Patient ID: Janice [name], female   DOB: 56 y.o.   MRN: [id number]   HPI Patient presents stating the bunion on her right foot has really started to become more sore and it is increasingly hard for her to wear shoe gear comfortably.  States that it is red and irritated and at times she is getting shooting pains especially at night.  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  She like to get this done but needs to hold off currently and at this point wider shoes was recommended and patient will also soak and will call when she desires a distal type osteotomy for this and I will see her back prior to go over the surgical surgery itself which I went over today educated her on recovery for distal osteotomy  X-rays indicate elevation of the 1 2 intermetatarsal angle with enlargement of the head of the bone right

## 2022-05-16 DIAGNOSIS — L84 Corns and callosities: Secondary | ICD-10-CM | POA: Diagnosis not present

## 2022-05-16 DIAGNOSIS — L570 Actinic keratosis: Secondary | ICD-10-CM | POA: Diagnosis not present

## 2022-05-16 DIAGNOSIS — R21 Rash and other nonspecific skin eruption: Secondary | ICD-10-CM | POA: Diagnosis not present

## 2022-09-18 ENCOUNTER — Encounter: Payer: Self-pay | Admitting: Obstetrics and Gynecology

## 2022-09-19 NOTE — Telephone Encounter (Signed)
Please contact the [facility] to see if the radiologist will read the patient's mammogram the same day as her appointment.   She is a breast cancer survivor.   Please let me and the patient know their response to this request.

## 2022-09-21 ENCOUNTER — Ambulatory Visit
Admission: RE | Admit: 2022-09-21 | Discharge: 2022-09-21 | Disposition: A | Discharge status: Home / Self Care | Payer: BC Managed Care – PPO | Source: Ambulatory Visit | Attending: Obstetrics and Gynecology | Admitting: Obstetrics and Gynecology

## 2022-09-21 DIAGNOSIS — Z1231 Encounter for screening mammogram for malignant neoplasm of breast: Secondary | ICD-10-CM | POA: Diagnosis not present

## 2022-10-04 ENCOUNTER — Encounter: Payer: Self-pay | Admitting: Obstetrics and Gynecology

## 2022-10-04 ENCOUNTER — Ambulatory Visit (INDEPENDENT_AMBULATORY_CARE_PROVIDER_SITE_OTHER): Payer: BC Managed Care – PPO | Admitting: Obstetrics and Gynecology

## 2022-10-04 VITALS — BP 130/80 | HR 74 | Ht 67.5 in | Wt 164.0 lb

## 2022-10-04 DIAGNOSIS — Z01419 Encounter for gynecological examination (general) (routine) without abnormal findings: Secondary | ICD-10-CM

## 2022-10-04 DIAGNOSIS — Z1159 Encounter for screening for other viral diseases: Secondary | ICD-10-CM

## 2022-10-04 NOTE — Patient Instructions (Signed)

## 2022-10-04 NOTE — Progress Notes (Signed)
56 y.o. G51P2002 Married Caucasian female here for annual exam.    Has a rash from her sports bra when she exercises.  Skin itches.   Had a toe fracture.   She wants to avoid tx for osteoporosis.   PCP:   Dr. Carlota Raspberry  Patient's last menstrual period was 03/20/2006 (approximate).           Sexually active: Yes.    The current method of family planning is vasectomy/ post menopausal Exercising: Yes.    Gym/ health club routine includes cardio and mod to heavy weightlifting. Smoker:  no  Health Maintenance: Pap:  10/03/21 neg,  02/14/18 Neg:Neg HR HPV, 05-28-17 ASCUS:Neg HR HPV, 08-25-15 Neg:Neg HR HPV  History of abnormal Pap:  yes, 05/28/17 ASCUS:Neg HR HPV  MMG: 09/21/2022, BI-RADS CATEGORY 1 Neg Colonoscopy:  2021, normal -  BMD:   07/04/21  Result  osteoporosis TDaP:  08/12/2014 Gardasil:   no HIV:  01/01/21 NR Hep C:  donated blood in the past Screening Labs:  PCP   reports that she has never smoked. She has never used smokeless tobacco. She reports that she does not currently use alcohol. She reports that she does not use drugs.  Past Medical History:  Diagnosis Date   Anemia    Arthritis    Atypical squamous cell changes of undetermined significance (ASCUS) on cervical cytology with negative high risk human papilloma virus (HPV) test result 2018   Breast cancer ([place])    right   Cancer ([place]) 02/22/2006   rt breast cancer   Crohn's disease ([place]) dx 2004  Crohn's ileitis   04-14-2003 resection terminal ileum, cecum, and appendix   History of breast cancer oncologist-  dr Jana Hakim---  no recurrence (lov 11-29-2016)   dx 04/ 2007 --  Right breast carcinoma , invasive DCIS, Stage IIB, Grade 2 (T1c N1) ER/PR positive, HER-2 negative--  03-12-2006  s/p  right lumpectomy w/ sln bx's and dissection --  adjuvant chemo and radiation therapy completed 01/ 2008-- completed tamoxifen 01/ 2018   History of cancer chemotherapy completed 01/ 2008   right breast   History of genital warts    as  teenager--  per pt frozen and did not return   Hypertriglyceridemia    Osteopenia 2018   Osteoporosis    Personal history of chemotherapy    Personal history of radiation therapy completed 01/ 2008   right breast   PMB (postmenopausal bleeding) 06/07/2017   Shingles     Past Surgical History:  Procedure Laterality Date   ABDOMINOPLASTY [name] 12/21/2020   BREAST LUMPECTOMY Right 2007   COLONOSCOPY     D & C HYSTEROSCOPIC RESECTION POLYP  11-29-2009   dr Joan Flores   HYSTEROSCOPY WITH D & C [name] 06/14/2017   Procedure: DILATATION AND CURETTAGE /HYSTEROSCOPY;  Surgeon: Nunzio Cobbs, MD;  Location: [facility];  Service: Gynecology;  Laterality: [name];   LAPAROSCOPIC CHOLECYSTECTOMY  09/16/2002   PORT-A-CATH REMOVAL  01/03/2007   RESECTION TERMINAL ILEUM, CECUM, AND APPENDIX  04-14-2003   dr Excell Seltzer   Crohn's ileitis   RIGHT BREAST LUMPECTOMY W/ MULTIPLE AXILLARY SENTINAL NODE BX'S AND DISSECTION  03/12/2006   TRANSTHORACIC ECHOCARDIOGRAM  04/05/2006   ef 55-60%/  trivial MR and TR    Current Outpatient Medications  Medication Sig Dispense Refill   Cholecalciferol (VITAMIN D-3) 125 MCG (5000 UT) TABS Take 1 tablet by mouth See admin instructions. Monday through Friday     Cyanocobalamin (VITAMIN B 12)  500 MCG TABS Take 1,000 mcg by mouth daily.     hyoscyamine (LEVSIN) 0.125 MG tablet Take 0.125 mg by mouth once as needed for cramping.     Krill Oil 500 MG CAPS Take 1 capsule by mouth daily.     Multiple Minerals-Vitamins (CITRACAL PLUS) TABS See admin instructions.     Multiple Vitamin (MULTIVITAMIN) tablet Take 1 tablet by mouth daily.     PREVALITE 4 g packet Take 4 g by mouth once as needed (cramping).     Probiotic Product (ALIGN) 4 MG CAPS Take 1 capsule by mouth daily.     No current facility-administered medications for this visit.    Family History  Problem Relation Age of Onset   Arthritis Mother    Dementia Mother    Kidney disease Father     Heart disease Father    Hearing loss Father    Heart attack Father    Pneumonia Father        covid 79   Dementia Father    Breast cancer Sister        pre cancerous, treated   Depression Sister    Cancer Sister    Hypertension Maternal Grandmother     Review of Systems  All other systems reviewed and are negative.   Exam:   BP 130/80 (BP Location: Right Arm, Patient Position: Sitting, Cuff Size: Normal)   Pulse 74   Ht 5' 7.5" (1.715 m)   Wt 164 lb (74.4 kg)   LMP 03/20/2006 (Approximate)   SpO2 99%   BMI 25.31 kg/m     General appearance: alert, cooperative and appears stated age Head: normocephalic, without obvious abnormality, atraumatic Neck: no adenopathy, supple, symmetrical, trachea midline and thyroid normal to inspection and palpation Lungs: clear to auscultation bilaterally Breasts: normal appearance, no masses or tenderness, No nipple retraction or dimpling, No nipple discharge or bleeding, No axillary adenopathy Heart: regular rate and rhythm Abdomen: soft, non-tender; no masses, no organomegaly Extremities: extremities normal, atraumatic, no cyanosis or edema Skin: skin color, texture, turgor normal. No rashes or lesions Lymph nodes: cervical, supraclavicular, and axillary nodes normal. Neurologic: grossly normal  Pelvic: External genitalia:  Two 1 - 2 mm moderate brown flat nevi of right labia. (She states hx of at least one pigmented nevus in the area.)              No abnormal inguinal nodes palpated.              Urethra:  normal appearing urethra with no masses, tenderness or lesions              Bartholins and Skenes: normal                 Vagina: normal appearing vagina with normal color and discharge, no lesions              Cervix: no lesions              Pap taken: no Bimanual Exam:  Uterus:  normal size, contour, position, consistency, mobility, non-tender              Adnexa: no mass, fullness, tenderness              Rectal exam: yes.   Confirms.              Anus:  normal sphincter tone, no lesions  Chaperone was present for exam:  Kimalexis  Assessment:   Well woman visit  with gynecologic exam. Hx ASCUS and negative HR HPV.  Osteoporosis.   Declines treatment with prescription medication.  Early menopause.  Hx right breast cancer.  Hx partial colectomy.  Crohn's disease. Hx shingles.  Plan: Mammogram screening discussed. Self breast awareness reviewed. Pap and HR HPV as above. Guidelines for Calcium, Vitamin D, regular exercise program including cardiovascular and weight bearing exercise. Will screen for Hep C aby.  BMD next year.  Will get copy of her last colonoscopy from [facility]. Follow up annually and prn.   After visit summary provided.

## 2022-10-05 LAB — HEPATITIS C ANTIBODY: Hepatitis C Ab: NONREACTIVE

## 2022-10-25 DIAGNOSIS — H52203 Unspecified astigmatism, bilateral: Secondary | ICD-10-CM | POA: Diagnosis not present

## 2022-10-25 DIAGNOSIS — H43813 Vitreous degeneration, bilateral: Secondary | ICD-10-CM | POA: Diagnosis not present

## 2022-10-25 DIAGNOSIS — H5213 Myopia, bilateral: Secondary | ICD-10-CM | POA: Diagnosis not present

## 2022-11-21 DIAGNOSIS — D485 Neoplasm of uncertain behavior of skin: Secondary | ICD-10-CM | POA: Diagnosis not present

## 2022-11-21 DIAGNOSIS — L820 Inflamed seborrheic keratosis: Secondary | ICD-10-CM | POA: Diagnosis not present

## 2022-11-21 DIAGNOSIS — L821 Other seborrheic keratosis: Secondary | ICD-10-CM | POA: Diagnosis not present

## 2023-01-23 DIAGNOSIS — L638 Other alopecia areata: Secondary | ICD-10-CM | POA: Diagnosis not present

## 2023-03-14 ENCOUNTER — Ambulatory Visit (INDEPENDENT_AMBULATORY_CARE_PROVIDER_SITE_OTHER): Payer: BC Managed Care – PPO | Admitting: Family Medicine

## 2023-03-14 ENCOUNTER — Encounter: Payer: Self-pay | Admitting: Family Medicine

## 2023-03-14 VITALS — BP 110/68 | HR 74 | Temp 98.1°F | Ht 67.0 in | Wt 157.2 lb

## 2023-03-14 DIAGNOSIS — Z862 Personal history of diseases of the blood and blood-forming organs and certain disorders involving the immune mechanism: Secondary | ICD-10-CM | POA: Diagnosis not present

## 2023-03-14 DIAGNOSIS — L659 Nonscarring hair loss, unspecified: Secondary | ICD-10-CM

## 2023-03-14 DIAGNOSIS — Z13220 Encounter for screening for lipoid disorders: Secondary | ICD-10-CM | POA: Diagnosis not present

## 2023-03-14 DIAGNOSIS — K50119 Crohn's disease of large intestine with unspecified complications: Secondary | ICD-10-CM

## 2023-03-14 DIAGNOSIS — Z0000 Encounter for general adult medical examination without abnormal findings: Secondary | ICD-10-CM

## 2023-03-14 DIAGNOSIS — Z8739 Personal history of other diseases of the musculoskeletal system and connective tissue: Secondary | ICD-10-CM | POA: Diagnosis not present

## 2023-03-14 LAB — COMPREHENSIVE METABOLIC PANEL
ALT: 15 U/L (ref 0–35)
AST: 18 U/L (ref 0–37)
Albumin: 4.3 g/dL (ref 3.5–5.2)
Alkaline Phosphatase: 71 U/L (ref 39–117)
BUN: 14 mg/dL (ref 6–23)
CO2: 28 mEq/L (ref 19–32)
Calcium: 9.4 mg/dL (ref 8.4–10.5)
Chloride: 104 mEq/L (ref 96–112)
Creatinine, Ser: 0.75 mg/dL (ref 0.40–1.20)
GFR: 88.05 mL/min (ref >60.00)
Glucose, Bld: 82 mg/dL (ref 70–99)
Potassium: 4.4 mEq/L (ref 3.5–5.1)
Sodium: 140 mEq/L (ref 135–145)
Total Bilirubin: 0.8 mg/dL (ref 0.2–1.2)
Total Protein: 6.5 g/dL (ref 6.0–8.3)

## 2023-03-14 LAB — LIPID PANEL
Cholesterol: 162 mg/dL (ref 0–200)
HDL: 63.3 mg/dL (ref >39.00)
LDL Cholesterol: 80 mg/dL (ref 0–99)
NonHDL: 98.93
Total CHOL/HDL Ratio: 3
Triglycerides: 97 mg/dL (ref 0.0–149.0)
VLDL: 19.4 mg/dL (ref 0.0–40.0)

## 2023-03-14 LAB — CBC WITH DIFFERENTIAL/PLATELET
Basophils Absolute: 0 10*3/uL (ref 0.0–0.1)
Basophils Relative: 0.5 % (ref 0.0–3.0)
Eosinophils Absolute: 0.2 10*3/uL (ref 0.0–0.7)
Eosinophils Relative: 3.1 % (ref 0.0–5.0)
HCT: 40.6 % (ref 36.0–46.0)
Hemoglobin: 14.1 g/dL (ref 12.0–15.0)
Lymphocytes Relative: 20.6 % (ref 12.0–46.0)
Lymphs Abs: 1 10*3/uL (ref 0.7–4.0)
MCHC: 34.7 g/dL (ref 30.0–36.0)
MCV: 92.7 fl (ref 78.0–100.0)
Monocytes Absolute: 0.3 10*3/uL (ref 0.1–1.0)
Monocytes Relative: 6.1 % (ref 3.0–12.0)
Neutro Abs: 3.5 10*3/uL (ref 1.4–7.7)
Neutrophils Relative %: 69.7 % (ref 43.0–77.0)
Platelets: 211 10*3/uL (ref 150.0–400.0)
RBC: 4.38 Mil/uL (ref 3.87–5.11)
RDW: 12.7 % (ref 11.5–15.5)
WBC: 5 10*3/uL (ref 4.0–10.5)

## 2023-03-14 LAB — VITAMIN D 25 HYDROXY (VIT D DEFICIENCY, FRACTURES): VITD: 53.4 ng/mL (ref 30.00–100.00)

## 2023-03-14 LAB — TSH: TSH: 1.84 u[IU]/mL (ref 0.35–5.50)

## 2023-03-14 LAB — IRON: Iron: 102 ug/dL (ref 42–145)

## 2023-03-14 NOTE — Patient Instructions (Signed)
Thanks for coming in today.  No med changes at this time.  Keep follow-up with specialist as planned.  If any concerns on labs I will let you know.  Take care!  Preventive Care 42-58 Years Old, Female Preventive care refers to lifestyle choices and visits with your health care provider that can promote health and wellness. Preventive care visits are also called wellness exams. What can I expect for my preventive care visit? Counseling Your health care provider may ask you questions about your: Medical history, including: Past medical problems. Family medical history. Pregnancy history. Current health, including: Menstrual cycle. Method of birth control. Emotional well-being. Home life and relationship well-being. Sexual activity and sexual health. Lifestyle, including: Alcohol, nicotine or tobacco, and drug use. Access to firearms. Diet, exercise, and sleep habits. Work and work Astronomer. Sunscreen use. Safety issues such as seatbelt and bike helmet use. Physical exam Your health care provider will check your: Height and weight. These may be used to calculate your BMI (body mass index). BMI is a measurement that tells if you are at a healthy weight. Waist circumference. This measures the distance around your waistline. This measurement also tells if you are at a healthy weight and may help predict your risk of certain diseases, such as type 2 diabetes and high blood pressure. Heart rate and blood pressure. Body temperature. Skin for abnormal spots. What immunizations do I need?  Vaccines are usually given at various ages, according to a schedule. Your health care provider will recommend vaccines for you based on your age, medical history, and lifestyle or other factors, such as travel or where you work. What tests do I need? Screening Your health care provider may recommend screening tests for certain conditions. This may include: Lipid and cholesterol levels. Diabetes  screening. This is done by checking your blood sugar (glucose) after you have not eaten for a while (fasting). Pelvic exam and Pap test. Hepatitis B test. Hepatitis C test. HIV (human immunodeficiency virus) test. STI (sexually transmitted infection) testing, if you are at risk. Lung cancer screening. Colorectal cancer screening. Mammogram. Talk with your health care provider about when you should start having regular mammograms. This may depend on whether you have a family history of breast cancer. BRCA-related cancer screening. This may be done if you have a family history of breast, ovarian, tubal, or peritoneal cancers. Bone density scan. This is done to screen for osteoporosis. Talk with your health care provider about your test results, treatment options, and if necessary, the need for more tests. Follow these instructions at home: Eating and drinking  Eat a diet that includes fresh fruits and vegetables, whole grains, lean protein, and low-fat dairy products. Take vitamin and mineral supplements as recommended by your health care provider. Do not drink alcohol if: Your health care provider tells you not to drink. You are pregnant, may be pregnant, or are planning to become pregnant. If you drink alcohol: Limit how much you have to 0-1 drink a day. Know how much alcohol is in your drink. In the [place] one drink equals one 12 oz bottle of beer (355 mL), one 5 oz glass of wine (148 mL), or one 1 oz glass of hard liquor (44 mL). Lifestyle Brush your teeth every morning and night with fluoride toothpaste. Floss one time each day. Exercise for at least 30 minutes 5 or more days each week. Do not use any products that contain nicotine or tobacco. These products include cigarettes, chewing tobacco, and vaping devices,  such as e-cigarettes. If you need help quitting, ask your health care provider. Do not use drugs. If you are sexually active, practice safe sex. Use a condom or other form of  protection to prevent STIs. If you do not wish to become pregnant, use a form of birth control. If you plan to become pregnant, see your health care provider for a prepregnancy visit. Take aspirin only as told by your health care provider. Make sure that you understand how much to take and what form to take. Work with your health care provider to find out whether it is safe and beneficial for you to take aspirin daily. Find healthy ways to manage stress, such as: Meditation, yoga, or listening to music. Journaling. Talking to a trusted person. Spending time with friends and family. Minimize exposure to UV radiation to reduce your risk of skin cancer. Safety Always wear your seat belt while driving or riding in a vehicle. Do not drive: If you have been drinking alcohol. Do not ride with someone who has been drinking. When you are tired or distracted. While texting. If you have been using any mind-altering substances or drugs. Wear a helmet and other protective equipment during sports activities. If you have firearms in your house, make sure you follow all gun safety procedures. Seek help if you have been physically or sexually abused. What's next? Visit your health care provider once a year for an annual wellness visit. Ask your health care provider how often you should have your eyes and teeth checked. Stay up to date on all vaccines. This information is not intended to replace advice given to you by your health care provider. Make sure you discuss any questions you have with your health care provider. Document Revised: 05/04/2021 Document Reviewed: 05/04/2021 Elsevier Patient Education  [place].

## 2023-03-14 NOTE — Progress Notes (Signed)
Subjective:  Patient ID: Janice [name], female    DOB: 03-Apr-1966  Age: 57 y.o. MRN: [id number]  CC:  Chief Complaint  Patient presents with   Annual Exam    Pt is here today for Physical. Pt is currently seeing Dermatology and they would like labs drawn today. TSH, IRON,VITAMIN D Pt is FASTING     HPI Janice [name] presents for Annual Exam, health changes.   Gynecology, Dr. Edward Jolly,  appointment in November.  History of osteoporosis treated with calcium, vitamin D, exercise. Dermatology,  Dr. Leonie Man, treated for hair loss -  requesting labs, TSH, iron, vitamin D. She is on vit D 5k iu per day. On clobetasol for scalp. Heart palpitations with Rogaine prior.  History of anemia with prior iron supplementation. Gastroenterology, [facility] gastroenterology -history of Crohn's disease.  mesalamine as needed, Prevalite as needed. Not needed recently - controlled.   History of right breast cancer, 2007, status post lobectomy, radiation, chemo, status post tamoxifen for 10 years.  Mammogram 09/21/2022.   Lab Results  Component Value Date   CHOL 171 03/09/2022   HDL 72.30 03/09/2022   LDLCALC 74 03/09/2022   LDLDIRECT 81.0 09/15/2021   TRIG 124.0 03/09/2022   CHOLHDL 2 03/09/2022  Prior elevated trigs in 2022 - 282.  Fasting today.      03/14/2023    8:02 AM 03/09/2022    8:09 AM  Depression screen PHQ 2/9  Decreased Interest 0 0  Down, Depressed, Hopeless 0 0  PHQ - 2 Score 0 0  Altered sleeping 0   Tired, decreased energy 0   Change in appetite 0   Feeling bad or failure about yourself  0   Trouble concentrating 0   Moving slowly or fidgety/restless 0   Suicidal thoughts 0   PHQ-9 Score 0   Difficult doing work/chores Not difficult at all     Health Maintenance  Topic Date Due   COVID-19 Vaccine (1) Never done   Zoster Vaccines- Shingrix (1 of 2) Never done   INFLUENZA VACCINE  06/21/2023   DTaP/Tdap/Td (2 - Td or Tdap) 08/12/2024   MAMMOGRAM  09/21/2024    PAP SMEAR-Modifier  10/03/2024   COLONOSCOPY (Pts 45-69yrs Insurance coverage will need to be confirmed)  02/12/2030   Hepatitis C Screening  Completed   HIV Screening  Completed   HPV VACCINES  Aged Out  Colonoscopy 02/13/20 - ? Repeat 5 years.  Pap testing 2022. Negative.  Mammogram last November.  Followed by derm.   Immunization History  Administered Date(s) Administered   Tdap 08/12/2014  Shingrix - declines Covid vaccine - declines.  Declines flu vaccine.   No results found. Saw optho last year. New glasses. Doing well.   Dental: every 6 months, appt next week.   Alcohol: none.   Tobacco: none.   Exercise: 6 days per week, walking 45 min on w/e, weights and walks 1hr+ on other days.   History Patient Active Problem List   Diagnosis Date Noted   Pneumonia 01/01/2021   CAP (community acquired pneumonia) 01/01/2021   Encounter for counseling 10/19/2020   Laryngopharyngeal reflux (LPR) 05/02/2018   Stomatitis 05/02/2018   Tongue lesion 05/02/2018   Malignant neoplasm of upper-outer quadrant of right breast in female, estrogen receptor positive 09/05/2017   Breast cancer, right breast 03/08/2015   Dysuria 03/04/2014   Crohn disease 08/01/2003   Past Medical History:  Diagnosis Date   Anemia    Arthritis    Atypical squamous  cell changes of undetermined significance (ASCUS) on cervical cytology with negative high risk human papilloma virus (HPV) test result 2018   Breast cancer    right   Cancer 02/22/2006   rt breast cancer   Crohn's disease dx 2004  Crohn's ileitis   04-14-2003 resection terminal ileum, cecum, and appendix   History of breast cancer oncologist-  dr Darnelle Catalan---  no recurrence (lov 11-29-2016)   dx 04/ 2007 --  Right breast carcinoma , invasive DCIS, Stage IIB, Grade 2 (T1c N1) ER/PR positive, HER-2 negative--  03-12-2006  s/p  right lumpectomy w/ sln bx's and dissection --  adjuvant chemo and radiation therapy completed 01/ 2008-- completed  tamoxifen 01/ 2018   History of cancer chemotherapy completed 01/ 2008   right breast   History of genital warts    as teenager--  per pt frozen and did not return   Hypertriglyceridemia    Osteopenia 2018   Osteoporosis    Personal history of chemotherapy    Personal history of radiation therapy completed 01/ 2008   right breast   PMB (postmenopausal bleeding) 06/07/2017   Shingles    Past Surgical History:  Procedure Laterality Date   ABDOMINOPLASTY [name] 12/21/2020   BREAST LUMPECTOMY Right 2007   COLONOSCOPY     D & C HYSTEROSCOPIC RESECTION POLYP  11-29-2009   dr Tresa Res   HYSTEROSCOPY WITH D & C [name] 06/14/2017   Procedure: DILATATION AND CURETTAGE /HYSTEROSCOPY;  Surgeon: Patton Salles, MD;  Location: [facility];  Service: Gynecology;  Laterality: [name];   LAPAROSCOPIC CHOLECYSTECTOMY  09/16/2002   PORT-A-CATH REMOVAL  01/03/2007   RESECTION TERMINAL ILEUM, CECUM, AND APPENDIX  04-14-2003   dr Johna Sheriff   Crohn's ileitis   RIGHT BREAST LUMPECTOMY W/ MULTIPLE AXILLARY SENTINAL NODE BX'S AND DISSECTION  03/12/2006   TRANSTHORACIC ECHOCARDIOGRAM  04/05/2006   ef 55-60%/  trivial MR and TR   Allergies  Allergen Reactions   Oxycodone-Acetaminophen Other (See Comments)   Prior to Admission medications   Medication Sig Start Date End Date Taking? Authorizing Provider  Calcium Carb-Cholecalciferol 333-3.325 MG-MCG TABS Take 325 mg by mouth daily.   Yes [provider]  Cholecalciferol (VITAMIN D-3) 125 MCG (5000 UT) TABS Take 1 tablet by mouth See admin instructions. Monday through Friday   Yes [provider]  clobetasol (OLUX) 0.05 % topical foam SMARTSIG:Sparingly Topical Twice Daily 01/25/23  Yes [provider]  glucosamine-chondroitin 500-400 MG tablet Take by mouth. 05/02/18  Yes [provider]  hyoscyamine (LEVSIN) 0.125 MG tablet Take 0.125 mg by mouth once as needed for cramping.   Yes [provider]   Boris Lown Oil 500 MG CAPS Take 1 capsule by mouth daily.   Yes [provider]  Multiple Vitamin (MULTIVITAMIN) tablet Take 1 tablet by mouth daily.   Yes [provider]  omeprazole (PRILOSEC) 40 MG capsule Take by mouth. 05/30/18  Yes [provider]  PREVALITE 4 g packet Take 4 g by mouth once as needed (cramping). 10/22/20  Yes [provider]  Probiotic Product (ALIGN) 4 MG CAPS Take 1 capsule by mouth daily.   Yes [provider]  Collagen-Vitamin C-Biotin (COLLAGEN 1500/C) 500-50-0.8 MG CAPS  07/21/22   [provider]  Cyanocobalamin (VITAMIN B 12) 500 MCG TABS Take 1,000 mcg by mouth daily. Patient not taking: Reported on 03/14/2023 07/22/19   [provider]  Multiple Minerals-Vitamins (CITRACAL PLUS) TABS See admin instructions. Patient not taking: Reported  on 03/14/2023    [provider]   Social History   Socioeconomic History   Marital status: Married    Spouse name: Not on file   Number of children: Not on file   Years of education: Not on file   Highest education level: Not on file  Occupational History   Not on file  Tobacco Use   Smoking status: Never   Smokeless tobacco: Never  Vaping Use   Vaping Use: Never used  Substance and Sexual Activity   Alcohol use: Not Currently   Drug use: Never   Sexual activity: Yes    Partners: Male    Birth control/protection: Post-menopausal    Comment: husband with vasectomy  Other Topics Concern   Not on file  Social History Narrative   Not on file   Social Determinants of Health   Financial Resource Strain: Not on file  Food Insecurity: Not on file  Transportation Needs: Not on file  Physical Activity: Not on file  Stress: Not on file  Social Connections: Not on file  Intimate Partner Violence: Not on file    Review of Systems  13 point review of systems per patient health survey noted.  Negative other than as indicated above or in HPI.    Objective:   Vitals:   03/14/23 0805  BP: 110/68  Pulse: 74  Temp: 98.1 F (36.7 C)  SpO2: 97%  Weight: 157 lb 4 oz (71.3 kg)  Height:  (1.702 m)     Physical Exam Vitals reviewed.  Constitutional:      Appearance: She is well-developed.  HENT:     Head: Normocephalic and atraumatic.     Right Ear: External ear normal.     Left Ear: External ear normal.  Eyes:     Conjunctiva/sclera: Conjunctivae normal.     Pupils: Pupils are equal, round, and reactive to light.  Neck:     Thyroid: No thyromegaly.  Cardiovascular:     Rate and Rhythm: Normal rate and regular rhythm.     Heart sounds: Normal heart sounds. No murmur heard. Pulmonary:     Effort: Pulmonary effort is normal. No respiratory distress.     Breath sounds: Normal breath sounds. No wheezing.  Abdominal:     General: Bowel sounds are normal.     Palpations: Abdomen is soft.     Tenderness: There is no abdominal tenderness.  Musculoskeletal:        General: No tenderness. Normal range of motion.     Cervical back: Normal range of motion and neck supple.  Lymphadenopathy:     Cervical: No cervical adenopathy.  Skin:    General: Skin is warm and dry.     Findings: No rash.  Neurological:     Mental Status: She is alert and oriented to person, place, and time.  Psychiatric:        Behavior: Behavior normal.        Thought Content: Thought content normal.        Assessment & Plan:  Janice [name] is a 57 y.o. female . Annual physical exam  - -anticipatory guidance as below in AVS, screening labs above. Health maintenance items as above in HPI discussed/recommended as applicable.   Screening, lipid - Plan: Comp Met (CMET), Lipid panel  -Prior history of hypertriglyceridemia but stable last year.  Hair loss - Plan: TSH, Iron, Vitamin D (25 hydroxy)  -Check TSH, iron, vitamin D, follow-up with dermatology as planned, currently  on clobetasol.  Crohn's disease of colon with complication -  Plan: CBC with Differential/Platelet  -Stable, has meds if needed, no recent use.  Check CBC.  History of anemia - Plan: CBC with Differential/Platelet, Iron  -Check CBC with history of anemia, iron levels above.  History of osteoporosis - Plan: Vitamin D (25 hydroxy)  -Continue vitamin D supplementation, check levels with hair loss, follow-up with gynecology as planned.  No orders of the defined types were placed in this encounter.  Patient Instructions  Thanks for coming in today.  No med changes at this time.  Keep follow-up with specialist as planned.  If any concerns on labs I will let you know.  Take care!  Preventive Care 17-38 Years Old, Female Preventive care refers to lifestyle choices and visits with your health care provider that can promote health and wellness. Preventive care visits are also called wellness exams. What can I expect for my preventive care visit? Counseling Your health care provider may ask you questions about your: Medical history, including: Past medical problems. Family medical history. Pregnancy history. Current health, including: Menstrual cycle. Method of birth control. Emotional well-being. Home life and relationship well-being. Sexual activity and sexual health. Lifestyle, including: Alcohol, nicotine or tobacco, and drug use. Access to firearms. Diet, exercise, and sleep habits. Work and work Astronomer. Sunscreen use. Safety issues such as seatbelt and bike helmet use. Physical exam Your health care provider will check your: Height and weight. These may be used to calculate your BMI (body mass index). BMI is a measurement that tells if you are at a healthy weight. Waist circumference. This measures the distance around your waistline. This measurement also tells if you are at a healthy weight and may help predict your risk of certain diseases, such as type 2 diabetes and high blood pressure. Heart rate and blood pressure. Body  temperature. Skin for abnormal spots. What immunizations do I need?  Vaccines are usually given at various ages, according to a schedule. Your health care provider will recommend vaccines for you based on your age, medical history, and lifestyle or other factors, such as travel or where you work. What tests do I need? Screening Your health care provider may recommend screening tests for certain conditions. This may include: Lipid and cholesterol levels. Diabetes screening. This is done by checking your blood sugar (glucose) after you have not eaten for a while (fasting). Pelvic exam and Pap test. Hepatitis B test. Hepatitis C test. HIV (human immunodeficiency virus) test. STI (sexually transmitted infection) testing, if you are at risk. Lung cancer screening. Colorectal cancer screening. Mammogram. Talk with your health care provider about when you should start having regular mammograms. This may depend on whether you have a family history of breast cancer. BRCA-related cancer screening. This may be done if you have a family history of breast, ovarian, tubal, or peritoneal cancers. Bone density scan. This is done to screen for osteoporosis. Talk with your health care provider about your test results, treatment options, and if necessary, the need for more tests. Follow these instructions at home: Eating and drinking  Eat a diet that includes fresh fruits and vegetables, whole grains, lean protein, and low-fat dairy products. Take vitamin and mineral supplements as recommended by your health care provider. Do not drink alcohol if: Your health care provider tells you not to drink. You are pregnant, may be pregnant, or are planning to become pregnant. If you drink alcohol: Limit how much you have to 0-1 drink a  day. Know how much alcohol is in your drink. In the [place] one drink equals one 12 oz bottle of beer (355 mL), one 5 oz glass of wine (148 mL), or one 1 oz glass of hard liquor (44  mL). Lifestyle Brush your teeth every morning and night with fluoride toothpaste. Floss one time each day. Exercise for at least 30 minutes 5 or more days each week. Do not use any products that contain nicotine or tobacco. These products include cigarettes, chewing tobacco, and vaping devices, such as e-cigarettes. If you need help quitting, ask your health care provider. Do not use drugs. If you are sexually active, practice safe sex. Use a condom or other form of protection to prevent STIs. If you do not wish to become pregnant, use a form of birth control. If you plan to become pregnant, see your health care provider for a prepregnancy visit. Take aspirin only as told by your health care provider. Make sure that you understand how much to take and what form to take. Work with your health care provider to find out whether it is safe and beneficial for you to take aspirin daily. Find healthy ways to manage stress, such as: Meditation, yoga, or listening to music. Journaling. Talking to a trusted person. Spending time with friends and family. Minimize exposure to UV radiation to reduce your risk of skin cancer. Safety Always wear your seat belt while driving or riding in a vehicle. Do not drive: If you have been drinking alcohol. Do not ride with someone who has been drinking. When you are tired or distracted. While texting. If you have been using any mind-altering substances or drugs. Wear a helmet and other protective equipment during sports activities. If you have firearms in your house, make sure you follow all gun safety procedures. Seek help if you have been physically or sexually abused. What's next? Visit your health care provider once a year for an annual wellness visit. Ask your health care provider how often you should have your eyes and teeth checked. Stay up to date on all vaccines. This information is not intended to replace advice given to you by your health care  provider. Make sure you discuss any questions you have with your health care provider. Document Revised: 05/04/2021 Document Reviewed: 05/04/2021 Elsevier Patient Education  2023 Elsevier Inc.     Signed,   Meredith Staggers, MD [facility], [facility] 03/14/23 8:36 AM

## 2023-03-22 ENCOUNTER — Other Ambulatory Visit: Payer: Self-pay | Admitting: Obstetrics and Gynecology

## 2023-03-22 DIAGNOSIS — Z1231 Encounter for screening mammogram for malignant neoplasm of breast: Secondary | ICD-10-CM

## 2023-05-02 ENCOUNTER — Encounter: Payer: Self-pay | Admitting: Obstetrics and Gynecology

## 2023-05-02 ENCOUNTER — Telehealth: Payer: Self-pay | Admitting: Obstetrics and Gynecology

## 2023-05-02 ENCOUNTER — Ambulatory Visit (INDEPENDENT_AMBULATORY_CARE_PROVIDER_SITE_OTHER): Payer: BC Managed Care – PPO | Admitting: Obstetrics and Gynecology

## 2023-05-02 VITALS — BP 126/78 | HR 74 | Ht 67.5 in | Wt 157.0 lb

## 2023-05-02 DIAGNOSIS — N6315 Unspecified lump in the right breast, overlapping quadrants: Secondary | ICD-10-CM

## 2023-05-02 DIAGNOSIS — N644 Mastodynia: Secondary | ICD-10-CM

## 2023-05-02 DIAGNOSIS — R2231 Localized swelling, mass and lump, right upper limb: Secondary | ICD-10-CM | POA: Diagnosis not present

## 2023-05-02 NOTE — Progress Notes (Signed)
GYNECOLOGY  VISIT   HPI: 57 y.o.   Married  Caucasian  female   G2P2002 with Patient's last menstrual period was 03/20/2006 (approximate).   here for   R breast pain in different areas since last visit in November.  The superior and right lateral breast are tender to touch.  Her right axillary area changed its appearance.  Hx right breast cancer.   No trauma.   Does lift weights.   No recent vaccines.   Not taking any hormone therapy.   GYNECOLOGIC HISTORY: Patient's last menstrual period was 03/20/2006 (approximate). Contraception:  PMP Menopausal hormone therapy:  n/a Last mammogram:  09/21/22 Breast Density Cat B, BI-RADS CAT 1 neg Last pap smear:   10/03/21 neg, 02/14/18 neg: HR HPV neg        OB History     Gravida  2   Para  2   Term  2   Preterm      AB      Living  2      SAB      IAB      Ectopic      Multiple      Live Births                 Patient Active Problem List   Diagnosis Date Noted   Pneumonia 01/01/2021   CAP (community acquired pneumonia) 01/01/2021   Encounter for counseling 10/19/2020   Laryngopharyngeal reflux (LPR) 05/02/2018   Stomatitis 05/02/2018   Tongue lesion 05/02/2018   Malignant neoplasm of upper-outer quadrant of right breast in female, estrogen receptor positive (HCC) 09/05/2017   Breast cancer, right breast (HCC) 03/08/2015   Dysuria 03/04/2014   Crohn disease (HCC) 08/01/2003    Past Medical History:  Diagnosis Date   Anemia    Arthritis    Atypical squamous cell changes of undetermined significance (ASCUS) on cervical cytology with negative high risk human papilloma virus (HPV) test result 2018   Breast cancer (HCC)    right   Cancer (HCC) 02/22/2006   rt breast cancer   Crohn's disease (HCC) dx 2004  Crohn's ileitis   04-14-2003 resection terminal ileum, cecum, and appendix   History of breast cancer oncologist-  dr Darnelle Catalan---  no recurrence (lov 11-29-2016)   dx 04/ 2007 --  Right breast  carcinoma , invasive DCIS, Stage IIB, Grade 2 (T1c N1) ER/PR positive, HER-2 negative--  03-12-2006  s/p  right lumpectomy w/ sln bx's and dissection --  adjuvant chemo and radiation therapy completed 01/ 2008-- completed tamoxifen 01/ 2018   History of cancer chemotherapy completed 01/ 2008   right breast   History of genital warts    as teenager--  per pt frozen and did not return   Hypertriglyceridemia    Osteopenia 2018   Osteoporosis    Personal history of chemotherapy    Personal history of radiation therapy completed 01/ 2008   right breast   PMB (postmenopausal bleeding) 06/07/2017   Shingles     Past Surgical History:  Procedure Laterality Date   ABDOMINOPLASTY [name] 12/21/2020   BREAST LUMPECTOMY Right 2007   COLONOSCOPY     D & C HYSTEROSCOPIC RESECTION POLYP  11-29-2009   dr Tresa Res   HYSTEROSCOPY WITH D & C [name] 06/14/2017   Procedure: DILATATION AND CURETTAGE /HYSTEROSCOPY;  Surgeon: Patton Salles, MD;  Location: [facility];  Service: Gynecology;  Laterality: [name];   LAPAROSCOPIC CHOLECYSTECTOMY  09/16/2002  PORT-A-CATH REMOVAL  01/03/2007   RESECTION TERMINAL ILEUM, CECUM, AND APPENDIX  04-14-2003   dr Johna Sheriff   Crohn's ileitis   RIGHT BREAST LUMPECTOMY W/ MULTIPLE AXILLARY SENTINAL NODE BX'S AND DISSECTION  03/12/2006   TRANSTHORACIC ECHOCARDIOGRAM  04/05/2006   ef 55-60%/  trivial MR and TR    Current Outpatient Medications  Medication Sig Dispense Refill   Calcium Carb-Cholecalciferol 333-3.325 MG-MCG TABS Take 325 mg by mouth daily.     Cholecalciferol (VITAMIN D-3) 125 MCG (5000 UT) TABS Take 1 tablet by mouth See admin instructions. Monday through Friday     clobetasol (OLUX) 0.05 % topical foam SMARTSIG:Sparingly Topical Twice Daily     hyoscyamine (LEVSIN) 0.125 MG tablet Take 0.125 mg by mouth once as needed for cramping.     Multiple Vitamin (MULTIVITAMIN) tablet Take 1 tablet by mouth daily.     omeprazole (PRILOSEC) 40 MG  capsule Take by mouth.     PREVALITE 4 g packet Take 4 g by mouth once as needed (cramping).     Probiotic Product (ALIGN) 4 MG CAPS Take 1 capsule by mouth daily.     No current facility-administered medications for this visit.     ALLERGIES: Oxycodone-acetaminophen  Family History  Problem Relation Age of Onset   Arthritis Mother    Dementia Mother    Kidney disease Father    Heart disease Father    Hearing loss Father    Heart attack Father    Pneumonia Father        covid 87   Dementia Father    Breast cancer Sister        pre cancerous, treated   Depression Sister    Cancer Sister    Hypertension Maternal Grandmother     Social History   Socioeconomic History   Marital status: Married    Spouse name: Not on file   Number of children: Not on file   Years of education: Not on file   Highest education level: Not on file  Occupational History   Not on file  Tobacco Use   Smoking status: Never   Smokeless tobacco: Never  Vaping Use   Vaping Use: Never used  Substance and Sexual Activity   Alcohol use: Not Currently   Drug use: Never   Sexual activity: Yes    Partners: Male    Birth control/protection: Post-menopausal    Comment: husband with vasectomy  Other Topics Concern   Not on file  Social History Narrative   Not on file   Social Determinants of Health   Financial Resource Strain: Not on file  Food Insecurity: Not on file  Transportation Needs: Not on file  Physical Activity: Not on file  Stress: Not on file  Social Connections: Not on file  Intimate Partner Violence: Not on file    Review of Systems  All other systems reviewed and are negative.   PHYSICAL EXAMINATION:    BP 126/78 (BP Location: Left Arm, Patient Position: Sitting, Cuff Size: Normal)   Pulse 74   Ht 5' 7.5" (1.715 m)   Wt 157 lb (71.2 kg)   LMP 03/20/2006 (Approximate)   SpO2 97%   BMI 24.23 kg/m     General appearance: alert, cooperative and appears stated  age Neck: no adenopathy, supple, symmetrical, trachea midline and thyroid normal to inspection and palpation   Breasts: left - normal appearance, no masses or tenderness, No nipple retraction or dimpling, No nipple discharge or bleeding, No axillary or  supraclavicular adenopathy Right - right lateral breast scar, tenderness of superior breast and right lateral breast, lateral to the scar, 4 mm firm subcutaneous mass at 9:00 position at edge of the areola. Right axillary fullness.  Chaperone was present for exam:  Warren Lacy, CMA  ASSESSMENT  Right breast pain.  Right breast mass.  Right axillary fullness. Hx right breast cancer.    PLAN  Will proceed with Dx right mammogram, right breast ultrasound and [facility] [place] [facility].  Ibuprofen, heat, and ice discussed to treat discomfort.  Fu prn.   20 min  total time was spent for this patient encounter, including preparation, face-to-face counseling with the patient, coordination of care, and documentation of the encounter.

## 2023-05-02 NOTE — Telephone Encounter (Signed)
Please schedule a dx right mammogram, right breast US, and right axillary [place] [facility] for my patient.   She has tenderness of the superior right breast and right lateral breast, a 4 mm firm subcutaneous mass at 9:00 position at edge of the areola, and right axillary fullness.  She has a history of right breast cancer.

## 2023-05-03 NOTE — Telephone Encounter (Signed)
Pt scheduled for 05/18/2023 @ 1pm. Pt notified and voiced understanding. Will route to provider and close.

## 2023-05-16 ENCOUNTER — Ambulatory Visit
Admission: RE | Admit: 2023-05-16 | Discharge: 2023-05-16 | Disposition: A | Discharge status: Home / Self Care | Payer: BC Managed Care – PPO | Source: Ambulatory Visit | Attending: Obstetrics and Gynecology | Admitting: Obstetrics and Gynecology

## 2023-05-16 DIAGNOSIS — N644 Mastodynia: Secondary | ICD-10-CM

## 2023-05-16 DIAGNOSIS — N6315 Unspecified lump in the right breast, overlapping quadrants: Secondary | ICD-10-CM

## 2023-05-16 DIAGNOSIS — R921 Mammographic calcification found on diagnostic imaging of breast: Secondary | ICD-10-CM | POA: Diagnosis not present

## 2023-05-18 ENCOUNTER — Other Ambulatory Visit: Payer: BC Managed Care – PPO

## 2023-09-24 ENCOUNTER — Ambulatory Visit
Admission: RE | Admit: 2023-09-24 | Discharge: 2023-09-24 | Disposition: A | Discharge status: Home / Self Care | Payer: BC Managed Care – PPO | Source: Ambulatory Visit | Attending: Obstetrics and Gynecology | Admitting: Obstetrics and Gynecology

## 2023-09-24 DIAGNOSIS — Z1231 Encounter for screening mammogram for malignant neoplasm of breast: Secondary | ICD-10-CM

## 2023-09-24 NOTE — Progress Notes (Signed)
57 y.o. G31P2002 Married Caucasian female here for annual exam.    She is wanting to avoid medical therapy for osteoporosis.   She declines vaccines.   PCP: Shade Flood, MD   Patient's last menstrual period was 03/20/2006 (approximate).           Sexually active: Yes.    The current method of family planning is post menopausal status/vasectomy.    Menopausal hormone therapy:  n/a Exercising: Yes.     Walking 6x a week, 4x a week with weight training Smoker:  no  OB History  Gravida Para Term Preterm AB Living  2 2 2     2   SAB IAB Ectopic Multiple Live Births               # Outcome Date GA Lbr Len/2nd Weight Sex Type Anes PTL Lv  2 Term           1 Term              HEALTH MAINTENANCE:    Component Value Date/Time   DIAGPAP  10/03/2021 1554    - Negative for intraepithelial lesion or malignancy (NILM)   DIAGPAP  02/14/2018 0000    NEGATIVE FOR INTRAEPITHELIAL LESIONS OR MALIGNANCY.   DIAGPAP (A) 05/28/2017 0000    ATYPICAL SQUAMOUS CELLS OF UNDETERMINED SIGNIFICANCE (ASC-US).   ADEQPAP  10/03/2021 1554    Satisfactory for evaluation; transformation zone component PRESENT.   ADEQPAP  02/14/2018 0000    Satisfactory for evaluation  endocervical/transformation zone component PRESENT.   ADEQPAP (A) 05/28/2017 0000    Satisfactory for evaluation  endocervical/transformation zone component PRESENT.    History of abnormal Pap or positive HPV:  yes Mammogram: 09/24/23 Breast density Cat A, BI-RADS CAT 1 neg Colonoscopy:  02/13/20- due in 2026 per patient.  Bone Density:  07/04/21  Result  osteoporotic   Immunization History  Administered Date(s) Administered   Tdap 08/12/2014      reports that she has never smoked. She has never used smokeless tobacco. She reports that she does not currently use alcohol. She reports that she does not use drugs.  Past Medical History:  Diagnosis Date   Anemia    Arthritis    Atypical squamous cell changes of undetermined  significance (ASCUS) on cervical cytology with negative high risk human papilloma virus (HPV) test result 2018   Breast cancer (HCC)    right   Cancer (HCC) 02/22/2006   rt breast cancer   Crohn's disease (HCC) dx 2004  Crohn's ileitis   04-14-2003 resection terminal ileum, cecum, and appendix   History of breast cancer oncologist-  dr Darnelle Catalan---  no recurrence (lov 11-29-2016)   dx 04/ 2007 --  Right breast carcinoma , invasive DCIS, Stage IIB, Grade 2 (T1c N1) ER/PR positive, HER-2 negative--  03-12-2006  s/p  right lumpectomy w/ sln bx's and dissection --  adjuvant chemo and radiation therapy completed 01/ 2008-- completed tamoxifen 01/ 2018   History of cancer chemotherapy completed 01/ 2008   right breast   History of genital warts    as teenager--  per pt frozen and did not return   Hypertriglyceridemia    Osteopenia 2018   Osteoporosis    Personal history of chemotherapy    Personal history of radiation therapy completed 01/ 2008   right breast   PMB (postmenopausal bleeding) 06/07/2017   Shingles     Past Surgical History:  Procedure Laterality Date   ABDOMINOPLASTY [name] 12/21/2020  BREAST LUMPECTOMY Right 2007   COLONOSCOPY     D & C HYSTEROSCOPIC RESECTION POLYP  11-29-2009   dr Tresa Res   HYSTEROSCOPY WITH D & C [name] 06/14/2017   Procedure: DILATATION AND CURETTAGE /HYSTEROSCOPY;  Surgeon: Patton Salles, MD;  Location: [facility] [place];  Service: Gynecology;  Laterality: [name];   LAPAROSCOPIC CHOLECYSTECTOMY  09/16/2002   PORT-A-CATH REMOVAL  01/03/2007   RESECTION TERMINAL ILEUM, CECUM, AND APPENDIX  04-14-2003   dr Johna Sheriff   Crohn's ileitis   RIGHT BREAST LUMPECTOMY W/ MULTIPLE AXILLARY SENTINAL NODE BX'S AND DISSECTION  03/12/2006   TRANSTHORACIC ECHOCARDIOGRAM  04/05/2006   ef 55-60%/  trivial MR and TR    Current Outpatient Medications  Medication Sig Dispense Refill   Calcium Carb-Cholecalciferol 333-3.325 MG-MCG TABS Take 325 mg by mouth  daily.     Cholecalciferol (VITAMIN D-3) 125 MCG (5000 UT) TABS Take 1 tablet by mouth See admin instructions. Monday through Friday     Collagen-Vitamin C-Biotin (COLLAGEN 1500/C) 500-50-0.8 MG CAPS      hyoscyamine (LEVSIN) 0.125 MG tablet Take 0.125 mg by mouth once as needed for cramping.     Menaquinone-7 (VITAMIN K2) 100 MCG CAPS      Multiple Vitamin (MULTIVITAMIN) tablet Take 1 tablet by mouth daily.     Omega-3 1000 MG CAPS      Probiotic Product (ALIGN) 4 MG CAPS Take 1 capsule by mouth daily.     No current facility-administered medications for this visit.    ALLERGIES: Oxycodone-acetaminophen  Family History  Problem Relation Age of Onset   Arthritis Mother    Dementia Mother    Kidney disease Father    Heart disease Father    Hearing loss Father    Heart attack Father    Pneumonia Father        covid 67   Dementia Father    Breast cancer Sister        pre cancerous, treated   Depression Sister    Cancer Sister    Hypertension Maternal Grandmother     Review of Systems  All other systems reviewed and are negative.   PHYSICAL EXAM:  BP 126/84 (BP Location: Left Arm, Patient Position: Sitting, Cuff Size: Normal)   Ht 5' 6.25" (1.683 m)   Wt 159 lb (72.1 kg)   LMP 03/20/2006 (Approximate)   BMI 25.47 kg/m     General appearance: alert, cooperative and appears stated age Head: normocephalic, without obvious abnormality, atraumatic Neck: no adenopathy, supple, symmetrical, trachea midline and thyroid normal to inspection and palpation Lungs: clear to auscultation bilaterally Breasts:  Right - normal appearance, right breast at edge of areola 8:00 - 9:00 is a 4 mm firm mass, no masses or tenderness, No nipple retraction or dimpling, No nipple discharge or bleeding, No axillary adenopathy Left - normal appearance, no masses or tenderness, No nipple retraction or dimpling, No nipple discharge or bleeding, No axillary adenopathy Heart: regular rate and  rhythm Abdomen: soft, non-tender; no masses, no organomegaly Extremities: extremities normal, atraumatic, no cyanosis or edema Skin: skin color, texture, turgor normal. No rashes or lesions Lymph nodes: cervical, supraclavicular, and axillary nodes normal. Neurologic: grossly normal  Pelvic: External genitalia:  right labia majora with a 2 - 3 mm dark brown well defined nevus.                No abnormal inguinal nodes palpated.  Urethra:  normal appearing urethra with no masses, tenderness or lesions              Bartholins and Skenes: normal                 Vagina: normal appearing vagina with normal color and discharge, no lesions              Cervix: no lesions              Pap taken: No. Bimanual Exam:  Uterus:  normal size, contour, position, consistency, mobility, non-tender              Adnexa: no mass, fullness, tenderness              Rectal exam: Yes.  .  Confirms.              Anus:  normal sphincter tone, no lesions  Chaperone was present for exam:  Warren Lacy, CMA  ASSESSMENT: Well woman visit with gynecologic exam Hx ASCUS and negative HR HPV.  Osteoporosis.   Declines treatment with prescription medication.  Early menopause.  Hx right breast cancer.  Right breast lump at 8:00 - 9:00 at edge of the areola, 4 mm.  Stable.  Negative diagnostic breast imaging showing dystrophic calcifications in 2024.  Right vulvar labia majora 2 - 3 mm dark nevus. Essentially unchanged. Hx partial colectomy.  Crohn's disease. Hx shingles.  PLAN: Mammogram screening discussed. Self breast awareness reviewed. We discussed that she can request to have the right breast mass removed even if it appears benign on breast imaging.   Pap and HRV collected:  No.  Due in 2025.  Guidelines for Calcium, Vitamin D, regular exercise program including cardiovascular and weight bearing exercise. Medication refills:  none BMD at [facility].  She will call to schedule. Labs with  PCP. Follow up:  1 year and prn.

## 2023-10-08 ENCOUNTER — Ambulatory Visit (INDEPENDENT_AMBULATORY_CARE_PROVIDER_SITE_OTHER): Payer: BC Managed Care – PPO | Admitting: Obstetrics and Gynecology

## 2023-10-08 ENCOUNTER — Encounter: Payer: Self-pay | Admitting: Obstetrics and Gynecology

## 2023-10-08 VITALS — BP 126/84 | Ht 66.25 in | Wt 159.0 lb

## 2023-10-08 DIAGNOSIS — Z01419 Encounter for gynecological examination (general) (routine) without abnormal findings: Secondary | ICD-10-CM

## 2023-10-08 DIAGNOSIS — M810 Age-related osteoporosis without current pathological fracture: Secondary | ICD-10-CM

## 2023-10-08 NOTE — Patient Instructions (Signed)

## 2023-11-21 DIAGNOSIS — M810 Age-related osteoporosis without current pathological fracture: Secondary | ICD-10-CM

## 2023-11-21 HISTORY — DX: Age-related osteoporosis without current pathological fracture: M81.0

## 2023-12-31 DIAGNOSIS — S20219A Contusion of unspecified front wall of thorax, initial encounter: Secondary | ICD-10-CM | POA: Diagnosis not present

## 2024-01-30 DIAGNOSIS — L720 Epidermal cyst: Secondary | ICD-10-CM | POA: Diagnosis not present

## 2024-01-30 DIAGNOSIS — L570 Actinic keratosis: Secondary | ICD-10-CM | POA: Diagnosis not present

## 2024-03-14 ENCOUNTER — Ambulatory Visit (INDEPENDENT_AMBULATORY_CARE_PROVIDER_SITE_OTHER): Payer: BC Managed Care – PPO | Admitting: Family Medicine

## 2024-03-14 ENCOUNTER — Encounter: Payer: Self-pay | Admitting: Family Medicine

## 2024-03-14 VITALS — BP 118/72 | HR 74 | Temp 97.9°F | Ht 66.54 in | Wt 161.0 lb

## 2024-03-14 DIAGNOSIS — L659 Nonscarring hair loss, unspecified: Secondary | ICD-10-CM | POA: Diagnosis not present

## 2024-03-14 DIAGNOSIS — K50119 Crohn's disease of large intestine with unspecified complications: Secondary | ICD-10-CM

## 2024-03-14 DIAGNOSIS — Z0000 Encounter for general adult medical examination without abnormal findings: Secondary | ICD-10-CM | POA: Diagnosis not present

## 2024-03-14 DIAGNOSIS — Z8739 Personal history of other diseases of the musculoskeletal system and connective tissue: Secondary | ICD-10-CM

## 2024-03-14 DIAGNOSIS — Z13220 Encounter for screening for lipoid disorders: Secondary | ICD-10-CM

## 2024-03-14 DIAGNOSIS — Z862 Personal history of diseases of the blood and blood-forming organs and certain disorders involving the immune mechanism: Secondary | ICD-10-CM | POA: Diagnosis not present

## 2024-03-14 LAB — COMPREHENSIVE METABOLIC PANEL WITH GFR
ALT: 18 U/L (ref 0–35)
AST: 20 U/L (ref 0–37)
Albumin: 4.6 g/dL (ref 3.5–5.2)
Alkaline Phosphatase: 76 U/L (ref 39–117)
BUN: 11 mg/dL (ref 6–23)
CO2: 32 meq/L (ref 19–32)
Calcium: 9.9 mg/dL (ref 8.4–10.5)
Chloride: 102 meq/L (ref 96–112)
Creatinine, Ser: 0.65 mg/dL (ref 0.40–1.20)
GFR: 96.69 mL/min (ref >60.00)
Glucose, Bld: 89 mg/dL (ref 70–99)
Potassium: 4.9 meq/L (ref 3.5–5.1)
Sodium: 139 meq/L (ref 135–145)
Total Bilirubin: 1 mg/dL (ref 0.2–1.2)
Total Protein: 6.8 g/dL (ref 6.0–8.3)

## 2024-03-14 LAB — CBC
HCT: 42.8 % (ref 36.0–46.0)
Hemoglobin: 14.5 g/dL (ref 12.0–15.0)
MCHC: 33.9 g/dL (ref 30.0–36.0)
MCV: 93.3 fl (ref 78.0–100.0)
Platelets: 210 10*3/uL (ref 150.0–400.0)
RBC: 4.59 Mil/uL (ref 3.87–5.11)
RDW: 13.1 % (ref 11.5–15.5)
WBC: 5.5 10*3/uL (ref 4.0–10.5)

## 2024-03-14 LAB — LIPID PANEL
Cholesterol: 176 mg/dL (ref 0–200)
HDL: 74.1 mg/dL (ref >39.00)
LDL Cholesterol: 84 mg/dL (ref 0–99)
NonHDL: 102.39
Total CHOL/HDL Ratio: 2
Triglycerides: 94 mg/dL (ref 0.0–149.0)
VLDL: 18.8 mg/dL (ref 0.0–40.0)

## 2024-03-14 LAB — VITAMIN D 25 HYDROXY (VIT D DEFICIENCY, FRACTURES): VITD: 48.81 ng/mL (ref 30.00–100.00)

## 2024-03-14 LAB — TSH: TSH: 2.05 u[IU]/mL (ref 0.35–5.50)

## 2024-03-14 NOTE — Patient Instructions (Signed)
 Thank you for coming in today.  If any concerns on labs I will let you know.  If any questions from your visit or other concerns, please reach out.  As we discussed there is an option of a virtual visit if needed.  Have a great trip to the beach and take care!  Preventive Care 75-59 Years Old, Female Preventive care refers to lifestyle choices and visits with your health care provider that can promote health and wellness. Preventive care visits are also called wellness exams. What can I expect for my preventive care visit? Counseling Your health care provider may ask you questions about your: Medical history, including: Past medical problems. Family medical history. Pregnancy history. Current health, including: Menstrual cycle. Method of birth control. Emotional well-being. Home life and relationship well-being. Sexual activity and sexual health. Lifestyle, including: Alcohol, nicotine or tobacco, and drug use. Access to firearms. Diet, exercise, and sleep habits. Work and work Astronomer. Sunscreen use. Safety issues such as seatbelt and bike helmet use. Physical exam Your health care provider will check your: Height and weight. These may be used to calculate your BMI (body mass index). BMI is a measurement that tells if you are at a healthy weight. Waist circumference. This measures the distance around your waistline. This measurement also tells if you are at a healthy weight and may help predict your risk of certain diseases, such as type 2 diabetes and high blood pressure. Heart rate and blood pressure. Body temperature. Skin for abnormal spots. What immunizations do I need?  Vaccines are usually given at various ages, according to a schedule. Your health care provider will recommend vaccines for you based on your age, medical history, and lifestyle or other factors, such as travel or where you work. What tests do I need? Screening Your health care provider may recommend  screening tests for certain conditions. This may include: Lipid and cholesterol levels. Diabetes screening. This is done by checking your blood sugar (glucose) after you have not eaten for a while (fasting). Pelvic exam and Pap test. Hepatitis B test. Hepatitis C test. HIV (human immunodeficiency virus) test. STI (sexually transmitted infection) testing, if you are at risk. Lung cancer screening. Colorectal cancer screening. Mammogram. Talk with your health care provider about when you should start having regular mammograms. This may depend on whether you have a family history of breast cancer. BRCA-related cancer screening. This may be done if you have a family history of breast, ovarian, tubal, or peritoneal cancers. Bone density scan. This is done to screen for osteoporosis. Talk with your health care provider about your test results, treatment options, and if necessary, the need for more tests. Follow these instructions at home: Eating and drinking  Eat a diet that includes fresh fruits and vegetables, whole grains, lean protein, and low-fat dairy products. Take vitamin and mineral supplements as recommended by your health care provider. Do not drink alcohol if: Your health care provider tells you not to drink. You are pregnant, may be pregnant, or are planning to become pregnant. If you drink alcohol: Limit how much you have to 0-1 drink a day. Know how much alcohol is in your drink. In the [place] one drink equals one 12 oz bottle of beer (355 mL), one 5 oz glass of wine (148 mL), or one 1 oz glass of hard liquor (44 mL). Lifestyle Brush your teeth every morning and night with fluoride toothpaste. Floss one time each day. Exercise for at least 30 minutes 5 or more  days each week. Do not use any products that contain nicotine or tobacco. These products include cigarettes, chewing tobacco, and vaping devices, such as e-cigarettes. If you need help quitting, ask your health care  provider. Do not use drugs. If you are sexually active, practice safe sex. Use a condom or other form of protection to prevent STIs. If you do not wish to become pregnant, use a form of birth control. If you plan to become pregnant, see your health care provider for a prepregnancy visit. Take aspirin only as told by your health care provider. Make sure that you understand how much to take and what form to take. Work with your health care provider to find out whether it is safe and beneficial for you to take aspirin daily. Find healthy ways to manage stress, such as: Meditation, yoga, or listening to music. Journaling. Talking to a trusted person. Spending time with friends and family. Minimize exposure to UV radiation to reduce your risk of skin cancer. Safety Always wear your seat belt while driving or riding in a vehicle. Do not drive: If you have been drinking alcohol. Do not ride with someone who has been drinking. When you are tired or distracted. While texting. If you have been using any mind-altering substances or drugs. Wear a helmet and other protective equipment during sports activities. If you have firearms in your house, make sure you follow all gun safety procedures. Seek help if you have been physically or sexually abused. What's next? Visit your health care provider once a year for an annual wellness visit. Ask your health care provider how often you should have your eyes and teeth checked. Stay up to date on all vaccines. This information is not intended to replace advice given to you by your health care provider. Make sure you discuss any questions you have with your health care provider. Document Revised: 05/04/2021 Document Reviewed: 05/04/2021 Elsevier Patient Education  2024 [employer].

## 2024-03-14 NOTE — Progress Notes (Signed)
 Subjective:  Patient ID: Janice [name], female    DOB: 1966-08-28  Age: 58 y.o. MRN: [id number]  CC:  Chief Complaint  Patient presents with   Annual Exam    Patient states no concerns   Not fasting     HPI Janice [name] presents for Annual Exam No health changes or acute concerns.   PCP, me Gynecology, Dr. Colvin Dec.  History of osteoporosis treated with calcium, vitamin D , exercise.  Bone density scheduled 05/28/2024. Visit 10/07/24. Unknown if pap at that time.  Intermittent Calcium, on vit D.  Dermatology, Dr. Ivonne Marry, treated for hair loss.  On vitamin D  supplementation, no current hair treatments.  G[facility]enter[facility], [facility] - prior Dr. Dellis Fermo prior. History of Crohn's with mesalamine as needed, Prevalite as needed - no recent need. Rare immodium.   History of right breast cancer in 2007 status post lobectomy, chemo, radiation and tamoxifen  for 10 years.  Mammogram 09/24/2023 - repeat 1 year.   Hyperlipidemia: History of elevated triglycerides but normal lipids last year.  No meds. Lab Results  Component Value Date   CHOL 162 03/14/2023   HDL 63.30 03/14/2023   LDLCALC 80 03/14/2023   LDLDIRECT 81.0 09/15/2021   TRIG 97.0 03/14/2023   CHOLHDL 3 03/14/2023   Lab Results  Component Value Date   ALT 15 03/14/2023   AST 18 03/14/2023   ALKPHOS 71 03/14/2023   BILITOT 0.8 03/14/2023       03/14/2023    8:02 AM 03/09/2022    8:09 AM  Depression screen PHQ 2/9  Decreased Interest 0 0  Down, Depressed, Hopeless 0 0  PHQ - 2 Score 0 0  Altered sleeping 0   Tired, decreased energy 0   Change in appetite 0   Feeling bad or failure about yourself  0   Trouble concentrating 0   Moving slowly or fidgety/restless 0   Suicidal thoughts 0   PHQ-9 Score 0   Difficult doing work/chores Not difficult at all     Health Maintenance  Topic Date Due   COVID-19 Vaccine (1) Never done   Zoster Vaccines- Shingrix (1 of 2) Never done   INFLUENZA VACCINE  06/20/2024    DTaP/Tdap/Td (2 - Td or Tdap) 08/12/2024   Cervical Cancer Screening (HPV/Pap Cotest)  10/03/2024   MAMMOGRAM  09/23/2025   Colonoscopy  02/12/2030   Hepatitis C Screening  Completed   HIV Screening  Completed   HPV VACCINES  Aged Out   Meningococcal B Vaccine  Aged Out  Colonoscopy 2021 repeat 5 years? She will check her records or call [facility]. Pap testing 2022, then followed by GYN, recent visit.  Followed by dermatology as above.  Immunization History  Administered Date(s) Administered   Tdap 08/12/2014  She has declined shingles vaccine, COVID booster as well as flu vaccine previously.   Pneumonia vaccine recommended - declined.   No results found.  Has seen Optho - prior to last visit - 2 year follow up. new glasses discussed at her last physical in April 2024.  Doing well.  No acute changes.  Dental: Every 6 months to a year. No recent dental issues.   Alcohol: none.   Tobacco: none.   Exercise: Walking, weight based exercises, 6 days/week.  Same  - over 150 minutes/week.  History Patient Active Problem List   Diagnosis Date Noted   Pneumonia 01/01/2021   CAP (community acquired pneumonia) 01/01/2021   Encounter for counseling 10/19/2020   Laryngopharyngeal reflux (  LPR) 05/02/2018   Stomatitis 05/02/2018   Tongue lesion 05/02/2018   Malignant neoplasm of upper-outer quadrant of right breast in female, estrogen receptor positive (HCC) 09/05/2017   Breast cancer, right breast (HCC) 03/08/2015   Dysuria 03/04/2014   Crohn disease (HCC) 08/01/2003   Past Medical History:  Diagnosis Date   Anemia    Arthritis    Atypical squamous cell changes of undetermined significance (ASCUS) on cervical cytology with negative high risk human papilloma virus (HPV) test result 2018   Breast cancer (HCC)    right   Cancer (HCC) 02/22/2006   rt breast cancer   Crohn's disease (HCC) dx 2004  Crohn's ileitis   04-14-2003 resection terminal ileum, cecum, and appendix   History  of breast cancer oncologist-  dr Charolett Copes---  no recurrence (lov 11-29-2016)   dx 04/ 2007 --  Right breast carcinoma , invasive DCIS, Stage IIB, Grade 2 (T1c N1) ER/PR positive, HER-2 negative--  03-12-2006  s/p  right lumpectomy w/ sln bx's and dissection --  adjuvant chemo and radiation therapy completed 01/ 2008-- completed tamoxifen  01/ 2018   History of cancer chemotherapy completed 01/ 2008   right breast   History of genital warts    as teenager--  per pt frozen and did not return   Hypertriglyceridemia    Osteopenia 2018   Osteoporosis    Personal history of chemotherapy    Personal history of radiation therapy completed 01/ 2008   right breast   PMB (postmenopausal bleeding) 06/07/2017   Shingles    Past Surgical History:  Procedure Laterality Date   ABDOMINOPLASTY [name] 12/21/2020   BREAST LUMPECTOMY Right 2007   COLONOSCOPY     COSMETIC SURGERY  12/2019   Mini abdominoplasty   D & C HYSTEROSCOPIC RESECTION POLYP  11-29-2009   dr romine   HYSTEROSCOPY WITH D & C [name] 06/14/2017   Procedure: DILATATION AND CURETTAGE Janice [name];  Surgeon: Greta Leatherwood, MD;  Location: [facility];  Service: Gynecology;  Laterality: [name];   LAPAROSCOPIC CHOLECYSTECTOMY  09/16/2002   PORT-A-CATH REMOVAL  01/03/2007   RESECTION TERMINAL ILEUM, CECUM, AND APPENDIX  04-14-2003   dr Alray Askew   Crohn's ileitis   RIGHT BREAST LUMPECTOMY W/ MULTIPLE AXILLARY SENTINAL NODE BX'S AND DISSECTION  03/12/2006   SMALL INTESTINE SURGERY     TRANSTHORACIC ECHOCARDIOGRAM  04/05/2006   ef 55-60%/  trivial MR and TR   Allergies  Allergen Reactions   Oxycodone-Acetaminophen  Other (See Comments)   Prior to Admission medications   Medication Sig Start Date End Date Taking? Authorizing Provider  Calcium Carb-Cholecalciferol 333-3.325 MG-MCG TABS Take 325 mg by mouth daily.   Yes [provider]  Cholecalciferol (VITAMIN D -3) 125 MCG (5000 UT) TABS Take 1 tablet by mouth See  admin instructions. Monday through Friday   Yes [provider]  Collagen-Vitamin C-Biotin (COLLAGEN 1500/C) 500-50-0.8 MG CAPS  03/20/22  Yes [provider]  hyoscyamine (LEVSIN) 0.125 MG tablet Take 0.125 mg by mouth once as needed for cramping.   Yes [provider]  Menaquinone-7 (VITAMIN K2) 100 MCG CAPS  03/21/23  Yes [provider]  Multiple Vitamin (MULTIVITAMIN) tablet Take 1 tablet by mouth daily.   Yes [provider]  Omega-3 1000 MG CAPS  11/20/22  Yes [provider]  Probiotic Product (ALIGN) 4 MG CAPS Take 1 capsule by mouth daily.   Yes [provider]   Social History   Socioeconomic History   Marital  status: Married    Spouse name: Not on file   Number of children: Not on file   Years of education: Not on file   Highest education level: Bachelor's degree (e.g., BA, AB, BS)  Occupational History   Not on file  Tobacco Use   Smoking status: Never   Smokeless tobacco: Never  Vaping Use   Vaping status: Never Used  Substance and Sexual Activity   Alcohol use: Not Currently   Drug use: Never   Sexual activity: Yes    Partners: Male    Birth control/protection: Post-menopausal    Comment: husband with vasectomy  Other Topics Concern   Not on file  Social History Narrative   Not on file   Social Drivers of Health   Financial Resource Strain: Low Risk  (03/11/2024)   Overall Financial Resource Strain (CARDIA)    Difficulty of Paying Living Expenses: Not hard at all  Food Insecurity: No Food Insecurity (03/11/2024)   Hunger Vital Sign    Worried About Running Out of Food in the Last Year: Never true    Ran Out of Food in the Last Year: Never true  Transportation Needs: No Transportation Needs (03/11/2024)   PRAPARE - Administrator, Civil Service (Medical): No    Lack of Transportation (Non-Medical): No  Physical Activity: Sufficiently Active (03/11/2024)   Exercise Vital Sign    Days of  Exercise per Week: 6 days    Minutes of Exercise per Session: 60 min  Stress: No Stress Concern Present (03/11/2024)   [employer] of Occupational Health - Occupational Stress Questionnaire    Feeling of Stress : Not at all  Social Connections: Moderately Integrated (03/11/2024)   Social Connection and Isolation Panel [NHANES]    Frequency of Communication with Friends and Family: Three times a week    Frequency of Social Gatherings with Friends and Family: Once a week    Attends Religious Services: 1 to 4 times per year    Active Member of [employer] or Organizations: No    Attends Engineer, structural: Not on file    Marital Status: Married  Catering manager Violence: Not on file    Review of Systems 13 point review of systems per patient health survey noted.  Negative other than as indicated above or in HPI.    Objective:   Vitals:   03/14/24 0925  BP: 118/72  Pulse: 74  Temp: 97.9 F (36.6 C)  TempSrc: Temporal  SpO2: 96%  Weight: 161 lb (73 kg)  Height: 5' 6.54" (1.69 m)     Physical Exam Constitutional:      Appearance: She is well-developed.  HENT:     Head: Normocephalic and atraumatic.     Right Ear: External ear normal.     Left Ear: External ear normal.  Eyes:     Conjunctiva/sclera: Conjunctivae normal.     Pupils: Pupils are equal, round, and reactive to light.  Neck:     Thyroid : No thyromegaly.  Cardiovascular:     Rate and Rhythm: Normal rate and regular rhythm.     Heart sounds: Normal heart sounds. No murmur heard. Pulmonary:     Effort: Pulmonary effort is normal. No respiratory distress.     Breath sounds: Normal breath sounds. No wheezing.  Abdominal:     General: Bowel sounds are normal.     Palpations: Abdomen is soft.     Tenderness: There is no abdominal tenderness.  Musculoskeletal:  General: No tenderness. Normal range of motion.     Cervical back: Normal range of motion and neck supple.  Lymphadenopathy:      Cervical: No cervical adenopathy.  Skin:    General: Skin is warm and dry.     Findings: No rash.  Neurological:     Mental Status: She is alert and oriented to person, place, and time.  Psychiatric:        Behavior: Behavior normal.        Thought Content: Thought content normal.        Assessment & Plan:  Janice [name] is a 58 y.o. female . Annual physical exam  Hair loss - Plan: TSH  History of anemia - Plan: CBC  History of osteoporosis - Plan: Comprehensive metabolic panel with GFR, Vitamin D  (25 hydroxy)  Screening, lipid - Plan: Comprehensive metabolic panel with GFR, Lipid panel  Crohn's disease of colon with complication (HCC) - Plan: CBC  -anticipatory guidance as below in AVS, screening labs above. Health maintenance items as above in HPI discussed/recommended as applicable.  - Recommend discussing follow-up colonoscopy interval with her gastroenterologist.  Keep follow-up with gynecology, dermatology as planned.  Screening labs as above with history of anemia, vitamin D  supplementation and osteoporosis, and prior hypertriglyceridemia.  History of hair loss treated by dermatology previously, repeat TSH, stable last year.  No new meds.  Vaccines discussed as above, declined.  Option of nurse visit later this year when Tdap due or can have done at her next physical.  No orders of the defined types were placed in this encounter.  Patient Instructions  Thank you for coming in today.  If any concerns on labs I will let you know.  If any questions from your visit or other concerns, please reach out.  As we discussed there is an option of a virtual visit if needed.  Have a great trip to the beach and take care!  Preventive Care 49-48 Years Old, Female Preventive care refers to lifestyle choices and visits with your health care provider that can promote health and wellness. Preventive care visits are also called wellness exams. What can I expect for my preventive care  visit? Counseling Your health care provider may ask you questions about your: Medical history, including: Past medical problems. Family medical history. Pregnancy history. Current health, including: Menstrual cycle. Method of birth control. Emotional well-being. Home life and relationship well-being. Sexual activity and sexual health. Lifestyle, including: Alcohol, nicotine or tobacco, and drug use. Access to firearms. Diet, exercise, and sleep habits. Work and work Astronomer. Sunscreen use. Safety issues such as seatbelt and bike helmet use. Physical exam Your health care provider will check your: Height and weight. These may be used to calculate your BMI (body mass index). BMI is a measurement that tells if you are at a healthy weight. Waist circumference. This measures the distance around your waistline. This measurement also tells if you are at a healthy weight and may help predict your risk of certain diseases, such as type 2 diabetes and high blood pressure. Heart rate and blood pressure. Body temperature. Skin for abnormal spots. What immunizations do I need?  Vaccines are usually given at various ages, according to a schedule. Your health care provider will recommend vaccines for you based on your age, medical history, and lifestyle or other factors, such as travel or where you work. What tests do I need? Screening Your health care provider may recommend screening tests for certain conditions. This  may include: Lipid and cholesterol levels. Diabetes screening. This is done by checking your blood sugar (glucose) after you have not eaten for a while (fasting). Pelvic exam and Pap test. Hepatitis B test. Hepatitis C test. HIV (human immunodeficiency virus) test. STI (sexually transmitted infection) testing, if you are at risk. Lung cancer screening. Colorectal cancer screening. Mammogram. Talk with your health care provider about when you should start having regular  mammograms. This may depend on whether you have a family history of breast cancer. BRCA-related cancer screening. This may be done if you have a family history of breast, ovarian, tubal, or peritoneal cancers. Bone density scan. This is done to screen for osteoporosis. Talk with your health care provider about your test results, treatment options, and if necessary, the need for more tests. Follow these instructions at home: Eating and drinking  Eat a diet that includes fresh fruits and vegetables, whole grains, lean protein, and low-fat dairy products. Take vitamin and mineral supplements as recommended by your health care provider. Do not drink alcohol if: Your health care provider tells you not to drink. You are pregnant, may be pregnant, or are planning to become pregnant. If you drink alcohol: Limit how much you have to 0-1 drink a day. Know how much alcohol is in your drink. In the [place] one drink equals one 12 oz bottle of beer (355 mL), one 5 oz glass of wine (148 mL), or one 1 oz glass of hard liquor (44 mL). Lifestyle Brush your teeth every morning and night with fluoride toothpaste. Floss one time each day. Exercise for at least 30 minutes 5 or more days each week. Do not use any products that contain nicotine or tobacco. These products include cigarettes, chewing tobacco, and vaping devices, such as e-cigarettes. If you need help quitting, ask your health care provider. Do not use drugs. If you are sexually active, practice safe sex. Use a condom or other form of protection to prevent STIs. If you do not wish to become pregnant, use a form of birth control. If you plan to become pregnant, see your health care provider for a prepregnancy visit. Take aspirin only as told by your health care provider. Make sure that you understand how much to take and what form to take. Work with your health care provider to find out whether it is safe and beneficial for you to take aspirin  daily. Find healthy ways to manage stress, such as: Meditation, yoga, or listening to music. Journaling. Talking to a trusted person. Spending time with friends and family. Minimize exposure to UV radiation to reduce your risk of skin cancer. Safety Always wear your seat belt while driving or riding in a vehicle. Do not drive: If you have been drinking alcohol. Do not ride with someone who has been drinking. When you are tired or distracted. While texting. If you have been using any mind-altering substances or drugs. Wear a helmet and other protective equipment during sports activities. If you have firearms in your house, make sure you follow all gun safety procedures. Seek help if you have been physically or sexually abused. What's next? Visit your health care provider once a year for an annual wellness visit. Ask your health care provider how often you should have your eyes and teeth checked. Stay up to date on all vaccines. This information is not intended to replace advice given to you by your health care provider. Make sure you discuss any questions you have with your health care  provider. Document Revised: 05/04/2021 Document Reviewed: 05/04/2021 Elsevier Patient Education  2024 Elsevier Inc.    Signed,   Caro Christmas, MD [facility] 03/14/24 10:24 AM

## 2024-03-18 ENCOUNTER — Encounter: Payer: Self-pay | Admitting: Family Medicine

## 2024-05-28 ENCOUNTER — Other Ambulatory Visit: Payer: BC Managed Care – PPO

## 2024-06-03 ENCOUNTER — Other Ambulatory Visit (HOSPITAL_BASED_OUTPATIENT_CLINIC_OR_DEPARTMENT_OTHER)

## 2024-07-15 ENCOUNTER — Other Ambulatory Visit: Payer: Self-pay | Admitting: Obstetrics and Gynecology

## 2024-07-15 DIAGNOSIS — Z1231 Encounter for screening mammogram for malignant neoplasm of breast: Secondary | ICD-10-CM

## 2024-07-31 DIAGNOSIS — L819 Disorder of pigmentation, unspecified: Secondary | ICD-10-CM | POA: Diagnosis not present

## 2024-07-31 DIAGNOSIS — D225 Melanocytic nevi of trunk: Secondary | ICD-10-CM | POA: Diagnosis not present

## 2024-07-31 DIAGNOSIS — L570 Actinic keratosis: Secondary | ICD-10-CM | POA: Diagnosis not present

## 2024-07-31 DIAGNOSIS — L814 Other melanin hyperpigmentation: Secondary | ICD-10-CM | POA: Diagnosis not present

## 2024-07-31 DIAGNOSIS — L821 Other seborrheic keratosis: Secondary | ICD-10-CM | POA: Diagnosis not present

## 2024-09-25 ENCOUNTER — Ambulatory Visit
Admission: RE | Admit: 2024-09-25 | Discharge: 2024-09-25 | Disposition: A | Discharge status: Home / Self Care | Source: Ambulatory Visit | Attending: Obstetrics and Gynecology | Admitting: Obstetrics and Gynecology

## 2024-09-25 DIAGNOSIS — Z1231 Encounter for screening mammogram for malignant neoplasm of breast: Secondary | ICD-10-CM | POA: Diagnosis not present

## 2024-09-29 ENCOUNTER — Ambulatory Visit: Payer: Self-pay | Admitting: Obstetrics and Gynecology

## 2024-10-23 ENCOUNTER — Ambulatory Visit (HOSPITAL_BASED_OUTPATIENT_CLINIC_OR_DEPARTMENT_OTHER)
Admission: RE | Admit: 2024-10-23 | Discharge: 2024-10-23 | Disposition: A | Source: Ambulatory Visit | Attending: Obstetrics and Gynecology | Admitting: Obstetrics and Gynecology

## 2024-10-23 DIAGNOSIS — M810 Age-related osteoporosis without current pathological fracture: Secondary | ICD-10-CM | POA: Diagnosis not present

## 2024-10-25 ENCOUNTER — Ambulatory Visit: Payer: Self-pay | Admitting: Obstetrics and Gynecology

## 2024-10-25 ENCOUNTER — Encounter: Payer: Self-pay | Admitting: Obstetrics and Gynecology

## 2025-01-14 ENCOUNTER — Ambulatory Visit: Admitting: Obstetrics and Gynecology

## 2025-01-21 ENCOUNTER — Ambulatory Visit: Admitting: Obstetrics and Gynecology

## 2025-03-18 ENCOUNTER — Encounter: Admitting: Family Medicine
